# Patient Record
Sex: Male | Born: 1945 | Race: White | Hispanic: No | Marital: Married | Smoking: Former smoker
Health system: Southern US, Community
[De-identification: ages and names within clinical notes are randomized; demographics above are authoritative.]

## PROBLEM LIST (undated history)

## (undated) DIAGNOSIS — H532 Diplopia: Secondary | ICD-10-CM

## (undated) DIAGNOSIS — J189 Pneumonia, unspecified organism: Secondary | ICD-10-CM

## (undated) DIAGNOSIS — K219 Gastro-esophageal reflux disease without esophagitis: Secondary | ICD-10-CM

## (undated) DIAGNOSIS — E785 Hyperlipidemia, unspecified: Secondary | ICD-10-CM

## (undated) DIAGNOSIS — I10 Essential (primary) hypertension: Secondary | ICD-10-CM

## (undated) DIAGNOSIS — M1990 Unspecified osteoarthritis, unspecified site: Secondary | ICD-10-CM

## (undated) DIAGNOSIS — H269 Unspecified cataract: Secondary | ICD-10-CM

## (undated) DIAGNOSIS — H348312 Tributary (branch) retinal vein occlusion, right eye, stable: Secondary | ICD-10-CM

## (undated) DIAGNOSIS — G459 Transient cerebral ischemic attack, unspecified: Secondary | ICD-10-CM

## (undated) HISTORY — DX: Gastro-esophageal reflux disease without esophagitis: K21.9

## (undated) HISTORY — DX: Transient cerebral ischemic attack, unspecified: G45.9

## (undated) HISTORY — PX: SMALL INTESTINE SURGERY: SHX150

## (undated) HISTORY — PX: APPENDECTOMY: SHX54

## (undated) HISTORY — PX: CARDIAC CATHETERIZATION: SHX172

## (undated) HISTORY — DX: Unspecified cataract: H26.9

## (undated) HISTORY — PX: INGUINAL HERNIA REPAIR: SUR1180

## (undated) HISTORY — PX: CATARACT EXTRACTION: SUR2

## (undated) HISTORY — DX: Diplopia: H53.2

## (undated) HISTORY — PX: SPINE SURGERY: SHX786

## (undated) HISTORY — DX: Tributary (branch) retinal vein occlusion, right eye, stable: H34.8312

## (undated) HISTORY — PX: TONSILLECTOMY: SUR1361

## (undated) HISTORY — DX: Hyperlipidemia, unspecified: E78.5

## (undated) HISTORY — DX: Essential (primary) hypertension: I10

## (undated) HISTORY — PX: EYE SURGERY: SHX253

---
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
## 2008-01-29 DIAGNOSIS — Z87442 Personal history of urinary calculi: Secondary | ICD-10-CM | POA: Insufficient documentation

## 2008-01-29 HISTORY — DX: Personal history of urinary calculi: Z87.442

## 2013-12-07 HISTORY — PX: CARDIAC CATHETERIZATION: SHX172

## 2015-03-01 DIAGNOSIS — Z23 Encounter for immunization: Secondary | ICD-10-CM | POA: Diagnosis not present

## 2015-04-18 DIAGNOSIS — Z125 Encounter for screening for malignant neoplasm of prostate: Secondary | ICD-10-CM | POA: Diagnosis not present

## 2015-04-28 DIAGNOSIS — S20219A Contusion of unspecified front wall of thorax, initial encounter: Secondary | ICD-10-CM | POA: Diagnosis not present

## 2015-05-19 DIAGNOSIS — I10 Essential (primary) hypertension: Secondary | ICD-10-CM

## 2015-05-19 DIAGNOSIS — K219 Gastro-esophageal reflux disease without esophagitis: Secondary | ICD-10-CM

## 2015-05-19 DIAGNOSIS — Z566 Other physical and mental strain related to work: Secondary | ICD-10-CM | POA: Diagnosis not present

## 2015-05-19 DIAGNOSIS — M4806 Spinal stenosis, lumbar region: Secondary | ICD-10-CM | POA: Diagnosis not present

## 2015-05-19 HISTORY — DX: Essential (primary) hypertension: I10

## 2015-05-19 HISTORY — DX: Gastro-esophageal reflux disease without esophagitis: K21.9

## 2015-09-20 DIAGNOSIS — M19072 Primary osteoarthritis, left ankle and foot: Secondary | ICD-10-CM | POA: Diagnosis not present

## 2015-09-20 DIAGNOSIS — M7062 Trochanteric bursitis, left hip: Secondary | ICD-10-CM | POA: Diagnosis not present

## 2015-11-20 DIAGNOSIS — N200 Calculus of kidney: Secondary | ICD-10-CM | POA: Diagnosis not present

## 2015-11-20 DIAGNOSIS — Z23 Encounter for immunization: Secondary | ICD-10-CM | POA: Diagnosis not present

## 2015-11-20 DIAGNOSIS — Z733 Stress, not elsewhere classified: Secondary | ICD-10-CM | POA: Diagnosis not present

## 2015-11-20 DIAGNOSIS — K219 Gastro-esophageal reflux disease without esophagitis: Secondary | ICD-10-CM | POA: Diagnosis not present

## 2015-11-20 DIAGNOSIS — I10 Essential (primary) hypertension: Secondary | ICD-10-CM | POA: Diagnosis not present

## 2015-12-26 DIAGNOSIS — J209 Acute bronchitis, unspecified: Secondary | ICD-10-CM | POA: Diagnosis not present

## 2016-03-22 DIAGNOSIS — Z125 Encounter for screening for malignant neoplasm of prostate: Secondary | ICD-10-CM | POA: Diagnosis not present

## 2016-03-22 DIAGNOSIS — I10 Essential (primary) hypertension: Secondary | ICD-10-CM | POA: Diagnosis not present

## 2016-05-03 DIAGNOSIS — M25562 Pain in left knee: Secondary | ICD-10-CM | POA: Diagnosis not present

## 2016-05-06 DIAGNOSIS — M25562 Pain in left knee: Secondary | ICD-10-CM | POA: Diagnosis not present

## 2016-05-20 DIAGNOSIS — R310 Gross hematuria: Secondary | ICD-10-CM | POA: Diagnosis not present

## 2016-05-20 DIAGNOSIS — N3001 Acute cystitis with hematuria: Secondary | ICD-10-CM | POA: Diagnosis not present

## 2016-05-22 DIAGNOSIS — E278 Other specified disorders of adrenal gland: Secondary | ICD-10-CM | POA: Diagnosis not present

## 2016-05-22 DIAGNOSIS — I517 Cardiomegaly: Secondary | ICD-10-CM | POA: Diagnosis not present

## 2016-05-22 DIAGNOSIS — M5136 Other intervertebral disc degeneration, lumbar region: Secondary | ICD-10-CM | POA: Diagnosis not present

## 2016-05-22 DIAGNOSIS — I700 Atherosclerosis of aorta: Secondary | ICD-10-CM | POA: Diagnosis not present

## 2016-05-22 DIAGNOSIS — R310 Gross hematuria: Secondary | ICD-10-CM | POA: Diagnosis not present

## 2016-05-22 DIAGNOSIS — M47897 Other spondylosis, lumbosacral region: Secondary | ICD-10-CM | POA: Diagnosis not present

## 2016-05-22 DIAGNOSIS — M47896 Other spondylosis, lumbar region: Secondary | ICD-10-CM | POA: Diagnosis not present

## 2016-05-22 DIAGNOSIS — M5137 Other intervertebral disc degeneration, lumbosacral region: Secondary | ICD-10-CM | POA: Diagnosis not present

## 2016-05-27 DIAGNOSIS — N3021 Other chronic cystitis with hematuria: Secondary | ICD-10-CM | POA: Diagnosis not present

## 2016-05-27 DIAGNOSIS — R310 Gross hematuria: Secondary | ICD-10-CM | POA: Diagnosis not present

## 2016-06-18 DIAGNOSIS — N3001 Acute cystitis with hematuria: Secondary | ICD-10-CM | POA: Diagnosis not present

## 2016-06-18 DIAGNOSIS — R310 Gross hematuria: Secondary | ICD-10-CM | POA: Diagnosis not present

## 2016-06-18 DIAGNOSIS — N318 Other neuromuscular dysfunction of bladder: Secondary | ICD-10-CM | POA: Diagnosis not present

## 2016-06-18 DIAGNOSIS — N410 Acute prostatitis: Secondary | ICD-10-CM | POA: Diagnosis not present

## 2016-07-02 DIAGNOSIS — N3001 Acute cystitis with hematuria: Secondary | ICD-10-CM | POA: Diagnosis not present

## 2016-07-02 DIAGNOSIS — R310 Gross hematuria: Secondary | ICD-10-CM | POA: Diagnosis not present

## 2016-07-29 DIAGNOSIS — M79675 Pain in left toe(s): Secondary | ICD-10-CM | POA: Diagnosis not present

## 2016-09-26 DIAGNOSIS — I10 Essential (primary) hypertension: Secondary | ICD-10-CM | POA: Diagnosis not present

## 2016-09-26 DIAGNOSIS — K219 Gastro-esophageal reflux disease without esophagitis: Secondary | ICD-10-CM | POA: Diagnosis not present

## 2016-09-26 DIAGNOSIS — Z9181 History of falling: Secondary | ICD-10-CM | POA: Diagnosis not present

## 2016-09-26 DIAGNOSIS — Z1389 Encounter for screening for other disorder: Secondary | ICD-10-CM | POA: Diagnosis not present

## 2016-10-09 DIAGNOSIS — R2231 Localized swelling, mass and lump, right upper limb: Secondary | ICD-10-CM | POA: Diagnosis not present

## 2016-10-16 DIAGNOSIS — R2231 Localized swelling, mass and lump, right upper limb: Secondary | ICD-10-CM | POA: Diagnosis not present

## 2016-10-21 DIAGNOSIS — K219 Gastro-esophageal reflux disease without esophagitis: Secondary | ICD-10-CM | POA: Diagnosis not present

## 2016-11-04 DIAGNOSIS — H348312 Tributary (branch) retinal vein occlusion, right eye, stable: Secondary | ICD-10-CM | POA: Diagnosis not present

## 2016-11-04 DIAGNOSIS — H3589 Other specified retinal disorders: Secondary | ICD-10-CM | POA: Diagnosis not present

## 2016-11-04 DIAGNOSIS — H25813 Combined forms of age-related cataract, bilateral: Secondary | ICD-10-CM | POA: Diagnosis not present

## 2016-11-04 DIAGNOSIS — Z961 Presence of intraocular lens: Secondary | ICD-10-CM | POA: Diagnosis not present

## 2016-11-04 DIAGNOSIS — H25812 Combined forms of age-related cataract, left eye: Secondary | ICD-10-CM | POA: Diagnosis not present

## 2016-11-04 DIAGNOSIS — Z9841 Cataract extraction status, right eye: Secondary | ICD-10-CM | POA: Diagnosis not present

## 2016-11-15 DIAGNOSIS — Z6832 Body mass index (BMI) 32.0-32.9, adult: Secondary | ICD-10-CM | POA: Diagnosis not present

## 2016-11-15 DIAGNOSIS — Z125 Encounter for screening for malignant neoplasm of prostate: Secondary | ICD-10-CM | POA: Diagnosis not present

## 2016-11-15 DIAGNOSIS — I10 Essential (primary) hypertension: Secondary | ICD-10-CM | POA: Diagnosis not present

## 2016-11-15 DIAGNOSIS — Z1339 Encounter for screening examination for other mental health and behavioral disorders: Secondary | ICD-10-CM | POA: Diagnosis not present

## 2016-11-15 DIAGNOSIS — K219 Gastro-esophageal reflux disease without esophagitis: Secondary | ICD-10-CM | POA: Diagnosis not present

## 2016-11-15 DIAGNOSIS — Z79899 Other long term (current) drug therapy: Secondary | ICD-10-CM | POA: Diagnosis not present

## 2016-11-15 DIAGNOSIS — Z23 Encounter for immunization: Secondary | ICD-10-CM | POA: Diagnosis not present

## 2016-11-22 DIAGNOSIS — Z136 Encounter for screening for cardiovascular disorders: Secondary | ICD-10-CM | POA: Diagnosis not present

## 2016-11-22 DIAGNOSIS — Z8249 Family history of ischemic heart disease and other diseases of the circulatory system: Secondary | ICD-10-CM | POA: Diagnosis not present

## 2016-11-22 DIAGNOSIS — Z720 Tobacco use: Secondary | ICD-10-CM | POA: Diagnosis not present

## 2016-11-22 DIAGNOSIS — Z87891 Personal history of nicotine dependence: Secondary | ICD-10-CM | POA: Diagnosis not present

## 2016-11-26 DIAGNOSIS — Z881 Allergy status to other antibiotic agents status: Secondary | ICD-10-CM | POA: Diagnosis not present

## 2016-11-26 DIAGNOSIS — H02831 Dermatochalasis of right upper eyelid: Secondary | ICD-10-CM | POA: Diagnosis not present

## 2016-11-26 DIAGNOSIS — Z880 Allergy status to penicillin: Secondary | ICD-10-CM | POA: Diagnosis not present

## 2016-11-26 DIAGNOSIS — Z79899 Other long term (current) drug therapy: Secondary | ICD-10-CM | POA: Diagnosis not present

## 2016-11-26 DIAGNOSIS — I10 Essential (primary) hypertension: Secondary | ICD-10-CM | POA: Diagnosis not present

## 2016-11-26 DIAGNOSIS — Z9841 Cataract extraction status, right eye: Secondary | ICD-10-CM | POA: Diagnosis not present

## 2016-11-26 DIAGNOSIS — H43813 Vitreous degeneration, bilateral: Secondary | ICD-10-CM | POA: Diagnosis not present

## 2016-11-26 DIAGNOSIS — H348312 Tributary (branch) retinal vein occlusion, right eye, stable: Secondary | ICD-10-CM | POA: Diagnosis not present

## 2016-11-26 DIAGNOSIS — H02834 Dermatochalasis of left upper eyelid: Secondary | ICD-10-CM | POA: Diagnosis not present

## 2016-11-26 DIAGNOSIS — H25012 Cortical age-related cataract, left eye: Secondary | ICD-10-CM | POA: Diagnosis not present

## 2016-11-26 DIAGNOSIS — H25813 Combined forms of age-related cataract, bilateral: Secondary | ICD-10-CM | POA: Diagnosis not present

## 2016-11-26 DIAGNOSIS — Z7982 Long term (current) use of aspirin: Secondary | ICD-10-CM | POA: Diagnosis not present

## 2016-11-26 DIAGNOSIS — Z87891 Personal history of nicotine dependence: Secondary | ICD-10-CM | POA: Diagnosis not present

## 2016-11-26 DIAGNOSIS — Z961 Presence of intraocular lens: Secondary | ICD-10-CM | POA: Diagnosis not present

## 2016-12-03 DIAGNOSIS — Z9049 Acquired absence of other specified parts of digestive tract: Secondary | ICD-10-CM | POA: Diagnosis not present

## 2016-12-03 DIAGNOSIS — K635 Polyp of colon: Secondary | ICD-10-CM | POA: Diagnosis not present

## 2016-12-03 DIAGNOSIS — K5730 Diverticulosis of large intestine without perforation or abscess without bleeding: Secondary | ICD-10-CM | POA: Diagnosis not present

## 2016-12-03 DIAGNOSIS — Z86010 Personal history of colonic polyps: Secondary | ICD-10-CM | POA: Diagnosis not present

## 2016-12-03 DIAGNOSIS — K648 Other hemorrhoids: Secondary | ICD-10-CM | POA: Diagnosis not present

## 2016-12-03 DIAGNOSIS — D126 Benign neoplasm of colon, unspecified: Secondary | ICD-10-CM | POA: Diagnosis not present

## 2016-12-03 DIAGNOSIS — Z79899 Other long term (current) drug therapy: Secondary | ICD-10-CM | POA: Diagnosis not present

## 2016-12-03 DIAGNOSIS — Z1211 Encounter for screening for malignant neoplasm of colon: Secondary | ICD-10-CM | POA: Diagnosis not present

## 2016-12-03 DIAGNOSIS — I10 Essential (primary) hypertension: Secondary | ICD-10-CM | POA: Diagnosis not present

## 2016-12-03 DIAGNOSIS — K644 Residual hemorrhoidal skin tags: Secondary | ICD-10-CM | POA: Diagnosis not present

## 2017-01-07 DIAGNOSIS — J209 Acute bronchitis, unspecified: Secondary | ICD-10-CM | POA: Diagnosis not present

## 2017-01-07 DIAGNOSIS — J069 Acute upper respiratory infection, unspecified: Secondary | ICD-10-CM | POA: Diagnosis not present

## 2017-01-07 DIAGNOSIS — G4700 Insomnia, unspecified: Secondary | ICD-10-CM | POA: Diagnosis not present

## 2017-01-07 DIAGNOSIS — Z6832 Body mass index (BMI) 32.0-32.9, adult: Secondary | ICD-10-CM | POA: Diagnosis not present

## 2017-01-14 DIAGNOSIS — I10 Essential (primary) hypertension: Secondary | ICD-10-CM | POA: Diagnosis not present

## 2017-01-14 DIAGNOSIS — Z6831 Body mass index (BMI) 31.0-31.9, adult: Secondary | ICD-10-CM | POA: Diagnosis not present

## 2017-01-14 DIAGNOSIS — J209 Acute bronchitis, unspecified: Secondary | ICD-10-CM | POA: Diagnosis not present

## 2017-02-12 DIAGNOSIS — D179 Benign lipomatous neoplasm, unspecified: Secondary | ICD-10-CM | POA: Diagnosis not present

## 2017-02-12 DIAGNOSIS — R2231 Localized swelling, mass and lump, right upper limb: Secondary | ICD-10-CM | POA: Diagnosis not present

## 2017-03-10 DIAGNOSIS — K219 Gastro-esophageal reflux disease without esophagitis: Secondary | ICD-10-CM | POA: Diagnosis not present

## 2017-03-10 DIAGNOSIS — I10 Essential (primary) hypertension: Secondary | ICD-10-CM | POA: Diagnosis not present

## 2017-03-10 DIAGNOSIS — Z7982 Long term (current) use of aspirin: Secondary | ICD-10-CM | POA: Diagnosis not present

## 2017-03-10 DIAGNOSIS — D179 Benign lipomatous neoplasm, unspecified: Secondary | ICD-10-CM | POA: Diagnosis not present

## 2017-03-10 DIAGNOSIS — Z79899 Other long term (current) drug therapy: Secondary | ICD-10-CM | POA: Diagnosis not present

## 2017-03-10 DIAGNOSIS — Z6830 Body mass index (BMI) 30.0-30.9, adult: Secondary | ICD-10-CM | POA: Diagnosis not present

## 2017-03-10 DIAGNOSIS — D1721 Benign lipomatous neoplasm of skin and subcutaneous tissue of right arm: Secondary | ICD-10-CM | POA: Diagnosis not present

## 2017-03-10 DIAGNOSIS — Z87891 Personal history of nicotine dependence: Secondary | ICD-10-CM | POA: Diagnosis not present

## 2017-03-10 DIAGNOSIS — E669 Obesity, unspecified: Secondary | ICD-10-CM | POA: Diagnosis not present

## 2017-05-07 DIAGNOSIS — Z9841 Cataract extraction status, right eye: Secondary | ICD-10-CM | POA: Diagnosis not present

## 2017-05-07 DIAGNOSIS — H348312 Tributary (branch) retinal vein occlusion, right eye, stable: Secondary | ICD-10-CM | POA: Diagnosis not present

## 2017-05-07 DIAGNOSIS — H25812 Combined forms of age-related cataract, left eye: Secondary | ICD-10-CM | POA: Diagnosis not present

## 2017-05-07 DIAGNOSIS — Z961 Presence of intraocular lens: Secondary | ICD-10-CM | POA: Diagnosis not present

## 2017-05-07 DIAGNOSIS — Z9842 Cataract extraction status, left eye: Secondary | ICD-10-CM | POA: Diagnosis not present

## 2017-05-08 DIAGNOSIS — R002 Palpitations: Secondary | ICD-10-CM | POA: Diagnosis not present

## 2017-05-08 DIAGNOSIS — R9431 Abnormal electrocardiogram [ECG] [EKG]: Secondary | ICD-10-CM | POA: Diagnosis not present

## 2017-05-08 DIAGNOSIS — Z6831 Body mass index (BMI) 31.0-31.9, adult: Secondary | ICD-10-CM | POA: Diagnosis not present

## 2017-05-12 ENCOUNTER — Encounter: Payer: Self-pay | Admitting: Cardiology

## 2017-05-12 ENCOUNTER — Ambulatory Visit (INDEPENDENT_AMBULATORY_CARE_PROVIDER_SITE_OTHER): Payer: Medicare Other | Admitting: Cardiology

## 2017-05-12 VITALS — BP 134/90 | HR 74 | Ht 72.0 in | Wt 233.4 lb

## 2017-05-12 DIAGNOSIS — R0789 Other chest pain: Secondary | ICD-10-CM

## 2017-05-12 DIAGNOSIS — R9431 Abnormal electrocardiogram [ECG] [EKG]: Secondary | ICD-10-CM | POA: Diagnosis not present

## 2017-05-12 DIAGNOSIS — R002 Palpitations: Secondary | ICD-10-CM | POA: Diagnosis not present

## 2017-05-12 DIAGNOSIS — I10 Essential (primary) hypertension: Secondary | ICD-10-CM | POA: Diagnosis not present

## 2017-05-12 HISTORY — DX: Abnormal electrocardiogram (ECG) (EKG): R94.31

## 2017-05-12 HISTORY — DX: Other chest pain: R07.89

## 2017-05-12 HISTORY — DX: Palpitations: R00.2

## 2017-05-12 NOTE — Patient Instructions (Signed)
Medication Instructions:  Your physician recommends that you continue on your current medications as directed. Please refer to the Current Medication list given to you today.   Labwork: None  Testing/Procedures: Your physician has requested that you have an echocardiogram. Echocardiography is a painless test that uses sound waves to create images of your heart. It provides your doctor with information about the size and shape of your heart and how well your heart's chambers and valves are working. This procedure takes approximately one hour. There are no restrictions for this procedure.  Your physician has recommended that you wear a holter monitor. Holter monitors are medical devices that record the heart's electrical activity. Doctors most often use these monitors to diagnose arrhythmias. Arrhythmias are problems with the speed or rhythm of the heartbeat. The monitor is a small, portable device. You can wear one while you do your normal daily activities. This is usually used to diagnose what is causing palpitations/syncope (passing out). Wear for 24 hours.   Follow-Up: Your physician recommends that you schedule a follow-up appointment in: 1 month.   Any Other Special Instructions Will Be Listed Below (If Applicable).     If you need a refill on your cardiac medications before your next appointment, please call your pharmacy.

## 2017-05-12 NOTE — Progress Notes (Signed)
Cardiology Consultation:    Date:  05/12/2017   ID:  Clarence Hill, DOB [birth year], MRN [id number]  PCP:  Angelina Sheriff, MD  Cardiologist:  Jenne Campus, MD   Referring MD: No ref. provider found   Chief Complaint  Patient presents with  . Abnormal ECG  I have palpitations and abnormal EKG  History of Present Illness:    Clarence Hill is a 72 y.o. male who is being seen today for the evaluation of palpitations and abnormal EKG at the request of No ref. provider found.  For last few weeks he experienced palpitations what he means by that he can feel hesitation in his heart beating and then after that he had very strong, powerful beat.  That happens typically at evening time he is trying to relax he does not have any chest pain no tightness squeezing pressure burning chest but he feels somewhat uncomfortable in the chest.  He did have quite extensive cardiac workup done about 3 years ago that included cardiac catheterization he said it was okay he is not sure if his cardiac catheterization was normal or if he did have some luminal disease.  He does not exercise on the regular basis he is busy after he retired about a month ago.  Used to go to gym however lately does not have time to do it.  Does not snore at night.  Does have borderline hypertension.  His cholesterol he is telling me that his LDL is usually above 60.  He used to smoke but quit smoking many years ago when his wife was pregnant with the first child.  He does have family history of premature coronary disease no dizziness no syncope  Past Medical History:  Diagnosis Date  . GERD (gastroesophageal reflux disease)   . Hypertension       Current Medications: Current Meds  Medication Sig  . aspirin EC 81 MG tablet Take 1 tablet by mouth daily.  Marland Kitchen losartan-hydrochlorothiazide (HYZAAR) 100-25 MG tablet Take 1 tablet by mouth daily.  Marland Kitchen omeprazole (PRILOSEC) 40 MG capsule Take 1 capsule by mouth daily.  . vitamin  B-12 (CYANOCOBALAMIN) 1000 MCG tablet Take 1 tablet by mouth 2 (two) times daily.  Marland Kitchen zolpidem (AMBIEN) 10 MG tablet Take 1 tablet by mouth at bedtime as needed.     Allergies:   Levofloxacin and Penicillin g   Social History   Socioeconomic History  . Marital status: Unknown    Spouse name: Not on file  . Number of children: Not on file  . Years of education: Not on file  . Highest education level: Not on file  Occupational History  . Not on file  Social Needs  . Financial resource strain: Not on file  . Food insecurity:    Worry: Not on file    Inability: Not on file  . Transportation needs:    Medical: Not on file    Non-medical: Not on file  Tobacco Use  . Smoking status: Former Research scientist (life sciences)  . Smokeless tobacco: Never Used  Substance and Sexual Activity  . Alcohol use: Yes  . Drug use: Not on file  . Sexual activity: Not on file  Lifestyle  . Physical activity:    Days per week: Not on file    Minutes per session: Not on file  . Stress: Not on file  Relationships  . Social connections:    Talks on phone: Not on file    Gets together: Not on file  Attends religious service: Not on file    Active member of club or organization: Not on file    Attends meetings of clubs or organizations: Not on file    Relationship status: Not on file  Other Topics Concern  . Not on file  Social History Narrative  . Not on file     Family History: The patient's family history includes CAD in his father; Thyroid cancer in his mother. ROS:   Please see the history of present illness.    All 14 point review of systems negative except as described per history of present illness.  EKGs/Labs/Other Studies Reviewed:    The following studies were reviewed today: EKG done by primary care physicians on normal sinus rhythm, left axis deviation, nonspecific ST segment changes   Recent Labs: No results found for requested labs within last 8760 hours.  Recent Lipid Panel No results  found for: CHOL, TRIG, HDL, CHOLHDL, VLDL, LDLCALC, LDLDIRECT  Physical Exam:    VS:  BP 134/90   Pulse 74   Ht 6' (1.829 m)   Wt 233 lb 6.4 oz (105.9 kg)   SpO2 95%   BMI 31.65 kg/m     Wt Readings from Last 3 Encounters:  05/12/17 233 lb 6.4 oz (105.9 kg)     GEN:  Well nourished, well developed in no acute distress HEENT: Normal NECK: No JVD; No carotid bruits LYMPHATICS: No lymphadenopathy CARDIAC: RRR, no murmurs, no rubs, no gallops RESPIRATORY:  Clear to auscultation without rales, wheezing or rhonchi  ABDOMEN: Soft, non-tender, non-distended MUSCULOSKELETAL:  No edema; No deformity  SKIN: Warm and dry NEUROLOGIC:  Alert and oriented x 3 PSYCHIATRIC:  Normal affect   ASSESSMENT:    1. Benign essential hypertension   2. Palpitations   3. Atypical chest pain   4. Abnormal EKG    PLAN:    In order of problems listed above:  1. Benign essential hypertension.  This is something we will get a watch.  Today blood pressure is acceptable. 2. Palpitations: I suspect he got extrasystole.  I will schedule him to have Holter monitor for 24 hours to see how much and what kind of extrasystole he is experiencing.  As a part of evaluation I will ask him to have echocardiogram. 3. Atypical chest pain: We will try to get cardiac catheterization that was done 2-3 years ago.  Based on that as well as based on the results of his Holter and echocardiogram will make decision regarding CAD evaluation. 4. Abnormal EKG.  We will do echocardiogram.  I will not initiate any medications for now.  His medication appropriate for this clinical scenario.  We will see him back in about a month   Medication Adjustments/Labs and Tests Ordered: Current medicines are reviewed at length with the patient today.  Concerns regarding medicines are outlined above.  No orders of the defined types were placed in this encounter.  No orders of the defined types were placed in this  encounter.   Signed, Park Liter, MD, [facility]. 05/12/2017 5:00 PM    [place]

## 2017-06-04 ENCOUNTER — Ambulatory Visit: Payer: Medicare Other

## 2017-06-04 ENCOUNTER — Ambulatory Visit (HOSPITAL_BASED_OUTPATIENT_CLINIC_OR_DEPARTMENT_OTHER)
Admission: RE | Admit: 2017-06-04 | Discharge: 2017-06-04 | Disposition: A | Discharge status: Home / Self Care | Payer: Medicare Other | Source: Ambulatory Visit | Attending: Cardiology | Admitting: Cardiology

## 2017-06-04 DIAGNOSIS — R002 Palpitations: Secondary | ICD-10-CM | POA: Insufficient documentation

## 2017-06-04 DIAGNOSIS — I351 Nonrheumatic aortic (valve) insufficiency: Secondary | ICD-10-CM | POA: Diagnosis not present

## 2017-06-04 DIAGNOSIS — R0789 Other chest pain: Secondary | ICD-10-CM | POA: Insufficient documentation

## 2017-06-04 DIAGNOSIS — R9431 Abnormal electrocardiogram [ECG] [EKG]: Secondary | ICD-10-CM | POA: Insufficient documentation

## 2017-06-04 DIAGNOSIS — I10 Essential (primary) hypertension: Secondary | ICD-10-CM | POA: Insufficient documentation

## 2017-06-04 NOTE — Progress Notes (Signed)
  Echocardiogram 2D Echocardiogram has been performed.  Clarence Hill T Clarence Hill 06/04/2017, 12:18 PM

## 2017-06-06 ENCOUNTER — Other Ambulatory Visit (HOSPITAL_BASED_OUTPATIENT_CLINIC_OR_DEPARTMENT_OTHER): Payer: Medicare Other

## 2017-06-16 ENCOUNTER — Ambulatory Visit: Payer: Medicare Other | Admitting: Cardiology

## 2017-06-17 ENCOUNTER — Encounter: Payer: Self-pay | Admitting: Cardiology

## 2017-06-17 ENCOUNTER — Ambulatory Visit (INDEPENDENT_AMBULATORY_CARE_PROVIDER_SITE_OTHER): Payer: Medicare Other | Admitting: Cardiology

## 2017-06-17 VITALS — BP 126/74 | HR 92 | Ht 72.0 in | Wt 231.8 lb

## 2017-06-17 DIAGNOSIS — I10 Essential (primary) hypertension: Secondary | ICD-10-CM | POA: Diagnosis not present

## 2017-06-17 DIAGNOSIS — R0789 Other chest pain: Secondary | ICD-10-CM

## 2017-06-17 DIAGNOSIS — R002 Palpitations: Secondary | ICD-10-CM

## 2017-06-17 DIAGNOSIS — K219 Gastro-esophageal reflux disease without esophagitis: Secondary | ICD-10-CM

## 2017-06-17 NOTE — Patient Instructions (Signed)
Medication Instructions:  Your physician recommends that you continue on your current medications as directed. Please refer to the Current Medication list given to you today.   Labwork: None  Testing/Procedures: Your physician has requested that you have a stress echocardiogram. For further information please visit [email or web address]. Please follow instruction sheet as given.   Follow-Up: Your physician wants you to follow-up in: 3 months. You will receive a reminder letter in the mail two months in advance. If you don't receive a letter, please call our office to schedule the follow-up appointment.   If you need a refill on your cardiac medications before your next appointment, please call your pharmacy.   Thank you for choosing [facility]! Robyne Peers, RN [phone]

## 2017-06-17 NOTE — Progress Notes (Signed)
Cardiology Office Note:    Date:  06/17/2017   ID:  Clarence Hill, DOB [birth year], MRN [id number]  PCP:  Clarence Sheriff, MD  Cardiologist:  Clarence Campus, MD    Referring MD: Clarence Sheriff, MD   Chief Complaint  Patient presents with  . 1 month follow up  Doing well  History of Present Illness:    Clarence Hill is a 72 y.o. male with hypertension, palpitations comes for follow-up.  Did wear Holter monitor which shows 75 premature ventricular beats no sustained arrhythmia no couplets and triplets.  Still feels some palpitations but does not bother him too much.  Echocardiogram was done which showed preserved left ventricular ejection fraction.  Interestingly when he wear Holter monitor at the heart fastest heart rate And at 7:00 in the morning he did have some ST segment depressions.  There is no chest pain tightness squeezing pressure burning chest however changes on EKG identified on the Holter and worrisome.  I will schedule him to have stress echocardiogram to rule out significant ischemia.  He tells me again that he had cardiac catheterization done before which showed normal coronaries still I do not have report of it we will try to retrieve it.  Past Medical History:  Diagnosis Date  . GERD (gastroesophageal reflux disease)   . Hypertension       Current Medications: Current Meds  Medication Sig  . aspirin EC 81 MG tablet Take 1 tablet by mouth daily.  Marland Kitchen losartan-hydrochlorothiazide (HYZAAR) 100-25 MG tablet Take 1 tablet by mouth daily.  Marland Kitchen omeprazole (PRILOSEC) 40 MG capsule Take 1 capsule by mouth daily.  . vitamin B-12 (CYANOCOBALAMIN) 1000 MCG tablet Take 1 tablet by mouth 2 (two) times daily.  Marland Kitchen zolpidem (AMBIEN) 10 MG tablet Take 1 tablet by mouth at bedtime as needed.     Allergies:   Levofloxacin and Penicillin g   Social History   Socioeconomic History  . Marital status: Married    Spouse name: Not on file  . Number of children: Not on file    . Years of education: Not on file  . Highest education level: Not on file  Occupational History  . Not on file  Social Needs  . Financial resource strain: Not on file  . Food insecurity:    Worry: Not on file    Inability: Not on file  . Transportation needs:    Medical: Not on file    Non-medical: Not on file  Tobacco Use  . Smoking status: Former Research scientist (life sciences)  . Smokeless tobacco: Never Used  Substance and Sexual Activity  . Alcohol use: Yes  . Drug use: Not on file  . Sexual activity: Not on file  Lifestyle  . Physical activity:    Days per week: Not on file    Minutes per session: Not on file  . Stress: Not on file  Relationships  . Social connections:    Talks on phone: Not on file    Gets together: Not on file    Attends religious service: Not on file    Active member of club or organization: Not on file    Attends meetings of clubs or organizations: Not on file    Relationship status: Not on file  Other Topics Concern  . Not on file  Social History Narrative  . Not on file     Family History: The patient's family history includes CAD in his father; Thyroid cancer in his mother. ROS:  Please see the history of present illness.    All 14 point review of systems negative except as described per history of present illness  EKGs/Labs/Other Studies Reviewed:      Recent Labs: No results found for requested labs within last 8760 hours.  Recent Lipid Panel No results found for: CHOL, TRIG, HDL, CHOLHDL, VLDL, LDLCALC, LDLDIRECT  Physical Exam:    VS:  BP 126/74   Pulse 92   Ht 6' (1.829 m)   Wt 231 lb 12.8 oz (105.1 kg)   SpO2 97%   BMI 31.44 kg/m     Wt Readings from Last 3 Encounters:  06/17/17 231 lb 12.8 oz (105.1 kg)  05/12/17 233 lb 6.4 oz (105.9 kg)     GEN:  Well nourished, well developed in no acute distress HEENT: Normal NECK: No JVD; No carotid bruits LYMPHATICS: No lymphadenopathy CARDIAC: RRR, no murmurs, no rubs, no  gallops RESPIRATORY:  Clear to auscultation without rales, wheezing or rhonchi  ABDOMEN: Soft, non-tender, non-distended MUSCULOSKELETAL:  No edema; No deformity  SKIN: Warm and dry LOWER EXTREMITIES: no swelling NEUROLOGIC:  Alert and oriented x 3 PSYCHIATRIC:  Normal affect   ASSESSMENT:    1. Palpitations   2. Atypical chest pain   3. Benign essential hypertension   4. Gastroesophageal reflux disease without esophagitis    PLAN:    In order of problems listed above:  1. Palpitations the very few does not bother him at all only 74 premature ventricular beats noted within 24 hours Holter monitor.  We will simply continue monitoring.  As a part of risk stratification especially in view of the fact that he had some EKG changes on the Holter monitor we will schedule him to have stress echocardiogram. 2. Atypical chest pain: Denies having any lately.  Stress test will be done to clarify the situation 3. Essential hypertension: Blood pressure is normal continue present management 4. GERD: Denies having a problem from that point review now.  See him back in my office in about 3 months or sooner if he has a problem.  I reviewed cholesterol from his primary care physician which is excellent.   Medication Adjustments/Labs and Tests Ordered: Current medicines are reviewed at length with the patient today.  Concerns regarding medicines are outlined above.  No orders of the defined types were placed in this encounter.  Medication changes: No orders of the defined types were placed in this encounter.   Signed, Park Liter, MD, [facility] 06/17/2017 8:32 AM    [place]

## 2017-06-19 ENCOUNTER — Other Ambulatory Visit (HOSPITAL_BASED_OUTPATIENT_CLINIC_OR_DEPARTMENT_OTHER): Payer: Medicare Other

## 2017-06-26 ENCOUNTER — Ambulatory Visit: Payer: Medicare Other | Admitting: Cardiology

## 2017-07-02 ENCOUNTER — Telehealth: Payer: Self-pay | Admitting: *Deleted

## 2017-07-02 ENCOUNTER — Ambulatory Visit (HOSPITAL_BASED_OUTPATIENT_CLINIC_OR_DEPARTMENT_OTHER)
Admission: RE | Admit: 2017-07-02 | Discharge: 2017-07-02 | Disposition: A | Discharge status: Home / Self Care | Payer: Medicare Other | Source: Ambulatory Visit | Attending: Cardiology | Admitting: Cardiology

## 2017-07-02 DIAGNOSIS — R002 Palpitations: Secondary | ICD-10-CM | POA: Insufficient documentation

## 2017-07-02 DIAGNOSIS — R0789 Other chest pain: Secondary | ICD-10-CM

## 2017-07-02 DIAGNOSIS — I080 Rheumatic disorders of both mitral and aortic valves: Secondary | ICD-10-CM | POA: Insufficient documentation

## 2017-07-02 NOTE — Telephone Encounter (Signed)
Patient called stating his stress echo is today at 1:15 pm. He wanted to verify pre-testing instructions. Advised him that he could take his medications as he usually would and that he could not eat or drink four hours before testing. Patient verbalized understanding. No further questions.

## 2017-07-02 NOTE — Progress Notes (Signed)
Echocardiogram Echocardiogram Stress Test has been performed.  Joelene Millin 07/02/2017, 2:21 PM

## 2017-07-03 ENCOUNTER — Other Ambulatory Visit (HOSPITAL_BASED_OUTPATIENT_CLINIC_OR_DEPARTMENT_OTHER): Payer: Medicare Other

## 2017-08-19 ENCOUNTER — Encounter: Payer: Self-pay | Admitting: *Deleted

## 2017-08-20 DIAGNOSIS — Z0000 Encounter for general adult medical examination without abnormal findings: Secondary | ICD-10-CM | POA: Diagnosis not present

## 2017-08-20 DIAGNOSIS — W57XXXA Bitten or stung by nonvenomous insect and other nonvenomous arthropods, initial encounter: Secondary | ICD-10-CM | POA: Diagnosis not present

## 2017-08-20 DIAGNOSIS — Z6831 Body mass index (BMI) 31.0-31.9, adult: Secondary | ICD-10-CM | POA: Diagnosis not present

## 2017-08-20 DIAGNOSIS — I10 Essential (primary) hypertension: Secondary | ICD-10-CM | POA: Diagnosis not present

## 2017-09-11 DIAGNOSIS — N529 Male erectile dysfunction, unspecified: Secondary | ICD-10-CM | POA: Diagnosis not present

## 2017-09-11 DIAGNOSIS — I10 Essential (primary) hypertension: Secondary | ICD-10-CM | POA: Diagnosis not present

## 2017-09-11 DIAGNOSIS — Z6831 Body mass index (BMI) 31.0-31.9, adult: Secondary | ICD-10-CM | POA: Diagnosis not present

## 2017-09-11 DIAGNOSIS — M109 Gout, unspecified: Secondary | ICD-10-CM | POA: Diagnosis not present

## 2017-09-19 ENCOUNTER — Telehealth: Payer: Self-pay | Admitting: Cardiology

## 2017-09-19 NOTE — Telephone Encounter (Signed)
Patient had the Gout in his toe and Dr redding told him that hois BP meds could contribute to the Gout. U have scheduled patient an appt for 11/12/2017, can we please call and advise.

## 2017-09-22 NOTE — Telephone Encounter (Signed)
Left message to return call regarding medication changes Dr. Agustin Cree has recommended. Advised to stop losartan-hydrochlorothiazide 100-25 mg daily and start losartan 100 mg daily as hydrochlorothiazide can cause gout flare ups. Will inform patient when he returns call.

## 2017-09-22 NOTE — Telephone Encounter (Signed)
Left voicemail to call back to discuss

## 2017-09-23 DIAGNOSIS — M79671 Pain in right foot: Secondary | ICD-10-CM | POA: Diagnosis not present

## 2017-09-30 NOTE — Telephone Encounter (Signed)
Patient states that [name] has already made medication adjustments and he was just calling to let us know. [name] discontinued losartan-hydrochlorothiazide and started him on losartan only. Patient also reports that [name] added amlodipine to his medication regimen but could not recall the dosage. Patient states that he has not checked his blood pressure recently but last time he checked he was running around 155/88. Advised patient to continue to monitor his blood pressure daily and contact our office with further questions or concerns. Patient verbalized understanding. No further questions.

## 2017-09-30 NOTE — Telephone Encounter (Signed)
Left message to return call 

## 2017-10-16 DIAGNOSIS — I10 Essential (primary) hypertension: Secondary | ICD-10-CM | POA: Diagnosis not present

## 2017-10-16 DIAGNOSIS — Z6831 Body mass index (BMI) 31.0-31.9, adult: Secondary | ICD-10-CM | POA: Diagnosis not present

## 2017-10-16 DIAGNOSIS — Z23 Encounter for immunization: Secondary | ICD-10-CM | POA: Diagnosis not present

## 2017-10-16 DIAGNOSIS — Z9181 History of falling: Secondary | ICD-10-CM | POA: Diagnosis not present

## 2017-10-16 DIAGNOSIS — H5789 Other specified disorders of eye and adnexa: Secondary | ICD-10-CM | POA: Diagnosis not present

## 2017-10-27 ENCOUNTER — Other Ambulatory Visit: Payer: Self-pay

## 2017-10-27 ENCOUNTER — Ambulatory Visit (INDEPENDENT_AMBULATORY_CARE_PROVIDER_SITE_OTHER): Payer: Medicare Other

## 2017-10-27 ENCOUNTER — Ambulatory Visit (INDEPENDENT_AMBULATORY_CARE_PROVIDER_SITE_OTHER): Payer: Medicare Other | Admitting: Podiatry

## 2017-10-27 DIAGNOSIS — M79674 Pain in right toe(s): Secondary | ICD-10-CM | POA: Diagnosis not present

## 2017-10-27 DIAGNOSIS — M779 Enthesopathy, unspecified: Secondary | ICD-10-CM | POA: Diagnosis not present

## 2017-10-27 DIAGNOSIS — M109 Gout, unspecified: Secondary | ICD-10-CM

## 2017-10-27 DIAGNOSIS — M2011 Hallux valgus (acquired), right foot: Secondary | ICD-10-CM | POA: Diagnosis not present

## 2017-10-27 DIAGNOSIS — M21611 Bunion of right foot: Secondary | ICD-10-CM

## 2017-10-27 MED ORDER — COLCHICINE 0.6 MG PO TABS: 0.6000 mg | ORAL_TABLET | Freq: Every day | ORAL | 0 refills | Status: DC

## 2017-10-27 NOTE — Progress Notes (Signed)
Subjective:  Patient ID: Clarence Hill, male    DOB: Oct 26, 1945,  MRN: [id number]  No chief complaint on file.   72 y.o. male presents with right great toe pain. Reports swelling and pain to the right great toe.  Present since Friday.  Has had this issue before states that it started about a month ago but was getting worse as of Friday.  Denies injury.  Reports a 10 sharp pain.  States that the pain was so bad on Friday that he had pain from the sheet.  Was seen by his PCP for this issue was started on allopurinol and indomethacin.  Review of Systems: Negative except as noted in the HPI. Denies N/V/F/Ch.  Past Medical History:  Diagnosis Date  . GERD (gastroesophageal reflux disease)   . Hypertension     Current Outpatient Medications:  .  allopurinol (ZYLOPRIM) 100 MG tablet, Take 100 mg by mouth daily., Disp: , Rfl: 1 .  amLODipine (NORVASC) 5 MG tablet, Take 5 mg by mouth daily., Disp: , Rfl: 2 .  aspirin EC 81 MG tablet, Take 1 tablet by mouth daily., Disp: , Rfl:  .  colchicine 0.6 MG tablet, Take 1 tablet (0.6 mg total) by mouth daily. As needed during gout flare, Disp: 30 tablet, Rfl: 0 .  hydrocortisone (ANUSOL-HC) 2.5 % rectal cream, Proctosol HC 2.5 % topical cream perineal applicator, Disp: , Rfl:  .  indomethacin (INDOCIN) 50 MG capsule, TAKE ONE CAPSULE BY MOUTH 3 TIMES A DAY WITH MEALS AS NEEDED, Disp: , Rfl: 1 .  losartan (COZAAR) 100 MG tablet, TAKE 1 TABLET BY MOUTH DAILY IN THE MORNING, Disp: , Rfl: 2 .  losartan-hydrochlorothiazide (HYZAAR) 100-25 MG tablet, Take 1 tablet by mouth daily., Disp: , Rfl:  .  Multiple Vitamins-Minerals (MULTIVITAMIN) LIQD, Multi Vitamin  1 QD, Disp: , Rfl:  .  omeprazole (PRILOSEC) 40 MG capsule, Take 1 capsule by mouth daily., Disp: , Rfl:  .  traMADol (ULTRAM) 50 MG tablet, Take 50 mg by mouth 2 (two) times daily as needed. for pain, Disp: , Rfl: 0 .  vitamin B-12 (CYANOCOBALAMIN) 1000 MCG tablet, Take 1 tablet by mouth 2 (two) times  daily., Disp: , Rfl:  .  zolpidem (AMBIEN) 10 MG tablet, Take 1 tablet by mouth at bedtime as needed., Disp: , Rfl:   Social History   Tobacco Use  Smoking Status Former Smoker  Smokeless Tobacco Never Used    Allergies  Allergen Reactions  . Levofloxacin Other (See Comments)  . Penicillin G Other (See Comments) and Rash    Unknown Unknown  . Penicillin G Benzathine & Proc Rash   Objective:  There were no vitals filed for this visit. There is no height or weight on file to calculate BMI. Constitutional Well developed. Well nourished.  Vascular Dorsalis pedis pulses palpable bilaterally. Posterior tibial pulses palpable bilaterally. Capillary refill normal to all digits.  No cyanosis or clubbing noted. Pedal hair growth normal.  Neurologic Normal speech. Oriented to person, place, and time. Epicritic sensation to light touch grossly present bilaterally.  Dermatologic Nails well groomed and normal in appearance. No open wounds. No skin lesions.  Orthopedic: Normal joint ROM without pain or crepitus bilaterally. No visible deformities. Pedal patient with right first amputated.  Pain on range of motion first temperature.  Local warmth and erythema.   Radiographs: Taken and reviewed.  No underlying osseous erosions.  Small bony prominence medial first MPJ.  Hallux joint maintained. Assessment:   1. Acute  gout involving toe of right foot, unspecified cause   2. Pain in toe of right foot   3. Capsulitis   4. 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  Gout first MPJ right -Educated on etiology -Educated on proper diet restrictions -Injection delivered to the right first MPJ as below -Rx colchicine  Procedure: Joint Injection Location: Right 1st MPJ joint Skin Prep: Alcohol. Injectate: 0.5 cc 1% lidocaine plain, 0.5 cc dexamethasone phosphate. Disposition: Patient tolerated procedure well.  Injection site dressed with a band-aid.    Return in about 3 weeks (around 11/17/2017) for Gout f/u Right.

## 2017-10-27 NOTE — Patient Instructions (Signed)

## 2017-10-29 ENCOUNTER — Other Ambulatory Visit: Payer: Self-pay | Admitting: Podiatry

## 2017-10-29 DIAGNOSIS — M779 Enthesopathy, unspecified: Secondary | ICD-10-CM

## 2017-10-29 DIAGNOSIS — M109 Gout, unspecified: Secondary | ICD-10-CM

## 2017-10-29 DIAGNOSIS — M79674 Pain in right toe(s): Secondary | ICD-10-CM

## 2017-11-12 ENCOUNTER — Encounter: Payer: Self-pay | Admitting: Cardiology

## 2017-11-12 ENCOUNTER — Ambulatory Visit (INDEPENDENT_AMBULATORY_CARE_PROVIDER_SITE_OTHER): Payer: Medicare Other | Admitting: Cardiology

## 2017-11-12 VITALS — BP 148/84 | HR 65 | Ht 72.0 in | Wt 236.2 lb

## 2017-11-12 DIAGNOSIS — R0789 Other chest pain: Secondary | ICD-10-CM | POA: Diagnosis not present

## 2017-11-12 DIAGNOSIS — I10 Essential (primary) hypertension: Secondary | ICD-10-CM | POA: Diagnosis not present

## 2017-11-12 DIAGNOSIS — R002 Palpitations: Secondary | ICD-10-CM

## 2017-11-12 DIAGNOSIS — I719 Aortic aneurysm of unspecified site, without rupture: Secondary | ICD-10-CM

## 2017-11-12 DIAGNOSIS — R9431 Abnormal electrocardiogram [ECG] [EKG]: Secondary | ICD-10-CM | POA: Diagnosis not present

## 2017-11-12 LAB — BASIC METABOLIC PANEL
BUN/Creatinine Ratio: 15 (ref 10–24)
BUN: 18 mg/dL (ref 8–27)
CO2: 23 mmol/L (ref 20–29)
CREATININE: 1.24 mg/dL (ref 0.76–1.27)
Calcium: 9.4 mg/dL (ref 8.6–10.2)
Chloride: 102 mmol/L (ref 96–106)
GFR calc Af Amer: 68 mL/min/{1.73_m2} (ref >59)
GFR, EST NON AFRICAN AMERICAN: 59 mL/min/{1.73_m2} — AB (ref >59)
GLUCOSE: 93 mg/dL (ref 65–99)
POTASSIUM: 5.2 mmol/L (ref 3.5–5.2)
SODIUM: 143 mmol/L (ref 134–144)

## 2017-11-12 NOTE — Addendum Note (Signed)
Addended by: Ashok Norris on: 11/12/2017 10:16 AM   Modules accepted: Orders

## 2017-11-12 NOTE — Patient Instructions (Signed)
Medication Instructions:  Your physician recommends that you continue on your current medications as directed. Please refer to the Current Medication list given to you today.  If you need a refill on your cardiac medications before your next appointment, please call your pharmacy.   Lab work: Your physician recommends that you return for lab work: Bmp   If you have labs (blood work) drawn today and your tests are completely normal, you will receive your results only by: Marland Kitchen MyChart Message (if you have MyChart) OR . A paper copy in the mail If you have any lab test that is abnormal or we need to change your treatment, we will call you to review the results.  Testing/Procedures: Non-Cardiac CT scanning, (CAT scanning), is a noninvasive, special x-ray that produces cross-sectional images of the body using x-rays and a computer. CT scans help physicians diagnose and treat medical conditions. For some CT exams, a contrast material is used to enhance visibility in the area of the body being studied. CT scans provide greater clarity and reveal more details than regular x-ray exams.     Follow-Up: At [facility], you and your health needs are our priority.  As part of our continuing mission to provide you with exceptional heart care, we have created designated Provider Care Teams.  These Care Teams include your primary Cardiologist (physician) and Advanced Practice Providers (APPs -  Physician Assistants and Nurse Practitioners) who all work together to provide you with the care you need, when you need it. You will need a follow up appointment in 4 months.  Please call our office 2 months in advance to schedule this appointment.  You may see Jenne Campus, MD or another member of our [place] Provider Team in [place]: Shirlee More, MD . Jyl Heinz, MD  Any Other Special Instructions Will Be Listed Below (If Applicable).

## 2017-11-12 NOTE — Progress Notes (Signed)
Cardiology Office Note:    Date:  11/12/2017   ID:  Clarence Hill, DOB 1945/05/07, MRN [id number]  PCP:  Clarence Sheriff, MD  Cardiologist:  Clarence Campus, MD    Referring MD: Clarence Sheriff, MD   Chief Complaint  Patient presents with  . Follow-up  I had a gout  History of Present Illness:    Clarence Hill is a 72 y.o. male who was referred to me because of palpitations his monitor showed only some extrasystole but no sustained arrhythmias.  His echocardiogram showed normal left ventricular ejection fraction stress test was normal interestingly he was find to have enlargement of the ascending aorta measuring 4.2 cm on echocardiogram.  He does have some problem with high blood pressure apparently his hydrochlorothiazide has been discontinued because of gout and then since that time he had difficulty getting his blood pressure under control so he is on amlodipine 5 mg daily also recently diastolic has been initiated but still blood pressure appears to be elevated.  I advised him today to double the dose of amlodipine to 10 mg daily I warned him about potential side effect which include swelling of lower extremities.  He is willing to try.  I also told him he need to wait few days before amlodipine and will start working.  We also talk about avoiding salty food and exercises on the regular basis with some measures that can bring your blood pressure down.  Past Medical History:  Diagnosis Date  . GERD (gastroesophageal reflux disease)   . Hypertension       Current Medications: Current Meds  Medication Sig  . allopurinol (ZYLOPRIM) 100 MG tablet Take 100 mg by mouth daily.  Marland Kitchen amLODipine (NORVASC) 5 MG tablet Take 5 mg by mouth 2 (two) times daily.   . colchicine 0.6 MG tablet Take 1 tablet (0.6 mg total) by mouth daily. As needed during gout flare  . hydrocortisone (ANUSOL-HC) 2.5 % rectal cream Proctosol HC 2.5 % topical cream perineal applicator  . losartan (COZAAR)  100 MG tablet TAKE 1 TABLET BY MOUTH DAILY IN THE MORNING  . Multiple Vitamins-Minerals (MULTIVITAMIN) LIQD Multi Vitamin  1 QD  . nebivolol (BYSTOLIC) 5 MG tablet Take 5 mg by mouth daily.  Marland Kitchen omeprazole (PRILOSEC) 40 MG capsule Take 1 capsule by mouth daily.  . traMADol (ULTRAM) 50 MG tablet Take 50 mg by mouth 2 (two) times daily as needed. for pain  . vitamin B-12 (CYANOCOBALAMIN) 1000 MCG tablet Take 1 tablet by mouth 2 (two) times daily.  Marland Kitchen zolpidem (AMBIEN) 10 MG tablet Take 1 tablet by mouth at bedtime as needed.     Allergies:   Levofloxacin; Penicillin g; and Penicillin g benzathine & proc   Social History   Socioeconomic History  . Marital status: Married    Spouse name: Not on file  . Number of children: Not on file  . Years of education: Not on file  . Highest education level: Not on file  Occupational History  . Not on file  Social Needs  . Financial resource strain: Not on file  . Food insecurity:    Worry: Not on file    Inability: Not on file  . Transportation needs:    Medical: Not on file    Non-medical: Not on file  Tobacco Use  . Smoking status: Former Research scientist (life sciences)  . Smokeless tobacco: Never Used  Substance and Sexual Activity  . Alcohol use: Yes  . Drug use: Not  on file  . Sexual activity: Not on file  Lifestyle  . Physical activity:    Days per week: Not on file    Minutes per session: Not on file  . Stress: Not on file  Relationships  . Social connections:    Talks on phone: Not on file    Gets together: Not on file    Attends religious service: Not on file    Active member of club or organization: Not on file    Attends meetings of clubs or organizations: Not on file    Relationship status: Not on file  Other Topics Concern  . Not on file  Social History Narrative  . Not on file     Family History: The patient's family history includes CAD in his father; Thyroid cancer in his mother. ROS:   Please see the history of present illness.      All 14 point review of systems negative except as described per history of present illness  EKGs/Labs/Other Studies Reviewed:      Recent Labs: No results found for requested labs within last 8760 hours.  Recent Lipid Panel No results found for: CHOL, TRIG, HDL, CHOLHDL, VLDL, LDLCALC, LDLDIRECT  Physical Exam:    VS:  BP (!) 148/84   Pulse 65   Ht 6' (1.829 m)   Wt 236 lb 3.2 oz (107.1 kg)   SpO2 98%   BMI 32.03 kg/m     Wt Readings from Last 3 Encounters:  11/12/17 236 lb 3.2 oz (107.1 kg)  06/17/17 231 lb 12.8 oz (105.1 kg)  05/12/17 233 lb 6.4 oz (105.9 kg)     GEN:  Well nourished, well developed in no acute distress HEENT: Normal NECK: No JVD; No carotid bruits LYMPHATICS: No lymphadenopathy CARDIAC: RRR, no murmurs, no rubs, no gallops RESPIRATORY:  Clear to auscultation without rales, wheezing or rhonchi  ABDOMEN: Soft, non-tender, non-distended MUSCULOSKELETAL:  No edema; No deformity  SKIN: Warm and dry LOWER EXTREMITIES: no swelling NEUROLOGIC:  Alert and oriented x 3 PSYCHIATRIC:  Normal affect   ASSESSMENT:    1. Palpitations   2. Atypical chest pain   3. Abnormal EKG    PLAN:    In order of problems listed above:  1. Palpitations but denies having any trouble with it so far all work-up negative. 2. 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  Will check Chem-7 if Chem-7 is fine then we will do CT Angie of his chest to look at the aorta. 3. Atypical chest pain denies having any. 4. Essential hypertension difficult to control will increase dose of amlodipine.   Medication Adjustments/Labs and Tests Ordered: Current medicines are reviewed at length with the patient today.  Concerns regarding medicines are outlined above.  No orders of the defined types were placed in this encounter.  Medication changes: No orders of the defined types were placed in this encounter.   Signed, Park Liter, MD, [facility] 11/12/2017 9:45 AM    [place]

## 2017-11-17 ENCOUNTER — Ambulatory Visit (INDEPENDENT_AMBULATORY_CARE_PROVIDER_SITE_OTHER): Payer: Medicare Other | Admitting: Podiatry

## 2017-11-17 DIAGNOSIS — M109 Gout, unspecified: Secondary | ICD-10-CM | POA: Diagnosis not present

## 2017-11-17 DIAGNOSIS — M79674 Pain in right toe(s): Secondary | ICD-10-CM

## 2017-11-17 NOTE — Progress Notes (Signed)
Subjective:  Patient ID: Clarence Hill, male    DOB: 11/27/1945,  MRN: [id number]  Chief Complaint  Patient presents with  . Gout    F/U R  1st MPJ Pt. stated," it started feeling better so I stopped taking the Rx. It's 80% better." Tx: colchicine (pt only took 10 pills) and ROM    72 y.o. male presents with right great toe pain. States the area is 80% better but still with some pain.  Review of Systems: Negative except as noted in the HPI. Denies N/V/F/Ch.  Past Medical History:  Diagnosis Date  . GERD (gastroesophageal reflux disease)   . Hypertension     Current Outpatient Medications:  .  allopurinol (ZYLOPRIM) 100 MG tablet, Take 100 mg by mouth daily., Disp: , Rfl: 1 .  amLODipine (NORVASC) 5 MG tablet, Take 5 mg by mouth 2 (two) times daily. , Disp: , Rfl: 2 .  colchicine 0.6 MG tablet, Take 1 tablet (0.6 mg total) by mouth daily. As needed during gout flare, Disp: 30 tablet, Rfl: 0 .  hydrocortisone (ANUSOL-HC) 2.5 % rectal cream, Proctosol HC 2.5 % topical cream perineal applicator, Disp: , Rfl:  .  losartan (COZAAR) 100 MG tablet, TAKE 1 TABLET BY MOUTH DAILY IN THE MORNING, Disp: , Rfl: 2 .  Multiple Vitamins-Minerals (MULTIVITAMIN) LIQD, Multi Vitamin  1 QD, Disp: , Rfl:  .  nebivolol (BYSTOLIC) 5 MG tablet, Take 5 mg by mouth daily., Disp: , Rfl:  .  omeprazole (PRILOSEC) 40 MG capsule, Take 1 capsule by mouth daily., Disp: , Rfl:  .  traMADol (ULTRAM) 50 MG tablet, Take 50 mg by mouth 2 (two) times daily as needed. for pain, Disp: , Rfl: 0 .  vitamin B-12 (CYANOCOBALAMIN) 1000 MCG tablet, Take 1 tablet by mouth 2 (two) times daily., Disp: , Rfl:  .  zolpidem (AMBIEN) 10 MG tablet, Take 1 tablet by mouth at bedtime as needed., Disp: , Rfl:   Social History   Tobacco Use  Smoking Status Former Smoker  Smokeless Tobacco Never Used    Allergies  Allergen Reactions  . Levofloxacin Other (See Comments)  . Penicillin G Other (See Comments) and Rash     Unknown Unknown  . Penicillin G Benzathine & Proc Rash   Objective:  There were no vitals filed for this visit. There is no height or weight on file to calculate BMI. Constitutional Well developed. Well nourished.  Vascular Dorsalis pedis pulses palpable bilaterally. Posterior tibial pulses palpable bilaterally. Capillary refill normal to all digits.  No cyanosis or clubbing noted. Pedal hair growth normal.  Neurologic Normal speech. Oriented to person, place, and time. Epicritic sensation to light touch grossly present bilaterally.  Dermatologic Nails well groomed and normal in appearance. No open wounds. No skin lesions.  Orthopedic: Normal joint ROM without pain or crepitus bilaterally. No visible deformities. POP R 1st MPJ. No local warmth.   Radiographs:  9/30: No underlying osseous erosions.  Small bony prominence medial first MPJ.  Hallux joint maintained. Assessment:   1. Acute gout involving toe of right foot, unspecified cause   2. Pain in toe of right foot    Plan:  Patient was evaluated and treated and all questions answered.  Gout first MPJ right -Improving with some residual pain. -Repeat injection as below. -Discussed again diet restrictions.  Procedure: Joint Injection Location: Right 1st MPJ joint Skin Prep: Alcohol. Injectate: 0.5 cc 1% lidocaine plain, 0.5 cc dexamethasone phosphate. Disposition: Patient tolerated procedure well. Injection site  dressed with a band-aid.      Return if symptoms worsen or fail to improve.

## 2017-11-19 ENCOUNTER — Other Ambulatory Visit: Payer: Self-pay | Admitting: Podiatry

## 2017-11-21 ENCOUNTER — Telehealth: Payer: Self-pay | Admitting: Cardiology

## 2017-11-21 ENCOUNTER — Other Ambulatory Visit: Payer: Self-pay | Admitting: Emergency Medicine

## 2017-11-21 MED ORDER — NEBIVOLOL HCL 5 MG PO TABS: 5.0000 mg | ORAL_TABLET | Freq: Every day | ORAL | 1 refills | Status: DC

## 2017-11-21 NOTE — Telephone Encounter (Signed)
Nebivolol 5 mg daily refilled.

## 2017-11-21 NOTE — Telephone Encounter (Signed)
Patient called and wants to get a pres for generic Bystolic and please call to the [facility] [address].

## 2017-11-21 NOTE — Telephone Encounter (Signed)
Medication refilled

## 2017-11-24 ENCOUNTER — Other Ambulatory Visit: Payer: Self-pay | Admitting: Emergency Medicine

## 2017-11-24 MED ORDER — NEBIVOLOL HCL 5 MG PO TABS: 5.0000 mg | ORAL_TABLET | Freq: Every day | ORAL | 2 refills | Status: DC

## 2017-11-24 NOTE — Telephone Encounter (Signed)
Spoke with patient about this. Refilled the generic for Bystolic.

## 2017-11-24 NOTE — Telephone Encounter (Signed)
Pt stopped by with questions about this

## 2017-11-26 ENCOUNTER — Ambulatory Visit (INDEPENDENT_AMBULATORY_CARE_PROVIDER_SITE_OTHER)
Admission: RE | Admit: 2017-11-26 | Discharge: 2017-11-26 | Disposition: A | Discharge status: Home / Self Care | Payer: Medicare Other | Source: Ambulatory Visit | Attending: Cardiology | Admitting: Cardiology

## 2017-11-26 DIAGNOSIS — Z0389 Encounter for observation for other suspected diseases and conditions ruled out: Secondary | ICD-10-CM | POA: Diagnosis not present

## 2017-11-26 DIAGNOSIS — I719 Aortic aneurysm of unspecified site, without rupture: Secondary | ICD-10-CM | POA: Diagnosis not present

## 2017-11-26 MED ORDER — IOPAMIDOL (ISOVUE-370) INJECTION 76%
100.0000 mL | Freq: Once | INTRAVENOUS | Status: AC | PRN
  Administered 2017-11-26: 100 mL via INTRAVENOUS

## 2017-12-16 ENCOUNTER — Other Ambulatory Visit: Payer: Self-pay | Admitting: Podiatry

## 2017-12-16 DIAGNOSIS — K645 Perianal venous thrombosis: Secondary | ICD-10-CM | POA: Diagnosis not present

## 2017-12-16 DIAGNOSIS — Z6832 Body mass index (BMI) 32.0-32.9, adult: Secondary | ICD-10-CM | POA: Diagnosis not present

## 2017-12-18 DIAGNOSIS — K645 Perianal venous thrombosis: Secondary | ICD-10-CM | POA: Insufficient documentation

## 2017-12-18 HISTORY — DX: Perianal venous thrombosis: K64.5

## 2017-12-23 ENCOUNTER — Ambulatory Visit (INDEPENDENT_AMBULATORY_CARE_PROVIDER_SITE_OTHER): Payer: Medicare Other | Admitting: Podiatry

## 2017-12-23 DIAGNOSIS — M7751 Other enthesopathy of right foot: Secondary | ICD-10-CM

## 2017-12-23 NOTE — Progress Notes (Signed)
Subjective:  Patient ID: Clarence Hill, male    DOB: 1945/11/24,  MRN: [id number]  Chief Complaint  Patient presents with  . Gout    F/U R hallux gout Pt. states," the toe is kind of sore when I flex it or bend it, it's not as bas as it used to be but I'm concen since I'm going out of town." tx: colchicine    72 y.o. male presents with right great toe pain. History as above still having pain when he bends the toe up. Not as bad as previously however.  Review of Systems: Negative except as noted in the HPI. Denies N/V/F/Ch.  Past Medical History:  Diagnosis Date  . GERD (gastroesophageal reflux disease)   . Hypertension     Current Outpatient Medications:  .  allopurinol (ZYLOPRIM) 100 MG tablet, Take 100 mg by mouth daily., Disp: , Rfl: 1 .  amLODipine (NORVASC) 5 MG tablet, Take 5 mg by mouth 2 (two) times daily. , Disp: , Rfl: 2 .  colchicine 0.6 MG tablet, TAKE 1 TABLET (0.6 MG TOTAL) BY MOUTH DAILY. AS NEEDED DURING GOUT FLARE, Disp: 30 tablet, Rfl: 0 .  hydrocortisone (ANUSOL-HC) 2.5 % rectal cream, Proctosol HC 2.5 % topical cream perineal applicator, Disp: , Rfl:  .  losartan (COZAAR) 100 MG tablet, TAKE 1 TABLET BY MOUTH DAILY IN THE MORNING, Disp: , Rfl: 2 .  Multiple Vitamins-Minerals (MULTIVITAMIN) LIQD, Multi Vitamin  1 QD, Disp: , Rfl:  .  nebivolol (BYSTOLIC) 5 MG tablet, Take 1 tablet (5 mg total) by mouth daily., Disp: 30 tablet, Rfl: 2 .  omeprazole (PRILOSEC) 40 MG capsule, Take 1 capsule by mouth daily., Disp: , Rfl:  .  traMADol (ULTRAM) 50 MG tablet, Take 50 mg by mouth 2 (two) times daily as needed. for pain, Disp: , Rfl: 0 .  vitamin B-12 (CYANOCOBALAMIN) 1000 MCG tablet, Take 1 tablet by mouth 2 (two) times daily., Disp: , Rfl:  .  zolpidem (AMBIEN) 10 MG tablet, Take 1 tablet by mouth at bedtime as needed., Disp: , Rfl:   Social History   Tobacco Use  Smoking Status Former Smoker  Smokeless Tobacco Never Used    Allergies  Allergen Reactions  .  Levofloxacin Other (See Comments)  . Penicillin G Other (See Comments) and Rash    Unknown Unknown  . Penicillin G Benzathine & Proc Rash   Objective:  There were no vitals filed for this visit. There is no height or weight on file to calculate BMI. Constitutional Well developed. Well nourished.  Vascular Dorsalis pedis pulses palpable bilaterally. Posterior tibial pulses palpable bilaterally. Capillary refill normal to all digits.  No cyanosis or clubbing noted. Pedal hair growth normal.  Neurologic Normal speech. Oriented to person, place, and time. Epicritic sensation to light touch grossly present bilaterally.  Dermatologic Nails well groomed and normal in appearance. No open wounds. No skin lesions.  Orthopedic: Normal joint ROM without pain or crepitus bilaterally. No visible deformities. POP R 1st MPJ. No local warmth.   Radiographs:  9/30: No underlying osseous erosions.  Small bony prominence medial first MPJ.  Hallux joint maintained. Assessment:   No diagnosis found. Plan:  Patient was evaluated and treated and all questions answered.  Gout first MPJ right -Improving with some residual pain. -Final injection for now -Discussed should pain worsen possible steroid pack.  Procedure: Joint Injection Location: Left 1st MP joint Skin Prep: Alcohol. Injectate: 0.5 cc 1% lidocaine plain, 0.5 cc dexamethasone phosphate. Disposition: Patient  tolerated procedure well. Injection site dressed with a band-aid.    Return in about 3 weeks (around 01/13/2018) for gout f/u.

## 2018-01-14 DIAGNOSIS — R2232 Localized swelling, mass and lump, left upper limb: Secondary | ICD-10-CM

## 2018-01-14 HISTORY — DX: Localized swelling, mass and lump, left upper limb: R22.32

## 2018-03-12 DIAGNOSIS — H0102A Squamous blepharitis right eye, upper and lower eyelids: Secondary | ICD-10-CM | POA: Diagnosis not present

## 2018-03-12 DIAGNOSIS — Z961 Presence of intraocular lens: Secondary | ICD-10-CM | POA: Diagnosis not present

## 2018-03-12 DIAGNOSIS — H25812 Combined forms of age-related cataract, left eye: Secondary | ICD-10-CM | POA: Diagnosis not present

## 2018-03-12 DIAGNOSIS — Z9841 Cataract extraction status, right eye: Secondary | ICD-10-CM | POA: Diagnosis not present

## 2018-03-12 DIAGNOSIS — H348312 Tributary (branch) retinal vein occlusion, right eye, stable: Secondary | ICD-10-CM | POA: Diagnosis not present

## 2018-03-12 DIAGNOSIS — H0102B Squamous blepharitis left eye, upper and lower eyelids: Secondary | ICD-10-CM | POA: Diagnosis not present

## 2018-03-13 DIAGNOSIS — M79671 Pain in right foot: Secondary | ICD-10-CM | POA: Diagnosis not present

## 2018-03-13 DIAGNOSIS — M25571 Pain in right ankle and joints of right foot: Secondary | ICD-10-CM | POA: Diagnosis not present

## 2018-03-13 DIAGNOSIS — M79604 Pain in right leg: Secondary | ICD-10-CM | POA: Diagnosis not present

## 2018-03-13 DIAGNOSIS — Z6832 Body mass index (BMI) 32.0-32.9, adult: Secondary | ICD-10-CM | POA: Diagnosis not present

## 2018-03-16 ENCOUNTER — Ambulatory Visit: Payer: Medicare Other | Admitting: Podiatry

## 2018-03-16 DIAGNOSIS — M109 Gout, unspecified: Secondary | ICD-10-CM | POA: Diagnosis not present

## 2018-03-19 DIAGNOSIS — S93421A Sprain of deltoid ligament of right ankle, initial encounter: Secondary | ICD-10-CM | POA: Diagnosis not present

## 2018-03-31 DIAGNOSIS — R600 Localized edema: Secondary | ICD-10-CM | POA: Diagnosis not present

## 2018-03-31 DIAGNOSIS — I8310 Varicose veins of unspecified lower extremity with inflammation: Secondary | ICD-10-CM | POA: Diagnosis not present

## 2018-05-27 ENCOUNTER — Other Ambulatory Visit: Payer: Self-pay | Admitting: Podiatry

## 2018-06-02 NOTE — Telephone Encounter (Signed)
Message was left for patient that he needed to schedule an appointment last week 2nd denial sent to pharmacy

## 2018-06-02 NOTE — Telephone Encounter (Signed)
Pt is requesting a a refill of [facility]icine be [id number]. States he called last week and it was not sent to pharmacy.

## 2018-06-08 ENCOUNTER — Other Ambulatory Visit: Payer: Self-pay

## 2018-06-08 ENCOUNTER — Ambulatory Visit (INDEPENDENT_AMBULATORY_CARE_PROVIDER_SITE_OTHER): Payer: Medicare Other | Admitting: Podiatry

## 2018-06-08 ENCOUNTER — Encounter: Payer: Self-pay | Admitting: Podiatry

## 2018-06-08 VITALS — Temp 98.2°F | Resp 16

## 2018-06-08 DIAGNOSIS — M7751 Other enthesopathy of right foot: Secondary | ICD-10-CM

## 2018-06-08 DIAGNOSIS — M109 Gout, unspecified: Secondary | ICD-10-CM

## 2018-06-08 DIAGNOSIS — M79674 Pain in right toe(s): Secondary | ICD-10-CM

## 2018-06-08 MED ORDER — COLCHICINE 0.6 MG PO TABS: 0.6000 mg | ORAL_TABLET | Freq: Every day | ORAL | 0 refills | Status: DC

## 2018-06-08 NOTE — Progress Notes (Signed)
Subjective:  Patient ID: Clarence Hill, male    DOB: 1945/05/02,  MRN: [id number]  Chief Complaint  Patient presents with  . Gout    F/U Rt foot gout Pt. states," doing well. But every now and then I get a flare up but it's doing much better." -pt requesting colchicine RF    73 y.o. male presents with right great toe pain. States he has episodic flare ups, sometimes now on the left side. Requests refill of Colchicine. States the medicine helps during flare ups.  Review of Systems: Negative except as noted in the HPI. Denies N/V/F/Ch.  Past Medical History:  Diagnosis Date  . GERD (gastroesophageal reflux disease)   . Hypertension     Current Outpatient Medications:  .  allopurinol (ZYLOPRIM) 100 MG tablet, Take 100 mg by mouth daily., Disp: , Rfl: 1 .  amLODipine (NORVASC) 10 MG tablet, Take 10 mg by mouth daily., Disp: , Rfl:  .  amLODipine (NORVASC) 5 MG tablet, Take 5 mg by mouth 2 (two) times daily. , Disp: , Rfl: 2 .  colchicine 0.6 MG tablet, Take 1 tablet (0.6 mg total) by mouth daily. As needed during gout flare, Disp: 30 tablet, Rfl: 0 .  hydrocortisone (ANUSOL-HC) 2.5 % rectal cream, Proctosol HC 2.5 % topical cream perineal applicator, Disp: , Rfl:  .  losartan (COZAAR) 100 MG tablet, TAKE 1 TABLET BY MOUTH DAILY IN THE MORNING, Disp: , Rfl: 2 .  Multiple Vitamins-Minerals (MULTIVITAMIN) LIQD, Multi Vitamin  1 QD, Disp: , Rfl:  .  nebivolol (BYSTOLIC) 5 MG tablet, Take 1 tablet (5 mg total) by mouth daily., Disp: 30 tablet, Rfl: 2 .  omeprazole (PRILOSEC) 40 MG capsule, Take 1 capsule by mouth daily., Disp: , Rfl:  .  probenecid (BENEMID) 500 MG tablet, , Disp: , Rfl:  .  traMADol (ULTRAM) 50 MG tablet, Take 50 mg by mouth 2 (two) times daily as needed. for pain, Disp: , Rfl: 0 .  vitamin B-12 (CYANOCOBALAMIN) 1000 MCG tablet, Take 1 tablet by mouth 2 (two) times daily., Disp: , Rfl:  .  zolpidem (AMBIEN) 10 MG tablet, Take 1 tablet by mouth at bedtime as needed., Disp:  , Rfl:   Social History   Tobacco Use  Smoking Status Former Smoker  Smokeless Tobacco Never Used    Allergies  Allergen Reactions  . Levofloxacin Other (See Comments)  . Penicillin G Other (See Comments) and Rash    Unknown Unknown  . Penicillin G Benzathine & Proc Rash   Objective:   Vitals:   06/08/18 1518  Resp: 16  Temp: 98.2 F (36.8 C)   There is no height or weight on file to calculate BMI. Constitutional Well developed. Well nourished.  Vascular Dorsalis pedis pulses palpable bilaterally. Posterior tibial pulses palpable bilaterally. Capillary refill normal to all digits.  No cyanosis or clubbing noted. Pedal hair growth normal.  Neurologic Normal speech. Oriented to person, place, and time. Epicritic sensation to light touch grossly present bilaterally.  Dermatologic Nails well groomed and normal in appearance. No open wounds. No skin lesions.  Orthopedic: Normal joint ROM without pain or crepitus bilaterally. No visible deformities. Slight POP R 1st MPJ. No local warmth or erythema. No pain to palpation left 1st MPJ   Radiographs:  9/30: No underlying osseous erosions.  Small bony prominence medial first MPJ.  Hallux joint maintained. Assessment:   1. Capsulitis of metatarsophalangeal (MTP) joint of right foot   2. Acute gout involving toe of right  foot, unspecified cause   3. Pain in toe of right foot    Plan:  Patient was evaluated and treated and all questions answered.  Gout first MPJ right -Still with some pain -Refill Colchicine -Educated on use of colchicine and allopurinol -F/u as needed for flare for possible injection.  Return if symptoms worsen or fail to improve.

## 2018-06-15 DIAGNOSIS — H348312 Tributary (branch) retinal vein occlusion, right eye, stable: Secondary | ICD-10-CM | POA: Diagnosis not present

## 2018-06-15 DIAGNOSIS — H0102B Squamous blepharitis left eye, upper and lower eyelids: Secondary | ICD-10-CM | POA: Diagnosis not present

## 2018-06-15 DIAGNOSIS — H25812 Combined forms of age-related cataract, left eye: Secondary | ICD-10-CM | POA: Diagnosis not present

## 2018-06-15 DIAGNOSIS — Z961 Presence of intraocular lens: Secondary | ICD-10-CM | POA: Diagnosis not present

## 2018-06-15 DIAGNOSIS — H43813 Vitreous degeneration, bilateral: Secondary | ICD-10-CM | POA: Diagnosis not present

## 2018-06-15 DIAGNOSIS — H0102A Squamous blepharitis right eye, upper and lower eyelids: Secondary | ICD-10-CM | POA: Diagnosis not present

## 2018-07-01 ENCOUNTER — Other Ambulatory Visit: Payer: Self-pay | Admitting: Podiatry

## 2018-07-01 NOTE — Telephone Encounter (Signed)
Dr March Rummage please advise

## 2018-07-29 ENCOUNTER — Other Ambulatory Visit: Payer: Self-pay | Admitting: Podiatry

## 2018-07-29 NOTE — Telephone Encounter (Signed)
Dr. March Rummage please advice on refill. Thank you

## 2018-08-28 ENCOUNTER — Other Ambulatory Visit: Payer: Self-pay

## 2018-11-02 DIAGNOSIS — R899 Unspecified abnormal finding in specimens from other organs, systems and tissues: Secondary | ICD-10-CM | POA: Diagnosis not present

## 2018-11-02 DIAGNOSIS — Z79899 Other long term (current) drug therapy: Secondary | ICD-10-CM | POA: Diagnosis not present

## 2018-11-02 DIAGNOSIS — Z0000 Encounter for general adult medical examination without abnormal findings: Secondary | ICD-10-CM | POA: Diagnosis not present

## 2018-11-02 DIAGNOSIS — Z6832 Body mass index (BMI) 32.0-32.9, adult: Secondary | ICD-10-CM | POA: Diagnosis not present

## 2018-11-02 DIAGNOSIS — Z23 Encounter for immunization: Secondary | ICD-10-CM | POA: Diagnosis not present

## 2018-11-02 DIAGNOSIS — I10 Essential (primary) hypertension: Secondary | ICD-10-CM | POA: Diagnosis not present

## 2018-11-02 DIAGNOSIS — Z125 Encounter for screening for malignant neoplasm of prostate: Secondary | ICD-10-CM | POA: Diagnosis not present

## 2018-11-02 DIAGNOSIS — R350 Frequency of micturition: Secondary | ICD-10-CM | POA: Diagnosis not present

## 2018-11-02 DIAGNOSIS — M109 Gout, unspecified: Secondary | ICD-10-CM | POA: Diagnosis not present

## 2018-12-12 DIAGNOSIS — Z20828 Contact with and (suspected) exposure to other viral communicable diseases: Secondary | ICD-10-CM | POA: Diagnosis not present

## 2018-12-12 DIAGNOSIS — R509 Fever, unspecified: Secondary | ICD-10-CM | POA: Diagnosis not present

## 2018-12-12 DIAGNOSIS — J029 Acute pharyngitis, unspecified: Secondary | ICD-10-CM | POA: Diagnosis not present

## 2018-12-23 ENCOUNTER — Other Ambulatory Visit: Payer: Self-pay

## 2019-01-04 DIAGNOSIS — Z961 Presence of intraocular lens: Secondary | ICD-10-CM | POA: Diagnosis not present

## 2019-01-04 DIAGNOSIS — H439 Unspecified disorder of vitreous body: Secondary | ICD-10-CM | POA: Diagnosis not present

## 2019-01-04 DIAGNOSIS — Z9841 Cataract extraction status, right eye: Secondary | ICD-10-CM | POA: Diagnosis not present

## 2019-01-04 DIAGNOSIS — H35363 Drusen (degenerative) of macula, bilateral: Secondary | ICD-10-CM | POA: Diagnosis not present

## 2019-01-04 DIAGNOSIS — H25812 Combined forms of age-related cataract, left eye: Secondary | ICD-10-CM | POA: Diagnosis not present

## 2019-01-05 ENCOUNTER — Encounter: Payer: Self-pay | Admitting: Cardiology

## 2019-01-05 ENCOUNTER — Other Ambulatory Visit: Payer: Self-pay

## 2019-01-05 ENCOUNTER — Ambulatory Visit (INDEPENDENT_AMBULATORY_CARE_PROVIDER_SITE_OTHER): Payer: Medicare Other | Admitting: Cardiology

## 2019-01-05 VITALS — BP 130/80 | HR 64 | Resp 16 | Ht 72.0 in | Wt 241.0 lb

## 2019-01-05 DIAGNOSIS — R002 Palpitations: Secondary | ICD-10-CM

## 2019-01-05 DIAGNOSIS — I10 Essential (primary) hypertension: Secondary | ICD-10-CM

## 2019-01-05 DIAGNOSIS — R0789 Other chest pain: Secondary | ICD-10-CM

## 2019-01-05 NOTE — Addendum Note (Signed)
Addended by: Ashok Norris on: 01/05/2019 04:03 PM   Modules accepted: Orders

## 2019-01-05 NOTE — Patient Instructions (Signed)
Medication Instructions:  Your physician recommends that you continue on your current medications as directed. Please refer to the Current Medication list given to you today.  *If you need a refill on your cardiac medications before your next appointment, please call your pharmacy*  Lab Work: None.  If you have labs (blood work) drawn today and your tests are completely normal, you will receive your results only by: Marland Kitchen MyChart Message (if you have MyChart) OR . A paper copy in the mail If you have any lab test that is abnormal or we need to change your treatment, we will call you to review the results.  Testing/Procedures: Your physician has requested that you have an echocardiogram. Echocardiography is a painless test that uses sound waves to create images of your heart. It provides your doctor with information about the size and shape of your heart and how well your heart's chambers and valves are working. This procedure takes approximately one hour. There are no restrictions for this procedure.    Follow-Up: At [facility], you and your health needs are our priority.  As part of our continuing mission to provide you with exceptional heart care, we have created designated Provider Care Teams.  These Care Teams include your primary Cardiologist (physician) and Advanced Practice Providers (APPs -  Physician Assistants and Nurse Practitioners) who all work together to provide you with the care you need, when you need it.  Your next appointment:   1 year(s)  The format for your next appointment:   In Person  Provider:   Jenne Campus, MD  Other Instructions   Echocardiogram An echocardiogram is a procedure that uses painless sound waves (ultrasound) to produce an image of the heart. Images from an echocardiogram can provide important information about:  Signs of coronary artery disease (CAD).  Aneurysm detection. An aneurysm is a weak or damaged part of an artery wall that  bulges out from the normal force of blood pumping through the body.  Heart size and shape. Changes in the size or shape of the heart can be associated with certain conditions, including heart failure, aneurysm, and CAD.  Heart muscle function.  Heart valve function.  Signs of a past heart attack.  Fluid buildup around the heart.  Thickening of the heart muscle.  A tumor or infectious growth around the heart valves. Tell a health care provider about:  Any allergies you have.  All medicines you are taking, including vitamins, herbs, eye drops, creams, and over-the-counter medicines.  Any blood disorders you have.  Any surgeries you have had.  Any medical conditions you have.  Whether you are pregnant or may be pregnant. What are the risks? Generally, this is a safe procedure. However, problems may occur, including:  Allergic reaction to dye (contrast) that may be used during the procedure. What happens before the procedure? No specific preparation is needed. You may eat and drink normally. What happens during the procedure?   An IV tube may be inserted into one of your veins.  You may receive contrast through this tube. A contrast is an injection that improves the quality of the pictures from your heart.  A gel will be applied to your chest.  A wand-like tool (transducer) will be moved over your chest. The gel will help to transmit the sound waves from the transducer.  The sound waves will harmlessly bounce off of your heart to allow the heart images to be captured in real-time motion. The images will be recorded  on a computer. The procedure may vary among health care providers and hospitals. What happens after the procedure?  You may return to your normal, everyday life, including diet, activities, and medicines, unless your health care provider tells you not to do that. Summary  An echocardiogram is a procedure that uses painless sound waves (ultrasound) to produce  an image of the heart.  Images from an echocardiogram can provide important information about the size and shape of your heart, heart muscle function, heart valve function, and fluid buildup around your heart.  You do not need to do anything to prepare before this procedure. You may eat and drink normally.  After the echocardiogram is completed, you may return to your normal, everyday life, unless your health care provider tells you not to do that. This information is not intended to replace advice given to you by your health care provider. Make sure you discuss any questions you have with your health care provider. Document Released: 01/12/2000 Document Revised: 05/07/2018 Document Reviewed: 02/17/2016 Elsevier Patient Education  2020 [employer]   

## 2019-01-05 NOTE — Progress Notes (Signed)
Cardiology Office Note:    Date:  01/05/2019   ID:  Clarence Hill, DOB 01-17-1946, MRN [id number]  PCP:  Clarence Sheriff, MD  Cardiologist:  Clarence Campus, MD    Referring MD: Clarence Sheriff, MD   Chief Complaint  Patient presents with  . Follow-up  Doing well  History of Present Illness:    Clarence Hill is a 73 y.o. male with palpitations in form of PVCs, no dizziness no syncope, atypical chest pain that he denies having any, also was found to have by echocardiogram enlarged arctic root however CT angio after that done showed no significant enlargement of the aorta.  Overall doing well.  Denies have any chest pain tightness squeezing pressure burning chest  Past Medical History:  Diagnosis Date  . GERD (gastroesophageal reflux disease)   . Hypertension     Past Surgical History:  Procedure Laterality Date  . APPENDECTOMY    . CARDIAC CATHETERIZATION    . CATARACT EXTRACTION      Current Medications: Current Meds  Medication Sig  . allopurinol (ZYLOPRIM) 100 MG tablet Take 100 mg by mouth daily.  Marland Kitchen amLODipine (NORVASC) 10 MG tablet Take 10 mg by mouth daily.  Marland Kitchen aspirin EC 81 MG tablet Take 81 mg by mouth daily.  . colchicine 0.6 MG tablet TAKE 1 TABLET (0.6 MG TOTAL) BY MOUTH DAILY. AS NEEDED DURING GOUT FLARE  . hydrocortisone (ANUSOL-HC) 2.5 % rectal cream Proctosol HC 2.5 % topical cream perineal applicator  . irbesartan (AVAPRO) 300 MG tablet Take 150 mg by mouth daily.  . Multiple Vitamins-Minerals (MULTIVITAMIN) LIQD Multi Vitamin  1 QD  . nebivolol (BYSTOLIC) 5 MG tablet Take 1 tablet (5 mg total) by mouth daily.  Marland Kitchen omeprazole (PRILOSEC) 40 MG capsule Take 1 capsule by mouth daily.  . vitamin B-12 (CYANOCOBALAMIN) 1000 MCG tablet Take 2,500 mcg by mouth daily.  Marland Kitchen zolpidem (AMBIEN) 10 MG tablet Take 1 tablet by mouth at bedtime as needed.     Allergies:   Levofloxacin, Penicillin g, and Penicillin g benzathine & proc   Social History    Socioeconomic History  . Marital status: Married    Spouse name: Not on file  . Number of children: Not on file  . Years of education: Not on file  . Highest education level: Not on file  Occupational History  . Not on file  Social Needs  . Financial resource strain: Not on file  . Food insecurity    Worry: Not on file    Inability: Not on file  . Transportation needs    Medical: Not on file    Non-medical: Not on file  Tobacco Use  . Smoking status: Former Research scientist (life sciences)  . Smokeless tobacco: Never Used  Substance and Sexual Activity  . Alcohol use: Yes    Alcohol/week: 5.0 standard drinks    Types: 5 Glasses of wine per week  . Drug use: Never  . Sexual activity: Not on file  Lifestyle  . Physical activity    Days per week: Not on file    Minutes per session: Not on file  . Stress: Not on file  Relationships  . Social Herbalist on phone: Not on file    Gets together: Not on file    Attends religious service: Not on file    Active member of club or organization: Not on file    Attends meetings of clubs or organizations: Not on file  Relationship status: Not on file  Other Topics Concern  . Not on file  Social History Narrative  . Not on file     Family History: The patient's family history includes CAD in his father and paternal uncle; Thyroid cancer in his mother. ROS:   Please see the history of present illness.    All 14 point review of systems negative except as described per history of present illness  EKGs/Labs/Other Studies Reviewed:      Recent Labs: No results found for requested labs within last 8760 hours.  Recent Lipid Panel No results found for: CHOL, TRIG, HDL, CHOLHDL, VLDL, LDLCALC, LDLDIRECT  Physical Exam:    VS:  BP 130/80 (BP Location: Right Arm, Patient Position: Sitting)   Pulse 64   Resp 16   Ht 6' (1.829 m)   Wt 241 lb (109.3 kg)   SpO2 97%   BMI 32.69 kg/m     Wt Readings from Last 3 Encounters:  01/05/19 241 lb  (109.3 kg)  11/12/17 236 lb 3.2 oz (107.1 kg)  06/17/17 231 lb 12.8 oz (105.1 kg)     GEN:  Well nourished, well developed in no acute distress HEENT: Normal NECK: No JVD; No carotid bruits LYMPHATICS: No lymphadenopathy CARDIAC: RRR, no murmurs, no rubs, no gallops RESPIRATORY:  Clear to auscultation without rales, wheezing or rhonchi  ABDOMEN: Soft, non-tender, non-distended MUSCULOSKELETAL:  No edema; No deformity  SKIN: Warm and dry LOWER EXTREMITIES: no swelling NEUROLOGIC:  Alert and oriented x 3 PSYCHIATRIC:  Normal affect   ASSESSMENT:    1. Benign essential hypertension   2. Palpitations   3. Atypical chest pain    PLAN:    In order of problems listed above:  1. Benign essential hypertension blood pressure well controlled continue present management. 2. Palpitations very few and higher in between.  He is happy with the way he feels I will schedule him to have echocardiogram to assess left ventricle ejection fraction we will continue present medications. 3. Atypical chest pain denies having any. 4. Cholesterol status is very good.  His LDL 46 HDL 38 without medications   Medication Adjustments/Labs and Tests Ordered: Current medicines are reviewed at length with the patient today.  Concerns regarding medicines are outlined above.  No orders of the defined types were placed in this encounter.  Medication changes: No orders of the defined types were placed in this encounter.   Signed, Clarence Liter, MD, [facility] 01/05/2019 3:59 PM    [place]

## 2019-01-08 DIAGNOSIS — H25812 Combined forms of age-related cataract, left eye: Secondary | ICD-10-CM | POA: Diagnosis not present

## 2019-01-08 DIAGNOSIS — Z01818 Encounter for other preprocedural examination: Secondary | ICD-10-CM | POA: Diagnosis not present

## 2019-01-11 DIAGNOSIS — Z6832 Body mass index (BMI) 32.0-32.9, adult: Secondary | ICD-10-CM | POA: Diagnosis not present

## 2019-01-11 DIAGNOSIS — Z23 Encounter for immunization: Secondary | ICD-10-CM | POA: Diagnosis not present

## 2019-01-11 DIAGNOSIS — I10 Essential (primary) hypertension: Secondary | ICD-10-CM | POA: Diagnosis not present

## 2019-01-11 DIAGNOSIS — Z9181 History of falling: Secondary | ICD-10-CM | POA: Diagnosis not present

## 2019-01-11 DIAGNOSIS — Z1331 Encounter for screening for depression: Secondary | ICD-10-CM | POA: Diagnosis not present

## 2019-02-03 DIAGNOSIS — Z48810 Encounter for surgical aftercare following surgery on the sense organs: Secondary | ICD-10-CM | POA: Diagnosis not present

## 2019-02-03 DIAGNOSIS — Z961 Presence of intraocular lens: Secondary | ICD-10-CM | POA: Diagnosis not present

## 2019-02-03 DIAGNOSIS — H43811 Vitreous degeneration, right eye: Secondary | ICD-10-CM | POA: Diagnosis not present

## 2019-02-09 DIAGNOSIS — H209 Unspecified iridocyclitis: Secondary | ICD-10-CM | POA: Insufficient documentation

## 2019-02-09 DIAGNOSIS — H439 Unspecified disorder of vitreous body: Secondary | ICD-10-CM

## 2019-02-09 DIAGNOSIS — Z961 Presence of intraocular lens: Secondary | ICD-10-CM | POA: Insufficient documentation

## 2019-02-09 DIAGNOSIS — H3581 Retinal edema: Secondary | ICD-10-CM | POA: Diagnosis not present

## 2019-02-09 DIAGNOSIS — Z9842 Cataract extraction status, left eye: Secondary | ICD-10-CM

## 2019-02-09 DIAGNOSIS — H348312 Tributary (branch) retinal vein occlusion, right eye, stable: Secondary | ICD-10-CM | POA: Diagnosis not present

## 2019-02-09 HISTORY — DX: Presence of intraocular lens: Z96.1

## 2019-02-09 HISTORY — DX: Cataract extraction status, left eye: Z98.42

## 2019-02-09 HISTORY — DX: Unspecified iridocyclitis: H20.9

## 2019-02-09 HISTORY — DX: Retinal edema: H35.81

## 2019-02-09 HISTORY — DX: Unspecified disorder of vitreous body: H43.9

## 2019-02-17 DIAGNOSIS — H209 Unspecified iridocyclitis: Secondary | ICD-10-CM | POA: Diagnosis not present

## 2019-02-18 DIAGNOSIS — Z23 Encounter for immunization: Secondary | ICD-10-CM | POA: Diagnosis not present

## 2019-02-23 DIAGNOSIS — H439 Unspecified disorder of vitreous body: Secondary | ICD-10-CM | POA: Diagnosis not present

## 2019-02-23 DIAGNOSIS — H3581 Retinal edema: Secondary | ICD-10-CM | POA: Diagnosis not present

## 2019-02-23 DIAGNOSIS — Z961 Presence of intraocular lens: Secondary | ICD-10-CM | POA: Diagnosis not present

## 2019-02-23 DIAGNOSIS — Z9842 Cataract extraction status, left eye: Secondary | ICD-10-CM | POA: Diagnosis not present

## 2019-02-23 DIAGNOSIS — H348312 Tributary (branch) retinal vein occlusion, right eye, stable: Secondary | ICD-10-CM | POA: Diagnosis not present

## 2019-02-23 DIAGNOSIS — H209 Unspecified iridocyclitis: Secondary | ICD-10-CM | POA: Diagnosis not present

## 2019-02-26 DIAGNOSIS — S99911A Unspecified injury of right ankle, initial encounter: Secondary | ICD-10-CM | POA: Diagnosis not present

## 2019-02-26 DIAGNOSIS — S8261XA Displaced fracture of lateral malleolus of right fibula, initial encounter for closed fracture: Secondary | ICD-10-CM | POA: Diagnosis not present

## 2019-03-04 ENCOUNTER — Other Ambulatory Visit: Payer: Self-pay

## 2019-03-04 ENCOUNTER — Ambulatory Visit (INDEPENDENT_AMBULATORY_CARE_PROVIDER_SITE_OTHER): Payer: Medicare Other

## 2019-03-04 DIAGNOSIS — R002 Palpitations: Secondary | ICD-10-CM

## 2019-03-04 DIAGNOSIS — I10 Essential (primary) hypertension: Secondary | ICD-10-CM | POA: Diagnosis not present

## 2019-03-04 DIAGNOSIS — R0789 Other chest pain: Secondary | ICD-10-CM

## 2019-03-04 NOTE — Progress Notes (Signed)
Complete echocardiogram has been performed.  Jimmy Adileny Delon RDCS, RVT 

## 2019-03-08 DIAGNOSIS — S99911A Unspecified injury of right ankle, initial encounter: Secondary | ICD-10-CM | POA: Diagnosis not present

## 2019-03-08 DIAGNOSIS — S8261XA Displaced fracture of lateral malleolus of right fibula, initial encounter for closed fracture: Secondary | ICD-10-CM | POA: Diagnosis not present

## 2019-03-11 DIAGNOSIS — Z23 Encounter for immunization: Secondary | ICD-10-CM | POA: Diagnosis not present

## 2019-03-12 ENCOUNTER — Telehealth: Payer: Self-pay | Admitting: *Deleted

## 2019-03-12 MED ORDER — METOPROLOL SUCCINATE ER 50 MG PO TB24: 50.0000 mg | ORAL_TABLET | Freq: Every day | ORAL | 3 refills | Status: DC

## 2019-03-12 NOTE — Telephone Encounter (Signed)
See result note.  

## 2019-03-12 NOTE — Telephone Encounter (Signed)
-----   Message from Park Liter, MD sent at 03/10/2019  6:48 PM EST ----- Yes, we can switch bystolic to metoprolol succinate 50 mg po qd ----- Message ----- From: Jeanann Lewandowsky, RMA Sent: 03/10/2019  11:45 AM EST To: Park Liter, MD  Pt says he is on 3 heart meds and he is wondering if anything can be done different?  He states the Bystolic is real expensive.

## 2019-05-31 ENCOUNTER — Ambulatory Visit (INDEPENDENT_AMBULATORY_CARE_PROVIDER_SITE_OTHER): Payer: Medicare Other

## 2019-05-31 ENCOUNTER — Other Ambulatory Visit: Payer: Self-pay

## 2019-05-31 ENCOUNTER — Ambulatory Visit (INDEPENDENT_AMBULATORY_CARE_PROVIDER_SITE_OTHER): Payer: Medicare Other | Admitting: Podiatry

## 2019-05-31 ENCOUNTER — Other Ambulatory Visit: Payer: Self-pay | Admitting: Podiatry

## 2019-05-31 DIAGNOSIS — S90122A Contusion of left lesser toe(s) without damage to nail, initial encounter: Secondary | ICD-10-CM | POA: Diagnosis not present

## 2019-05-31 DIAGNOSIS — M79672 Pain in left foot: Secondary | ICD-10-CM

## 2019-06-16 DIAGNOSIS — N5201 Erectile dysfunction due to arterial insufficiency: Secondary | ICD-10-CM | POA: Diagnosis not present

## 2019-07-02 DIAGNOSIS — S5001XA Contusion of right elbow, initial encounter: Secondary | ICD-10-CM | POA: Diagnosis not present

## 2019-07-02 DIAGNOSIS — W010XXA Fall on same level from slipping, tripping and stumbling without subsequent striking against object, initial encounter: Secondary | ICD-10-CM | POA: Diagnosis not present

## 2019-07-02 DIAGNOSIS — S59901A Unspecified injury of right elbow, initial encounter: Secondary | ICD-10-CM | POA: Diagnosis not present

## 2019-07-02 DIAGNOSIS — S50311A Abrasion of right elbow, initial encounter: Secondary | ICD-10-CM | POA: Diagnosis not present

## 2019-07-05 DIAGNOSIS — Z6832 Body mass index (BMI) 32.0-32.9, adult: Secondary | ICD-10-CM | POA: Diagnosis not present

## 2019-07-05 DIAGNOSIS — M25521 Pain in right elbow: Secondary | ICD-10-CM | POA: Diagnosis not present

## 2019-07-05 DIAGNOSIS — S62101A Fracture of unspecified carpal bone, right wrist, initial encounter for closed fracture: Secondary | ICD-10-CM | POA: Diagnosis not present

## 2019-07-09 DIAGNOSIS — M25521 Pain in right elbow: Secondary | ICD-10-CM | POA: Diagnosis not present

## 2019-07-09 DIAGNOSIS — R0789 Other chest pain: Secondary | ICD-10-CM | POA: Diagnosis not present
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
## 2019-08-17 ENCOUNTER — Other Ambulatory Visit: Payer: Self-pay | Admitting: Podiatry

## 2019-08-17 DIAGNOSIS — S90122A Contusion of left lesser toe(s) without damage to nail, initial encounter: Secondary | ICD-10-CM

## 2019-08-19 DIAGNOSIS — K661 Hemoperitoneum: Secondary | ICD-10-CM | POA: Diagnosis not present

## 2019-08-19 DIAGNOSIS — S32302A Unspecified fracture of left ilium, initial encounter for closed fracture: Secondary | ICD-10-CM | POA: Diagnosis not present

## 2019-08-19 DIAGNOSIS — K5900 Constipation, unspecified: Secondary | ICD-10-CM | POA: Diagnosis not present

## 2019-08-19 DIAGNOSIS — G8911 Acute pain due to trauma: Secondary | ICD-10-CM | POA: Diagnosis not present

## 2019-08-19 DIAGNOSIS — S36899A Unspecified injury of other intra-abdominal organs, initial encounter: Secondary | ICD-10-CM | POA: Diagnosis not present

## 2019-08-19 DIAGNOSIS — T1490XA Injury, unspecified, initial encounter: Secondary | ICD-10-CM | POA: Diagnosis not present

## 2019-08-19 DIAGNOSIS — S22049A Unspecified fracture of fourth thoracic vertebra, initial encounter for closed fracture: Secondary | ICD-10-CM | POA: Diagnosis not present

## 2019-08-19 DIAGNOSIS — R269 Unspecified abnormalities of gait and mobility: Secondary | ICD-10-CM | POA: Diagnosis present

## 2019-08-19 DIAGNOSIS — Z7409 Other reduced mobility: Secondary | ICD-10-CM | POA: Diagnosis not present

## 2019-08-19 DIAGNOSIS — G4700 Insomnia, unspecified: Secondary | ICD-10-CM | POA: Diagnosis not present

## 2019-08-19 DIAGNOSIS — S022XXA Fracture of nasal bones, initial encounter for closed fracture: Secondary | ICD-10-CM | POA: Diagnosis not present

## 2019-08-19 DIAGNOSIS — Z041 Encounter for examination and observation following transport accident: Secondary | ICD-10-CM | POA: Diagnosis not present

## 2019-08-19 DIAGNOSIS — Z043 Encounter for examination and observation following other accident: Secondary | ICD-10-CM | POA: Diagnosis not present

## 2019-08-19 DIAGNOSIS — S36893A Laceration of other intra-abdominal organs, initial encounter: Secondary | ICD-10-CM | POA: Diagnosis not present

## 2019-08-19 DIAGNOSIS — R140 Abdominal distension (gaseous): Secondary | ICD-10-CM | POA: Diagnosis not present

## 2019-08-19 DIAGNOSIS — R918 Other nonspecific abnormal finding of lung field: Secondary | ICD-10-CM | POA: Diagnosis not present

## 2019-08-19 DIAGNOSIS — S32039A Unspecified fracture of third lumbar vertebra, initial encounter for closed fracture: Secondary | ICD-10-CM | POA: Diagnosis not present

## 2019-08-19 DIAGNOSIS — R600 Localized edema: Secondary | ICD-10-CM | POA: Diagnosis not present

## 2019-08-19 DIAGNOSIS — S2241XA Multiple fractures of ribs, right side, initial encounter for closed fracture: Secondary | ICD-10-CM | POA: Diagnosis not present

## 2019-08-19 DIAGNOSIS — S32019A Unspecified fracture of first lumbar vertebra, initial encounter for closed fracture: Secondary | ICD-10-CM | POA: Diagnosis present

## 2019-08-19 DIAGNOSIS — G8918 Other acute postprocedural pain: Secondary | ICD-10-CM | POA: Diagnosis not present

## 2019-08-19 DIAGNOSIS — N179 Acute kidney failure, unspecified: Secondary | ICD-10-CM | POA: Diagnosis not present

## 2019-08-19 DIAGNOSIS — J9811 Atelectasis: Secondary | ICD-10-CM | POA: Diagnosis not present

## 2019-08-19 DIAGNOSIS — R58 Hemorrhage, not elsewhere classified: Secondary | ICD-10-CM | POA: Diagnosis not present

## 2019-08-19 DIAGNOSIS — M7989 Other specified soft tissue disorders: Secondary | ICD-10-CM | POA: Diagnosis not present

## 2019-08-19 DIAGNOSIS — S2220XA Unspecified fracture of sternum, initial encounter for closed fracture: Secondary | ICD-10-CM | POA: Diagnosis not present

## 2019-08-19 DIAGNOSIS — Z7982 Long term (current) use of aspirin: Secondary | ICD-10-CM | POA: Diagnosis not present

## 2019-08-19 DIAGNOSIS — M25571 Pain in right ankle and joints of right foot: Secondary | ICD-10-CM | POA: Diagnosis not present

## 2019-08-19 DIAGNOSIS — Z79899 Other long term (current) drug therapy: Secondary | ICD-10-CM | POA: Diagnosis not present

## 2019-08-19 DIAGNOSIS — Z5331 Laparoscopic surgical procedure converted to open procedure: Secondary | ICD-10-CM | POA: Diagnosis not present

## 2019-08-19 DIAGNOSIS — K567 Ileus, unspecified: Secondary | ICD-10-CM | POA: Diagnosis not present

## 2019-08-19 DIAGNOSIS — I445 Left posterior fascicular block: Secondary | ICD-10-CM | POA: Diagnosis not present

## 2019-08-19 DIAGNOSIS — S32302B Unspecified fracture of left ilium, initial encounter for open fracture: Secondary | ICD-10-CM | POA: Diagnosis present

## 2019-08-19 DIAGNOSIS — I959 Hypotension, unspecified: Secondary | ICD-10-CM | POA: Diagnosis not present

## 2019-08-19 DIAGNOSIS — J984 Other disorders of lung: Secondary | ICD-10-CM | POA: Diagnosis not present

## 2019-08-19 DIAGNOSIS — S32309B Unspecified fracture of unspecified ilium, initial encounter for open fracture: Secondary | ICD-10-CM | POA: Diagnosis not present

## 2019-08-19 DIAGNOSIS — S32029A Unspecified fracture of second lumbar vertebra, initial encounter for closed fracture: Secondary | ICD-10-CM | POA: Diagnosis present

## 2019-08-19 DIAGNOSIS — S301XXA Contusion of abdominal wall, initial encounter: Secondary | ICD-10-CM | POA: Diagnosis present

## 2019-08-19 DIAGNOSIS — S32049A Unspecified fracture of fourth lumbar vertebra, initial encounter for closed fracture: Secondary | ICD-10-CM | POA: Diagnosis not present

## 2019-08-19 DIAGNOSIS — F430 Acute stress reaction: Secondary | ICD-10-CM | POA: Diagnosis not present

## 2019-08-19 DIAGNOSIS — K9189 Other postprocedural complications and disorders of digestive system: Secondary | ICD-10-CM | POA: Diagnosis present

## 2019-08-19 DIAGNOSIS — Z9189 Other specified personal risk factors, not elsewhere classified: Secondary | ICD-10-CM | POA: Diagnosis not present

## 2019-08-19 DIAGNOSIS — Z880 Allergy status to penicillin: Secondary | ICD-10-CM | POA: Diagnosis not present

## 2019-08-19 DIAGNOSIS — R9431 Abnormal electrocardiogram [ECG] [EKG]: Secondary | ICD-10-CM | POA: Diagnosis not present

## 2019-08-19 DIAGNOSIS — J90 Pleural effusion, not elsewhere classified: Secondary | ICD-10-CM | POA: Diagnosis not present

## 2019-08-19 DIAGNOSIS — R609 Edema, unspecified: Secondary | ICD-10-CM | POA: Diagnosis not present

## 2019-08-19 DIAGNOSIS — R069 Unspecified abnormalities of breathing: Secondary | ICD-10-CM | POA: Diagnosis not present

## 2019-08-19 DIAGNOSIS — S36438A Laceration of other part of small intestine, initial encounter: Secondary | ICD-10-CM | POA: Diagnosis not present

## 2019-08-19 DIAGNOSIS — Z9181 History of falling: Secondary | ICD-10-CM | POA: Diagnosis not present

## 2019-08-19 DIAGNOSIS — K922 Gastrointestinal hemorrhage, unspecified: Secondary | ICD-10-CM | POA: Diagnosis not present

## 2019-08-19 DIAGNOSIS — S2222XA Fracture of body of sternum, initial encounter for closed fracture: Secondary | ICD-10-CM | POA: Diagnosis present

## 2019-08-19 DIAGNOSIS — I10 Essential (primary) hypertension: Secondary | ICD-10-CM | POA: Diagnosis present

## 2019-08-19 DIAGNOSIS — I517 Cardiomegaly: Secondary | ICD-10-CM | POA: Diagnosis not present

## 2019-08-19 DIAGNOSIS — R079 Chest pain, unspecified: Secondary | ICD-10-CM | POA: Diagnosis not present

## 2019-08-19 DIAGNOSIS — D62 Acute posthemorrhagic anemia: Secondary | ICD-10-CM | POA: Diagnosis present

## 2019-08-19 HISTORY — PX: FRACTURE SURGERY: SHX138

## 2019-08-20 DIAGNOSIS — M7989 Other specified soft tissue disorders: Secondary | ICD-10-CM | POA: Diagnosis not present

## 2019-08-20 DIAGNOSIS — M25571 Pain in right ankle and joints of right foot: Secondary | ICD-10-CM | POA: Diagnosis not present

## 2019-08-20 DIAGNOSIS — S32302A Unspecified fracture of left ilium, initial encounter for closed fracture: Secondary | ICD-10-CM | POA: Diagnosis not present

## 2019-08-20 DIAGNOSIS — Z043 Encounter for examination and observation following other accident: Secondary | ICD-10-CM | POA: Diagnosis not present

## 2019-08-21 DIAGNOSIS — T1490XA Injury, unspecified, initial encounter: Secondary | ICD-10-CM | POA: Diagnosis not present

## 2019-08-22 DIAGNOSIS — R140 Abdominal distension (gaseous): Secondary | ICD-10-CM | POA: Diagnosis not present

## 2019-08-23 DIAGNOSIS — R600 Localized edema: Secondary | ICD-10-CM | POA: Diagnosis not present

## 2019-08-23 DIAGNOSIS — J90 Pleural effusion, not elsewhere classified: Secondary | ICD-10-CM | POA: Diagnosis not present

## 2019-08-24 DIAGNOSIS — R079 Chest pain, unspecified: Secondary | ICD-10-CM | POA: Diagnosis not present

## 2019-08-25 DIAGNOSIS — R918 Other nonspecific abnormal finding of lung field: Secondary | ICD-10-CM | POA: Diagnosis not present

## 2019-08-25 DIAGNOSIS — J9811 Atelectasis: Secondary | ICD-10-CM | POA: Diagnosis not present

## 2019-08-26 DIAGNOSIS — M109 Gout, unspecified: Secondary | ICD-10-CM | POA: Diagnosis not present

## 2019-08-26 DIAGNOSIS — G4709 Other insomnia: Secondary | ICD-10-CM | POA: Diagnosis present

## 2019-08-26 DIAGNOSIS — F329 Major depressive disorder, single episode, unspecified: Secondary | ICD-10-CM | POA: Diagnosis present

## 2019-08-26 DIAGNOSIS — G894 Chronic pain syndrome: Secondary | ICD-10-CM | POA: Diagnosis present

## 2019-08-26 DIAGNOSIS — K661 Hemoperitoneum: Secondary | ICD-10-CM | POA: Diagnosis not present

## 2019-08-26 DIAGNOSIS — S32302D Unspecified fracture of left ilium, subsequent encounter for fracture with routine healing: Secondary | ICD-10-CM | POA: Diagnosis not present

## 2019-08-26 DIAGNOSIS — S2241XD Multiple fractures of ribs, right side, subsequent encounter for fracture with routine healing: Secondary | ICD-10-CM | POA: Diagnosis not present

## 2019-08-26 DIAGNOSIS — S32009A Unspecified fracture of unspecified lumbar vertebra, initial encounter for closed fracture: Secondary | ICD-10-CM | POA: Diagnosis not present

## 2019-08-26 DIAGNOSIS — S32309B Unspecified fracture of unspecified ilium, initial encounter for open fracture: Secondary | ICD-10-CM | POA: Diagnosis not present

## 2019-08-26 DIAGNOSIS — M792 Neuralgia and neuritis, unspecified: Secondary | ICD-10-CM | POA: Diagnosis present

## 2019-08-26 DIAGNOSIS — Z9181 History of falling: Secondary | ICD-10-CM | POA: Diagnosis not present

## 2019-08-26 DIAGNOSIS — K219 Gastro-esophageal reflux disease without esophagitis: Secondary | ICD-10-CM | POA: Diagnosis present

## 2019-08-26 DIAGNOSIS — F430 Acute stress reaction: Secondary | ICD-10-CM | POA: Diagnosis not present

## 2019-08-26 DIAGNOSIS — G4733 Obstructive sleep apnea (adult) (pediatric): Secondary | ICD-10-CM | POA: Diagnosis present

## 2019-08-26 DIAGNOSIS — R143 Flatulence: Secondary | ICD-10-CM | POA: Diagnosis not present

## 2019-08-26 DIAGNOSIS — Z0389 Encounter for observation for other suspected diseases and conditions ruled out: Secondary | ICD-10-CM | POA: Diagnosis not present

## 2019-08-26 DIAGNOSIS — G4700 Insomnia, unspecified: Secondary | ICD-10-CM | POA: Diagnosis not present

## 2019-08-26 DIAGNOSIS — S36408D Unspecified injury of other part of small intestine, subsequent encounter: Secondary | ICD-10-CM | POA: Diagnosis not present

## 2019-08-26 DIAGNOSIS — R195 Other fecal abnormalities: Secondary | ICD-10-CM | POA: Diagnosis not present

## 2019-08-26 DIAGNOSIS — S32029D Unspecified fracture of second lumbar vertebra, subsequent encounter for fracture with routine healing: Secondary | ICD-10-CM | POA: Diagnosis not present

## 2019-08-26 DIAGNOSIS — I10 Essential (primary) hypertension: Secondary | ICD-10-CM | POA: Diagnosis not present

## 2019-08-26 DIAGNOSIS — S2239XA Fracture of one rib, unspecified side, initial encounter for closed fracture: Secondary | ICD-10-CM | POA: Diagnosis not present

## 2019-08-26 DIAGNOSIS — R0602 Shortness of breath: Secondary | ICD-10-CM | POA: Diagnosis not present

## 2019-08-26 DIAGNOSIS — S32039D Unspecified fracture of third lumbar vertebra, subsequent encounter for fracture with routine healing: Secondary | ICD-10-CM | POA: Diagnosis not present

## 2019-08-26 DIAGNOSIS — Z6841 Body Mass Index (BMI) 40.0 and over, adult: Secondary | ICD-10-CM | POA: Diagnosis not present

## 2019-08-26 DIAGNOSIS — T07XXXA Unspecified multiple injuries, initial encounter: Secondary | ICD-10-CM | POA: Diagnosis not present

## 2019-08-26 DIAGNOSIS — S32049D Unspecified fracture of fourth lumbar vertebra, subsequent encounter for fracture with routine healing: Secondary | ICD-10-CM | POA: Diagnosis not present

## 2019-08-26 DIAGNOSIS — Z9189 Other specified personal risk factors, not elsewhere classified: Secondary | ICD-10-CM | POA: Diagnosis not present

## 2019-08-26 DIAGNOSIS — S32019D Unspecified fracture of first lumbar vertebra, subsequent encounter for fracture with routine healing: Secondary | ICD-10-CM | POA: Diagnosis not present

## 2019-08-26 DIAGNOSIS — M958 Other specified acquired deformities of musculoskeletal system: Secondary | ICD-10-CM | POA: Diagnosis present

## 2019-08-26 DIAGNOSIS — F5089 Other specified eating disorder: Secondary | ICD-10-CM | POA: Diagnosis present

## 2019-08-26 DIAGNOSIS — F419 Anxiety disorder, unspecified: Secondary | ICD-10-CM | POA: Diagnosis present

## 2019-08-26 DIAGNOSIS — S31614D Laceration without foreign body of abdominal wall, left lower quadrant with penetration into peritoneal cavity, subsequent encounter: Secondary | ICD-10-CM | POA: Diagnosis not present

## 2019-08-26 DIAGNOSIS — G8911 Acute pain due to trauma: Secondary | ICD-10-CM | POA: Diagnosis not present

## 2019-08-26 DIAGNOSIS — M791 Myalgia, unspecified site: Secondary | ICD-10-CM | POA: Diagnosis not present

## 2019-08-26 DIAGNOSIS — Z9049 Acquired absence of other specified parts of digestive tract: Secondary | ICD-10-CM | POA: Diagnosis not present

## 2019-08-26 DIAGNOSIS — Z7409 Other reduced mobility: Secondary | ICD-10-CM | POA: Diagnosis not present

## 2019-08-26 DIAGNOSIS — Z48815 Encounter for surgical aftercare following surgery on the digestive system: Secondary | ICD-10-CM | POA: Diagnosis not present

## 2019-08-26 DIAGNOSIS — Z79899 Other long term (current) drug therapy: Secondary | ICD-10-CM | POA: Diagnosis not present

## 2019-08-26 DIAGNOSIS — S2220XD Unspecified fracture of sternum, subsequent encounter for fracture with routine healing: Secondary | ICD-10-CM | POA: Diagnosis not present

## 2019-08-26 DIAGNOSIS — M1A9XX0 Chronic gout, unspecified, without tophus (tophi): Secondary | ICD-10-CM | POA: Diagnosis present

## 2019-08-26 DIAGNOSIS — S022XXD Fracture of nasal bones, subsequent encounter for fracture with routine healing: Secondary | ICD-10-CM | POA: Diagnosis not present

## 2019-08-27 DIAGNOSIS — R0602 Shortness of breath: Secondary | ICD-10-CM | POA: Diagnosis not present

## 2019-09-03 DIAGNOSIS — R195 Other fecal abnormalities: Secondary | ICD-10-CM | POA: Diagnosis not present

## 2019-09-03 DIAGNOSIS — R143 Flatulence: Secondary | ICD-10-CM | POA: Diagnosis not present

## 2019-09-08 DIAGNOSIS — S32038A Other fracture of third lumbar vertebra, initial encounter for closed fracture: Secondary | ICD-10-CM | POA: Diagnosis not present

## 2019-09-08 DIAGNOSIS — Z9049 Acquired absence of other specified parts of digestive tract: Secondary | ICD-10-CM | POA: Diagnosis not present

## 2019-09-08 DIAGNOSIS — S2222XA Fracture of body of sternum, initial encounter for closed fracture: Secondary | ICD-10-CM | POA: Diagnosis not present

## 2019-09-08 DIAGNOSIS — S32028A Other fracture of second lumbar vertebra, initial encounter for closed fracture: Secondary | ICD-10-CM | POA: Diagnosis not present

## 2019-09-08 DIAGNOSIS — S32018A Other fracture of first lumbar vertebra, initial encounter for closed fracture: Secondary | ICD-10-CM | POA: Diagnosis not present

## 2019-09-08 DIAGNOSIS — K661 Hemoperitoneum: Secondary | ICD-10-CM | POA: Diagnosis not present

## 2019-09-08 DIAGNOSIS — S32301B Unspecified fracture of right ilium, initial encounter for open fracture: Secondary | ICD-10-CM | POA: Diagnosis not present

## 2019-09-08 DIAGNOSIS — E669 Obesity, unspecified: Secondary | ICD-10-CM | POA: Diagnosis not present

## 2019-09-08 DIAGNOSIS — S022XXA Fracture of nasal bones, initial encounter for closed fracture: Secondary | ICD-10-CM | POA: Diagnosis not present

## 2019-09-08 DIAGNOSIS — M109 Gout, unspecified: Secondary | ICD-10-CM | POA: Diagnosis not present

## 2019-09-08 DIAGNOSIS — Z48815 Encounter for surgical aftercare following surgery on the digestive system: Secondary | ICD-10-CM | POA: Diagnosis not present

## 2019-09-08 DIAGNOSIS — S2241XA Multiple fractures of ribs, right side, initial encounter for closed fracture: Secondary | ICD-10-CM | POA: Diagnosis not present

## 2019-09-08 DIAGNOSIS — S32048A Other fracture of fourth lumbar vertebra, initial encounter for closed fracture: Secondary | ICD-10-CM | POA: Diagnosis not present

## 2019-09-08 DIAGNOSIS — I10 Essential (primary) hypertension: Secondary | ICD-10-CM | POA: Diagnosis not present

## 2019-09-09 ENCOUNTER — Other Ambulatory Visit: Payer: Self-pay

## 2019-09-09 ENCOUNTER — Telehealth: Payer: Self-pay | Admitting: Cardiology

## 2019-09-09 ENCOUNTER — Ambulatory Visit (INDEPENDENT_AMBULATORY_CARE_PROVIDER_SITE_OTHER): Payer: Medicare Other | Admitting: Cardiology

## 2019-09-09 VITALS — BP 106/70 | HR 72 | Ht 73.0 in | Wt 239.6 lb

## 2019-09-09 DIAGNOSIS — Z48815 Encounter for surgical aftercare following surgery on the digestive system: Secondary | ICD-10-CM | POA: Diagnosis not present

## 2019-09-09 DIAGNOSIS — I10 Essential (primary) hypertension: Secondary | ICD-10-CM

## 2019-09-09 DIAGNOSIS — R0789 Other chest pain: Secondary | ICD-10-CM | POA: Diagnosis not present

## 2019-09-09 DIAGNOSIS — I712 Thoracic aortic aneurysm, without rupture: Secondary | ICD-10-CM

## 2019-09-09 DIAGNOSIS — K219 Gastro-esophageal reflux disease without esophagitis: Secondary | ICD-10-CM | POA: Insufficient documentation

## 2019-09-09 DIAGNOSIS — I7121 Aneurysm of the ascending aorta, without rupture: Secondary | ICD-10-CM | POA: Insufficient documentation

## 2019-09-09 DIAGNOSIS — Z9049 Acquired absence of other specified parts of digestive tract: Secondary | ICD-10-CM | POA: Diagnosis not present

## 2019-09-09 DIAGNOSIS — S32038A Other fracture of third lumbar vertebra, initial encounter for closed fracture: Secondary | ICD-10-CM | POA: Diagnosis not present

## 2019-09-09 DIAGNOSIS — S32048A Other fracture of fourth lumbar vertebra, initial encounter for closed fracture: Secondary | ICD-10-CM | POA: Diagnosis not present

## 2019-09-09 DIAGNOSIS — R002 Palpitations: Secondary | ICD-10-CM

## 2019-09-09 DIAGNOSIS — S32028A Other fracture of second lumbar vertebra, initial encounter for closed fracture: Secondary | ICD-10-CM | POA: Diagnosis not present

## 2019-09-09 DIAGNOSIS — S32018A Other fracture of first lumbar vertebra, initial encounter for closed fracture: Secondary | ICD-10-CM | POA: Diagnosis not present

## 2019-09-09 HISTORY — DX: Aneurysm of the ascending aorta, without rupture: I71.21

## 2019-09-09 HISTORY — DX: Thoracic aortic aneurysm, without rupture: I71.2

## 2019-09-09 NOTE — Patient Instructions (Addendum)
Medication Instructions:  Your physician recommends that you continue on your current medications as directed. Please refer to the Current Medication list given to you today.  *If you need a refill on your cardiac medications before your next appointment, please call your pharmacy*   Lab Work: None If you have labs (blood work) drawn today and your tests are completely normal, you will receive your results only by: MyChart Message (if you have MyChart) OR A paper copy in the mail If you have any lab test that is abnormal or we need to change your treatment, we will call you to review the results.   Testing/Procedures: Your physician has requested that you have an echocardiogram. Echocardiography is a painless test that uses sound waves to create images of your heart. It provides your doctor with information about the size and shape of your heart and how well your heart's chambers and valves are working. This procedure takes approximately one hour. There are no restrictions for this procedure.    Follow-Up: At [facility], you and your health needs are our priority.  As part of our continuing mission to provide you with exceptional heart care, we have created designated Provider Care Teams.  These Care Teams include your primary Cardiologist (physician) and Advanced Practice Providers (APPs -  Physician Assistants and Nurse Practitioners) who all work together to provide you with the care you need, when you need it.  We recommend signing up for the patient portal called "MyChart".  Sign up information is provided on this After Visit Summary.  MyChart is used to connect with patients for Virtual Visits (Telemedicine).  Patients are able to view lab/test results, encounter notes, upcoming appointments, etc.  Non-urgent messages can be sent to your provider as well.   To learn more about what you can do with MyChart, go to [email or web address].    Your next appointment:   2  month(s)  The format for your next appointment:   In Person  Provider:   Robert Krasowski, MD   Other Instructions   

## 2019-09-09 NOTE — Telephone Encounter (Signed)
New Message  Same day appt scheduled with Dr. Agustin Cree today at 09/09/19 at 2:20 pm.
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
## 2019-09-10 DIAGNOSIS — S32048A Other fracture of fourth lumbar vertebra, initial encounter for closed fracture: Secondary | ICD-10-CM | POA: Diagnosis not present

## 2019-09-10 DIAGNOSIS — Z48815 Encounter for surgical aftercare following surgery on the digestive system: Secondary | ICD-10-CM | POA: Diagnosis not present

## 2019-09-10 DIAGNOSIS — S32038A Other fracture of third lumbar vertebra, initial encounter for closed fracture: Secondary | ICD-10-CM | POA: Diagnosis not present

## 2019-09-10 DIAGNOSIS — S32028A Other fracture of second lumbar vertebra, initial encounter for closed fracture: Secondary | ICD-10-CM | POA: Diagnosis not present

## 2019-09-10 DIAGNOSIS — Z9049 Acquired absence of other specified parts of digestive tract: Secondary | ICD-10-CM | POA: Diagnosis not present

## 2019-09-10 DIAGNOSIS — S32018A Other fracture of first lumbar vertebra, initial encounter for closed fracture: Secondary | ICD-10-CM | POA: Diagnosis not present

## 2019-09-13 ENCOUNTER — Ambulatory Visit: Payer: Medicare Other | Admitting: Cardiology

## 2019-09-13 DIAGNOSIS — Z9049 Acquired absence of other specified parts of digestive tract: Secondary | ICD-10-CM | POA: Diagnosis not present

## 2019-09-13 DIAGNOSIS — Z48815 Encounter for surgical aftercare following surgery on the digestive system: Secondary | ICD-10-CM | POA: Diagnosis not present

## 2019-09-13 DIAGNOSIS — Z6832 Body mass index (BMI) 32.0-32.9, adult: Secondary | ICD-10-CM | POA: Diagnosis not present

## 2019-09-13 DIAGNOSIS — S32048A Other fracture of fourth lumbar vertebra, initial encounter for closed fracture: Secondary | ICD-10-CM | POA: Diagnosis not present

## 2019-09-13 DIAGNOSIS — S32038A Other fracture of third lumbar vertebra, initial encounter for closed fracture: Secondary | ICD-10-CM | POA: Diagnosis not present

## 2019-09-13 DIAGNOSIS — S32018A Other fracture of first lumbar vertebra, initial encounter for closed fracture: Secondary | ICD-10-CM | POA: Diagnosis not present

## 2019-09-13 DIAGNOSIS — S2239XA Fracture of one rib, unspecified side, initial encounter for closed fracture: Secondary | ICD-10-CM | POA: Diagnosis not present

## 2019-09-13 DIAGNOSIS — S32028A Other fracture of second lumbar vertebra, initial encounter for closed fracture: Secondary | ICD-10-CM | POA: Diagnosis not present

## 2019-09-13 DIAGNOSIS — S32009A Unspecified fracture of unspecified lumbar vertebra, initial encounter for closed fracture: Secondary | ICD-10-CM | POA: Diagnosis not present

## 2019-09-13 DIAGNOSIS — I10 Essential (primary) hypertension: Secondary | ICD-10-CM | POA: Diagnosis not present

## 2019-09-14 DIAGNOSIS — S32028A Other fracture of second lumbar vertebra, initial encounter for closed fracture: Secondary | ICD-10-CM | POA: Diagnosis not present

## 2019-09-14 DIAGNOSIS — Z9049 Acquired absence of other specified parts of digestive tract: Secondary | ICD-10-CM | POA: Diagnosis not present

## 2019-09-14 DIAGNOSIS — S32038A Other fracture of third lumbar vertebra, initial encounter for closed fracture: Secondary | ICD-10-CM | POA: Diagnosis not present

## 2019-09-14 DIAGNOSIS — Z48815 Encounter for surgical aftercare following surgery on the digestive system: Secondary | ICD-10-CM | POA: Diagnosis not present

## 2019-09-14 DIAGNOSIS — S32018A Other fracture of first lumbar vertebra, initial encounter for closed fracture: Secondary | ICD-10-CM | POA: Diagnosis not present

## 2019-09-14 DIAGNOSIS — S32048A Other fracture of fourth lumbar vertebra, initial encounter for closed fracture: Secondary | ICD-10-CM | POA: Diagnosis not present

## 2019-09-15 DIAGNOSIS — Z48815 Encounter for surgical aftercare following surgery on the digestive system: Secondary | ICD-10-CM | POA: Diagnosis not present

## 2019-09-15 DIAGNOSIS — S32048A Other fracture of fourth lumbar vertebra, initial encounter for closed fracture: Secondary | ICD-10-CM | POA: Diagnosis not present

## 2019-09-15 DIAGNOSIS — L929 Granulomatous disorder of the skin and subcutaneous tissue, unspecified: Secondary | ICD-10-CM

## 2019-09-15 DIAGNOSIS — S32028A Other fracture of second lumbar vertebra, initial encounter for closed fracture: Secondary | ICD-10-CM | POA: Diagnosis not present

## 2019-09-15 DIAGNOSIS — S32018A Other fracture of first lumbar vertebra, initial encounter for closed fracture: Secondary | ICD-10-CM | POA: Diagnosis not present

## 2019-09-15 DIAGNOSIS — Z9049 Acquired absence of other specified parts of digestive tract: Secondary | ICD-10-CM | POA: Diagnosis not present

## 2019-09-15 DIAGNOSIS — S32038A Other fracture of third lumbar vertebra, initial encounter for closed fracture: Secondary | ICD-10-CM | POA: Diagnosis not present

## 2019-09-15 HISTORY — DX: Granulomatous disorder of the skin and subcutaneous tissue, unspecified: L92.9

## 2019-09-17 DIAGNOSIS — S32048A Other fracture of fourth lumbar vertebra, initial encounter for closed fracture: Secondary | ICD-10-CM | POA: Diagnosis not present

## 2019-09-17 DIAGNOSIS — Z48815 Encounter for surgical aftercare following surgery on the digestive system: Secondary | ICD-10-CM | POA: Diagnosis not present

## 2019-09-17 DIAGNOSIS — S32038A Other fracture of third lumbar vertebra, initial encounter for closed fracture: Secondary | ICD-10-CM | POA: Diagnosis not present

## 2019-09-17 DIAGNOSIS — Z9049 Acquired absence of other specified parts of digestive tract: Secondary | ICD-10-CM | POA: Diagnosis not present

## 2019-09-17 DIAGNOSIS — S32028A Other fracture of second lumbar vertebra, initial encounter for closed fracture: Secondary | ICD-10-CM | POA: Diagnosis not present

## 2019-09-17 DIAGNOSIS — S32018A Other fracture of first lumbar vertebra, initial encounter for closed fracture: Secondary | ICD-10-CM | POA: Diagnosis not present

## 2019-09-20 DIAGNOSIS — S022XXA Fracture of nasal bones, initial encounter for closed fracture: Secondary | ICD-10-CM | POA: Diagnosis not present

## 2019-09-20 DIAGNOSIS — S2241XA Multiple fractures of ribs, right side, initial encounter for closed fracture: Secondary | ICD-10-CM | POA: Diagnosis not present

## 2019-09-23 DIAGNOSIS — Z9049 Acquired absence of other specified parts of digestive tract: Secondary | ICD-10-CM | POA: Diagnosis not present

## 2019-09-23 DIAGNOSIS — Z48815 Encounter for surgical aftercare following surgery on the digestive system: Secondary | ICD-10-CM | POA: Diagnosis not present

## 2019-09-23 DIAGNOSIS — S32028A Other fracture of second lumbar vertebra, initial encounter for closed fracture: Secondary | ICD-10-CM | POA: Diagnosis not present

## 2019-09-23 DIAGNOSIS — S32018A Other fracture of first lumbar vertebra, initial encounter for closed fracture: Secondary | ICD-10-CM | POA: Diagnosis not present

## 2019-09-23 DIAGNOSIS — S32048A Other fracture of fourth lumbar vertebra, initial encounter for closed fracture: Secondary | ICD-10-CM | POA: Diagnosis not present

## 2019-09-23 DIAGNOSIS — S32038A Other fracture of third lumbar vertebra, initial encounter for closed fracture: Secondary | ICD-10-CM | POA: Diagnosis not present

## 2019-09-29 ENCOUNTER — Ambulatory Visit (INDEPENDENT_AMBULATORY_CARE_PROVIDER_SITE_OTHER): Payer: Medicare Other

## 2019-09-29 ENCOUNTER — Other Ambulatory Visit: Payer: Self-pay

## 2019-09-29 DIAGNOSIS — R0789 Other chest pain: Secondary | ICD-10-CM | POA: Diagnosis not present

## 2019-09-29 DIAGNOSIS — L98492 Non-pressure chronic ulcer of skin of other sites with fat layer exposed: Secondary | ICD-10-CM

## 2019-09-29 HISTORY — DX: Non-pressure chronic ulcer of skin of other sites with fat layer exposed: L98.492

## 2019-09-29 LAB — ECHOCARDIOGRAM COMPLETE
P 1/2 time: 536 msec
S' Lateral: 3.6 cm

## 2019-09-29 NOTE — Progress Notes (Signed)
Complete echocardiogram has been performed.  Jimmy Lurline Caver RDCS, RVT 

## 2019-09-30 DIAGNOSIS — L98492 Non-pressure chronic ulcer of skin of other sites with fat layer exposed: Secondary | ICD-10-CM | POA: Diagnosis not present

## 2019-09-30 DIAGNOSIS — Z48815 Encounter for surgical aftercare following surgery on the digestive system: Secondary | ICD-10-CM | POA: Diagnosis not present

## 2019-10-06 DIAGNOSIS — T148XXA Other injury of unspecified body region, initial encounter: Secondary | ICD-10-CM | POA: Diagnosis not present

## 2019-10-06 DIAGNOSIS — S71019A Laceration without foreign body, unspecified hip, initial encounter: Secondary | ICD-10-CM | POA: Diagnosis not present

## 2019-11-01 ENCOUNTER — Other Ambulatory Visit: Payer: Self-pay | Admitting: Cardiology

## 2019-11-01 DIAGNOSIS — I10 Essential (primary) hypertension: Secondary | ICD-10-CM

## 2019-11-01 NOTE — Telephone Encounter (Signed)
*  STAT* If patient is at the pharmacy, call can be transferred to refill team.   1. Which medications need to be refilled? (please list name of each medication and dose if known) losartan 50mg   2. Which pharmacy/location (including street and city if local pharmacy) is medication to be sent to? [id number] - [place], [place] [address]  3. Do they need a 30 day or 90 day supply? 90 day Patient states he was on this medication in the past.  He was recently in the hospital in [place] they prescribed him this as well.

## 2019-11-04 MED ORDER — LOSARTAN POTASSIUM 50 MG PO TABS: 50.0000 mg | ORAL_TABLET | Freq: Every day | ORAL | 3 refills | Status: DC

## 2019-11-04 NOTE — Telephone Encounter (Signed)
Spoke to the patient just now and got his refill sent in for him. I also let him know that Dr. Raliegh Ip would like for him to have blood work done at his earliest convenience. Patient verbalizes understanding and thanks me for the call back.

## 2019-11-04 NOTE — Telephone Encounter (Signed)
It should be fine to give refill for that.  But please bring patient to have Chem-7 done

## 2019-11-17 ENCOUNTER — Other Ambulatory Visit: Payer: Self-pay

## 2019-11-18 ENCOUNTER — Other Ambulatory Visit: Payer: Self-pay

## 2019-11-18 ENCOUNTER — Ambulatory Visit (INDEPENDENT_AMBULATORY_CARE_PROVIDER_SITE_OTHER): Payer: Medicare Other | Admitting: Cardiology

## 2019-11-18 ENCOUNTER — Encounter: Payer: Self-pay | Admitting: Cardiology

## 2019-11-18 VITALS — BP 110/74 | HR 64 | Ht 73.0 in | Wt 228.0 lb

## 2019-11-18 DIAGNOSIS — I712 Thoracic aortic aneurysm, without rupture: Secondary | ICD-10-CM

## 2019-11-18 DIAGNOSIS — R0789 Other chest pain: Secondary | ICD-10-CM | POA: Diagnosis not present

## 2019-11-18 DIAGNOSIS — I7121 Aneurysm of the ascending aorta, without rupture: Secondary | ICD-10-CM

## 2019-11-18 DIAGNOSIS — I10 Essential (primary) hypertension: Secondary | ICD-10-CM | POA: Diagnosis not present
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
## 2019-11-18 NOTE — Patient Instructions (Signed)

## 2019-11-21 IMAGING — CT CT ANGIO CHEST
2 of 7 series · 18 of 46 positions shown · IV contrast (iopamidol)
Comparison: Prior CT scan of the abdomen and pelvis 05/22/2016

CLINICAL DATA: 71-year-old male with a history of thoracic aortic
aneurysm

EXAM:
CT ANGIOGRAPHY CHEST WITH CONTRAST
TECHNIQUE: Multidetector CT imaging of the chest was performed using the
standard protocol during bolus administration of intravenous
contrast. Multiplanar CT image reconstructions and MIPs were
obtained to evaluate the vascular anatomy.
CONTRAST:  100mL EH4TC0-9VF IOPAMIDOL (EH4TC0-9VF) INJECTION 76%

[Series 4: aorta 3.0 i31f 2 · axial · 0.78mm/px · z∈[-391,-58]mm · 15 of 121 slices shown]
[im 5/121  lung]
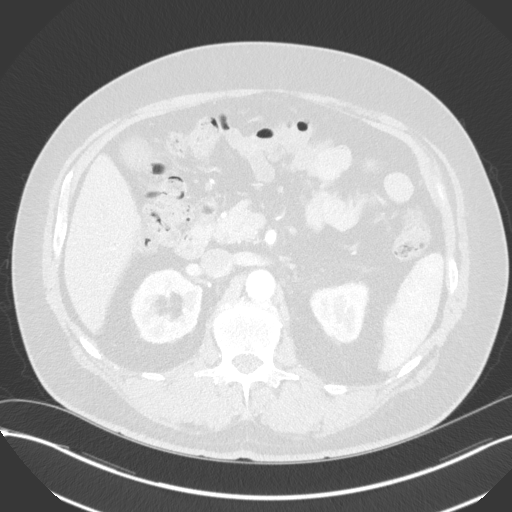
[im 14/121  soft-tissue]
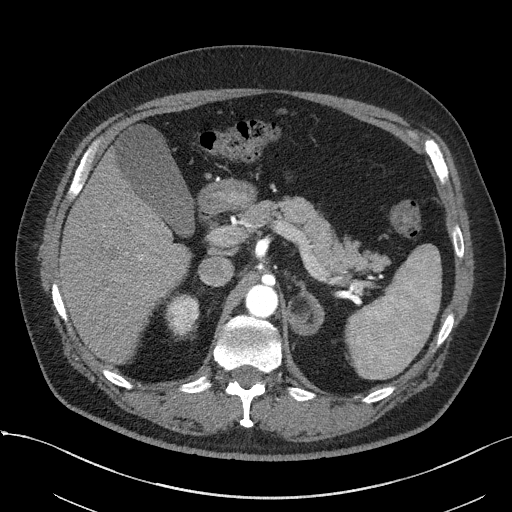
[im 23/121  lung]
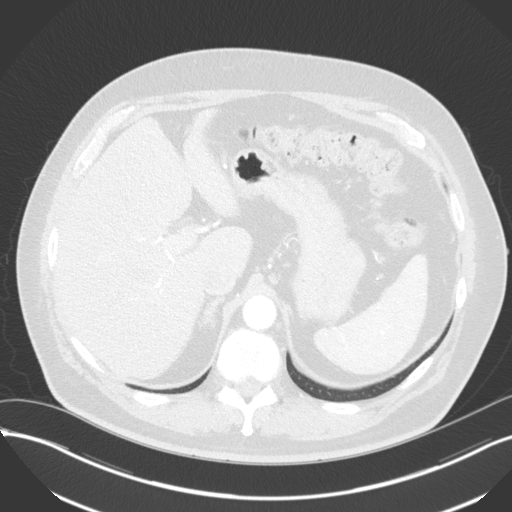
[im 32/121  soft-tissue]
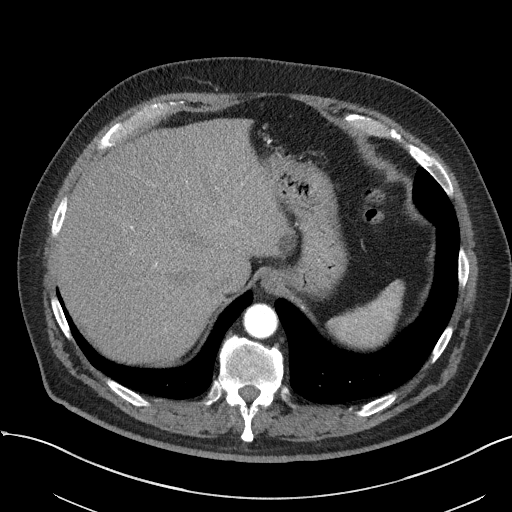
[im 36/121  lung]
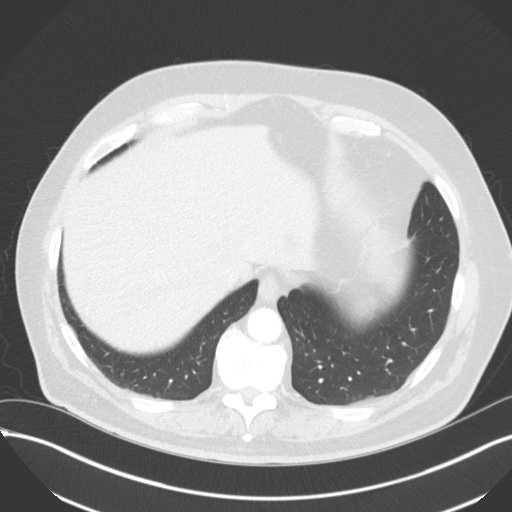
[im 45/121  soft-tissue]
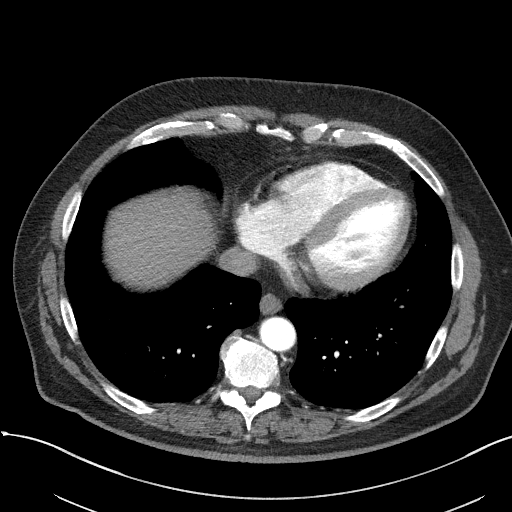
[im 54/121  lung]
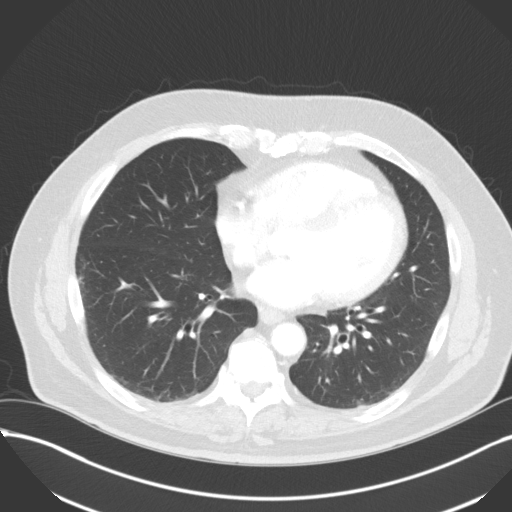
[im 63/121  soft-tissue]
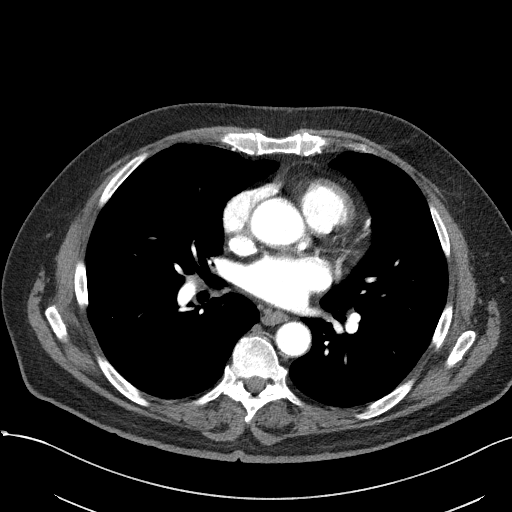
[im 67/121  lung]
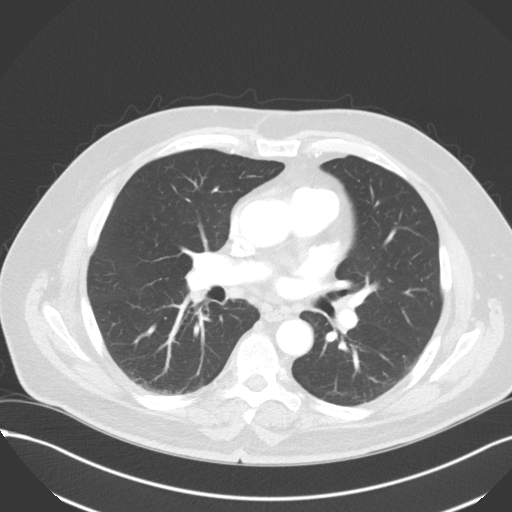
[im 76/121  soft-tissue]
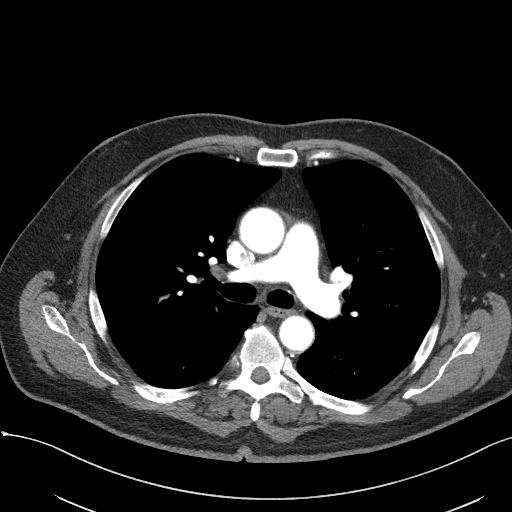
[im 85/121  lung]
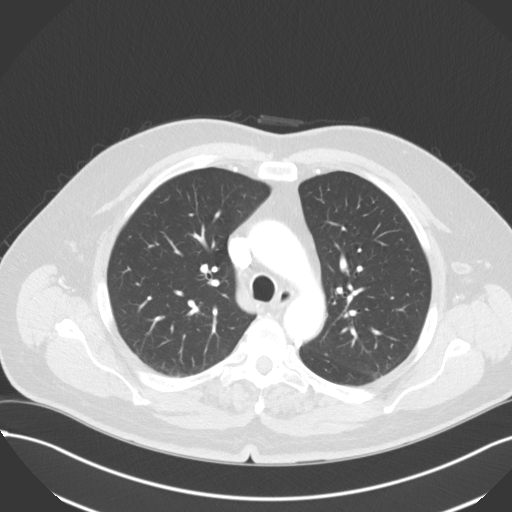
[im 89/121  soft-tissue]
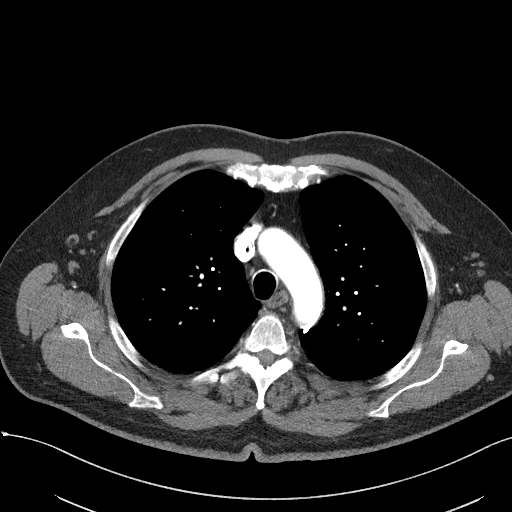
[im 98/121  lung]
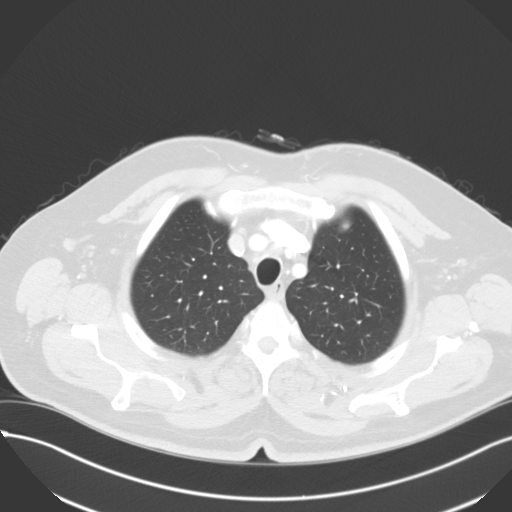
[im 107/121  soft-tissue]
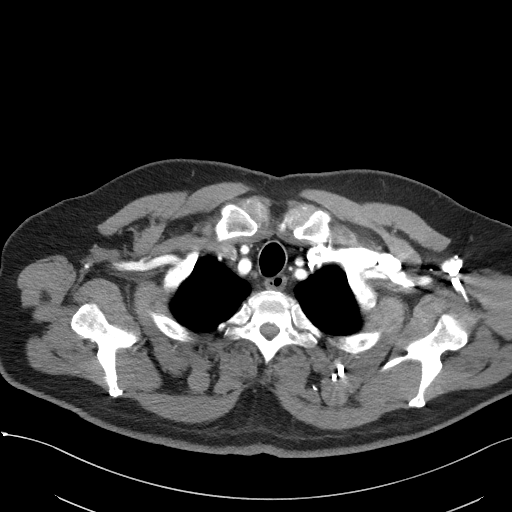
[im 116/121  lung]
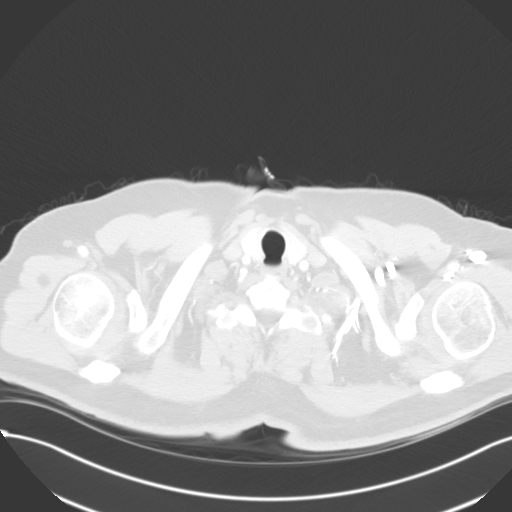

[Series 7: coronals · coronal · 0.71mm/px · 3 of 156 slices shown]
[im 39/156  soft-tissue]
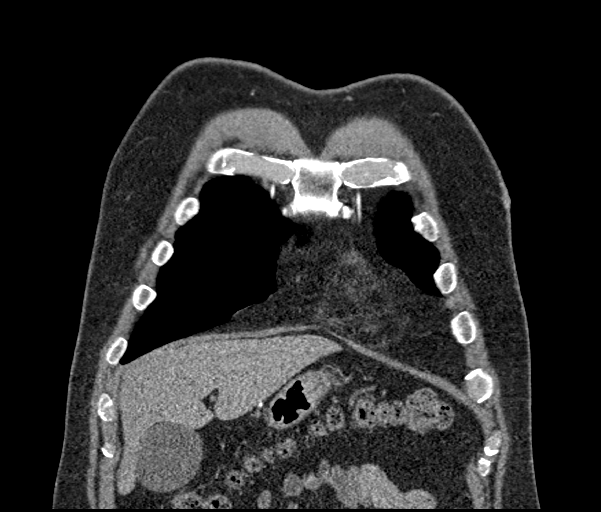
[im 78/156  soft-tissue]
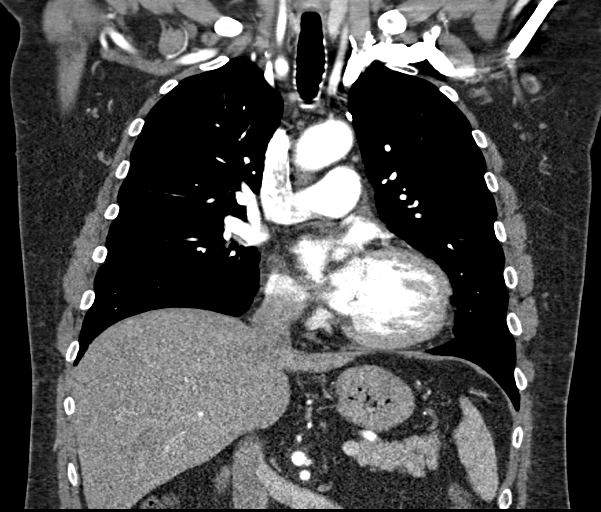
[im 117/156  soft-tissue]
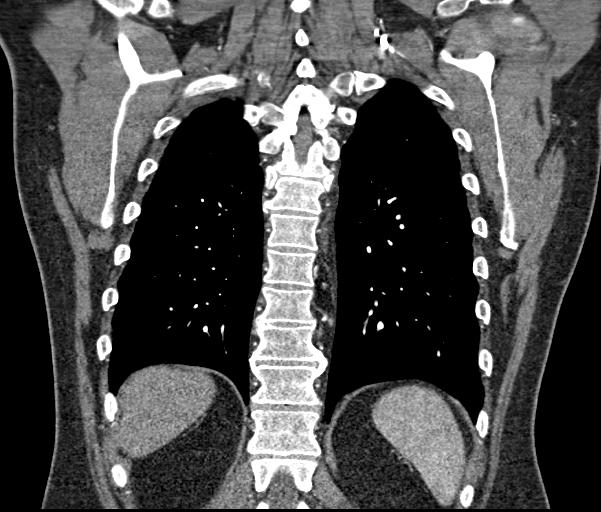

[18 of 46 positions shown; findings below may reference images not displayed]

FINDINGS: Cardiovascular: 4 vessel aortic arch. The left vertebral artery
arises directly from the aorta. The aorta is normal in caliber. No
dilation of the aortic root or Hoppe junction. The tubular
portion of the ascending thoracic aorta measures 3.7 cm. The aortic
arch and descending thoracic aorta are normal in caliber. The main
pulmonary artery is normal in caliber. No evidence of central
pulmonary embolus. Faint atherosclerotic vascular calcifications are
visualized along the left anterior descending coronary artery. The
heart is normal in size. Normal pulmonary venous drainage pattern.
No pericardial effusion.

Mediastinum/Nodes: No acute fracture or aggressive appearing lytic
or blastic osseous lesion.

Lungs/Pleura: The lungs are clear. Tiny 4 mm subpleural pulmonary
nodule/lymph node noted incidentally within the right major fissure.
This finding is almost certainly of no clinical significance and
warrants no follow-up. Similarly, a small 5 mm subpleural lymph node
is present within the left major fissure. Again, no follow-up
required.

Upper Abdomen: Incidentally noted simple cyst in hepatic segment 2.
Macroscopic fat containing 3.3 by 3.0 cm left adrenal mass most
consistent with a benign myelolipoma. No acute abnormality is
visualized within the upper abdomen.

Musculoskeletal: No acute fracture or aggressive appearing lytic or
blastic osseous lesion.

Review of the MIP images confirms the above findings.
IMPRESSION: 1. No evidence of thoracic aortic aneurysm. The thoracic aorta is
normal in caliber.
2. Coronary artery calcifications.
3. Four vessel aortic arch anatomy. The left vertebral artery arises
directly from the aorta.
4. Stable to minimally enlarged benign left adrenal myelolipoma.

## 2019-11-26 DIAGNOSIS — Z23 Encounter for immunization: Secondary | ICD-10-CM | POA: Diagnosis not present

## 2019-12-13 ENCOUNTER — Other Ambulatory Visit: Payer: Self-pay

## 2019-12-20 ENCOUNTER — Telehealth: Payer: Self-pay | Admitting: Cardiology

## 2019-12-20 NOTE — Telephone Encounter (Signed)
Patient is returning call.  °
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
## 2019-12-20 NOTE — Telephone Encounter (Signed)
LMTCB

## 2019-12-20 NOTE — Telephone Encounter (Signed)
Pt called to report that he recently noticed that for years he was on Losartan/ HCTZ but due to back order several months ago he was changed to Losartan only.   He is asking if this is what he should be taking or should he consider going back on the combo med if available. I asked him to check his BP over the next several days and to call us with the readings and we can then ave Dr. Agustin Cree review and determine if he needs the combo med.   Pt agreed and will call back next week with his BP readings.   Pt reports that he has been feeling well.

## 2020-01-12 DIAGNOSIS — J22 Unspecified acute lower respiratory infection: Secondary | ICD-10-CM | POA: Diagnosis not present

## 2020-01-26 DIAGNOSIS — R1032 Left lower quadrant pain: Secondary | ICD-10-CM | POA: Insufficient documentation

## 2020-01-26 HISTORY — DX: Left lower quadrant pain: R10.32

## 2020-01-27 DIAGNOSIS — R109 Unspecified abdominal pain: Secondary | ICD-10-CM | POA: Diagnosis not present

## 2020-01-27 DIAGNOSIS — R1032 Left lower quadrant pain: Secondary | ICD-10-CM | POA: Diagnosis not present

## 2020-01-29 HISTORY — PX: COLONOSCOPY: SHX174

## 2020-02-18 ENCOUNTER — Other Ambulatory Visit: Payer: Self-pay

## 2020-02-18 MED ORDER — NEBIVOLOL HCL 5 MG PO TABS
5.0000 mg | ORAL_TABLET | Freq: Every day | ORAL | 3 refills | Status: DC
Start: 2020-02-18 — End: 2020-10-03

## 2020-03-06 DIAGNOSIS — I10 Essential (primary) hypertension: Secondary | ICD-10-CM | POA: Diagnosis not present

## 2020-03-06 DIAGNOSIS — Z6831 Body mass index (BMI) 31.0-31.9, adult: Secondary | ICD-10-CM | POA: Diagnosis not present

## 2020-03-06 DIAGNOSIS — L739 Follicular disorder, unspecified: Secondary | ICD-10-CM | POA: Diagnosis not present

## 2020-03-09 DIAGNOSIS — L723 Sebaceous cyst: Secondary | ICD-10-CM | POA: Diagnosis not present

## 2020-03-09 DIAGNOSIS — L089 Local infection of the skin and subcutaneous tissue, unspecified: Secondary | ICD-10-CM | POA: Insufficient documentation

## 2020-03-21 DIAGNOSIS — M25552 Pain in left hip: Secondary | ICD-10-CM | POA: Diagnosis not present

## 2020-03-22 DIAGNOSIS — L723 Sebaceous cyst: Secondary | ICD-10-CM | POA: Diagnosis not present

## 2020-03-22 DIAGNOSIS — L720 Epidermal cyst: Secondary | ICD-10-CM | POA: Diagnosis not present

## 2020-05-15 DIAGNOSIS — Z0000 Encounter for general adult medical examination without abnormal findings: Secondary | ICD-10-CM | POA: Diagnosis not present

## 2020-05-15 DIAGNOSIS — R197 Diarrhea, unspecified: Secondary | ICD-10-CM | POA: Diagnosis not present

## 2020-05-15 DIAGNOSIS — Z6831 Body mass index (BMI) 31.0-31.9, adult: Secondary | ICD-10-CM | POA: Diagnosis not present

## 2020-05-15 DIAGNOSIS — Z1331 Encounter for screening for depression: Secondary | ICD-10-CM | POA: Diagnosis not present

## 2020-05-15 DIAGNOSIS — K219 Gastro-esophageal reflux disease without esophagitis: Secondary | ICD-10-CM | POA: Diagnosis not present

## 2020-06-02 DIAGNOSIS — M25552 Pain in left hip: Secondary | ICD-10-CM | POA: Diagnosis not present

## 2020-06-02 DIAGNOSIS — G5712 Meralgia paresthetica, left lower limb: Secondary | ICD-10-CM | POA: Diagnosis not present

## 2020-06-06 DIAGNOSIS — E7800 Pure hypercholesterolemia, unspecified: Secondary | ICD-10-CM | POA: Diagnosis not present

## 2020-06-06 DIAGNOSIS — I10 Essential (primary) hypertension: Secondary | ICD-10-CM | POA: Diagnosis not present

## 2020-06-06 DIAGNOSIS — G4700 Insomnia, unspecified: Secondary | ICD-10-CM | POA: Diagnosis not present

## 2020-06-06 DIAGNOSIS — Z79899 Other long term (current) drug therapy: Secondary | ICD-10-CM | POA: Diagnosis not present

## 2020-06-06 DIAGNOSIS — M109 Gout, unspecified: Secondary | ICD-10-CM | POA: Diagnosis not present

## 2020-06-06 DIAGNOSIS — Z6832 Body mass index (BMI) 32.0-32.9, adult: Secondary | ICD-10-CM | POA: Diagnosis not present

## 2020-06-07 DIAGNOSIS — K5900 Constipation, unspecified: Secondary | ICD-10-CM | POA: Diagnosis not present

## 2020-06-07 DIAGNOSIS — D126 Benign neoplasm of colon, unspecified: Secondary | ICD-10-CM | POA: Diagnosis not present

## 2020-06-12 DIAGNOSIS — R197 Diarrhea, unspecified: Secondary | ICD-10-CM | POA: Diagnosis not present

## 2020-06-13 DIAGNOSIS — R197 Diarrhea, unspecified: Secondary | ICD-10-CM | POA: Diagnosis not present

## 2020-06-13 DIAGNOSIS — K529 Noninfective gastroenteritis and colitis, unspecified: Secondary | ICD-10-CM | POA: Diagnosis not present

## 2020-06-14 DIAGNOSIS — F411 Generalized anxiety disorder: Secondary | ICD-10-CM | POA: Diagnosis not present

## 2020-06-14 DIAGNOSIS — K529 Noninfective gastroenteritis and colitis, unspecified: Secondary | ICD-10-CM | POA: Diagnosis not present

## 2020-06-14 DIAGNOSIS — F438 Other reactions to severe stress: Secondary | ICD-10-CM | POA: Diagnosis not present

## 2020-06-14 DIAGNOSIS — R109 Unspecified abdominal pain: Secondary | ICD-10-CM | POA: Diagnosis not present

## 2020-06-28 DIAGNOSIS — F411 Generalized anxiety disorder: Secondary | ICD-10-CM | POA: Diagnosis not present

## 2020-06-28 DIAGNOSIS — F438 Other reactions to severe stress: Secondary | ICD-10-CM | POA: Diagnosis not present

## 2020-07-10 DIAGNOSIS — Z6831 Body mass index (BMI) 31.0-31.9, adult: Secondary | ICD-10-CM | POA: Diagnosis not present

## 2020-07-10 DIAGNOSIS — R059 Cough, unspecified: Secondary | ICD-10-CM | POA: Diagnosis not present

## 2020-07-10 DIAGNOSIS — J209 Acute bronchitis, unspecified: Secondary | ICD-10-CM | POA: Diagnosis not present

## 2020-07-17 DIAGNOSIS — F411 Generalized anxiety disorder: Secondary | ICD-10-CM | POA: Diagnosis not present

## 2020-07-17 DIAGNOSIS — F438 Other reactions to severe stress: Secondary | ICD-10-CM | POA: Diagnosis not present

## 2020-07-20 DIAGNOSIS — K5900 Constipation, unspecified: Secondary | ICD-10-CM | POA: Diagnosis not present

## 2020-07-20 DIAGNOSIS — K219 Gastro-esophageal reflux disease without esophagitis: Secondary | ICD-10-CM | POA: Diagnosis not present

## 2020-07-28 DIAGNOSIS — K219 Gastro-esophageal reflux disease without esophagitis: Secondary | ICD-10-CM | POA: Diagnosis not present

## 2020-07-28 DIAGNOSIS — K591 Functional diarrhea: Secondary | ICD-10-CM | POA: Diagnosis not present

## 2020-07-28 DIAGNOSIS — K317 Polyp of stomach and duodenum: Secondary | ICD-10-CM | POA: Diagnosis not present

## 2020-07-28 DIAGNOSIS — I110 Hypertensive heart disease with heart failure: Secondary | ICD-10-CM | POA: Diagnosis not present

## 2020-07-28 DIAGNOSIS — Z86010 Personal history of colonic polyps: Secondary | ICD-10-CM | POA: Diagnosis not present

## 2020-07-28 DIAGNOSIS — K449 Diaphragmatic hernia without obstruction or gangrene: Secondary | ICD-10-CM | POA: Diagnosis not present

## 2020-07-28 DIAGNOSIS — K2970 Gastritis, unspecified, without bleeding: Secondary | ICD-10-CM | POA: Diagnosis not present

## 2020-07-28 DIAGNOSIS — K5730 Diverticulosis of large intestine without perforation or abscess without bleeding: Secondary | ICD-10-CM | POA: Diagnosis not present

## 2020-07-28 DIAGNOSIS — D131 Benign neoplasm of stomach: Secondary | ICD-10-CM | POA: Diagnosis not present

## 2020-07-28 LAB — HM COLONOSCOPY

## 2020-08-03 DIAGNOSIS — J209 Acute bronchitis, unspecified: Secondary | ICD-10-CM | POA: Diagnosis not present

## 2020-08-03 DIAGNOSIS — K219 Gastro-esophageal reflux disease without esophagitis: Secondary | ICD-10-CM | POA: Diagnosis not present

## 2020-08-21 DIAGNOSIS — Z6831 Body mass index (BMI) 31.0-31.9, adult: Secondary | ICD-10-CM | POA: Diagnosis not present

## 2020-08-21 DIAGNOSIS — R053 Chronic cough: Secondary | ICD-10-CM | POA: Diagnosis not present

## 2020-08-21 DIAGNOSIS — Z20828 Contact with and (suspected) exposure to other viral communicable diseases: Secondary | ICD-10-CM | POA: Diagnosis not present

## 2020-08-21 DIAGNOSIS — I10 Essential (primary) hypertension: Secondary | ICD-10-CM | POA: Diagnosis not present

## 2020-08-22 DIAGNOSIS — R059 Cough, unspecified: Secondary | ICD-10-CM | POA: Diagnosis not present

## 2020-08-25 DIAGNOSIS — R0602 Shortness of breath: Secondary | ICD-10-CM | POA: Diagnosis not present

## 2020-08-25 DIAGNOSIS — R053 Chronic cough: Secondary | ICD-10-CM | POA: Diagnosis not present

## 2020-08-25 DIAGNOSIS — K219 Gastro-esophageal reflux disease without esophagitis: Secondary | ICD-10-CM | POA: Diagnosis not present

## 2020-09-04 DIAGNOSIS — H348312 Tributary (branch) retinal vein occlusion, right eye, stable: Secondary | ICD-10-CM | POA: Diagnosis not present

## 2020-09-04 DIAGNOSIS — H3581 Retinal edema: Secondary | ICD-10-CM | POA: Diagnosis not present

## 2020-09-04 DIAGNOSIS — Z961 Presence of intraocular lens: Secondary | ICD-10-CM | POA: Diagnosis not present

## 2020-09-04 DIAGNOSIS — H439 Unspecified disorder of vitreous body: Secondary | ICD-10-CM | POA: Diagnosis not present

## 2020-09-04 DIAGNOSIS — Z9842 Cataract extraction status, left eye: Secondary | ICD-10-CM | POA: Diagnosis not present

## 2020-09-04 DIAGNOSIS — H209 Unspecified iridocyclitis: Secondary | ICD-10-CM | POA: Diagnosis not present

## 2020-09-06 DIAGNOSIS — R198 Other specified symptoms and signs involving the digestive system and abdomen: Secondary | ICD-10-CM | POA: Diagnosis not present

## 2020-09-06 DIAGNOSIS — K317 Polyp of stomach and duodenum: Secondary | ICD-10-CM | POA: Diagnosis not present

## 2020-09-06 DIAGNOSIS — R059 Cough, unspecified: Secondary | ICD-10-CM | POA: Diagnosis not present

## 2020-09-06 DIAGNOSIS — K219 Gastro-esophageal reflux disease without esophagitis: Secondary | ICD-10-CM | POA: Diagnosis not present

## 2020-09-08 DIAGNOSIS — K219 Gastro-esophageal reflux disease without esophagitis: Secondary | ICD-10-CM | POA: Diagnosis not present

## 2020-09-08 DIAGNOSIS — R053 Chronic cough: Secondary | ICD-10-CM | POA: Diagnosis not present

## 2020-09-14 ENCOUNTER — Encounter: Payer: Self-pay | Admitting: Family Medicine

## 2020-09-14 ENCOUNTER — Ambulatory Visit (INDEPENDENT_AMBULATORY_CARE_PROVIDER_SITE_OTHER): Payer: Medicare Other | Admitting: Family Medicine

## 2020-09-14 ENCOUNTER — Other Ambulatory Visit: Payer: Self-pay

## 2020-09-14 VITALS — BP 118/78 | HR 69 | Temp 95.8°F | Ht 71.0 in | Wt 225.0 lb

## 2020-09-14 DIAGNOSIS — M1A9XX0 Chronic gout, unspecified, without tophus (tophi): Secondary | ICD-10-CM | POA: Diagnosis not present

## 2020-09-14 DIAGNOSIS — I700 Atherosclerosis of aorta: Secondary | ICD-10-CM | POA: Diagnosis not present

## 2020-09-14 DIAGNOSIS — F5101 Primary insomnia: Secondary | ICD-10-CM | POA: Diagnosis not present

## 2020-09-14 DIAGNOSIS — I10 Essential (primary) hypertension: Secondary | ICD-10-CM

## 2020-09-14 DIAGNOSIS — K219 Gastro-esophageal reflux disease without esophagitis: Secondary | ICD-10-CM

## 2020-09-14 MED ORDER — ZOLPIDEM TARTRATE 10 MG PO TABS: 10.0000 mg | ORAL_TABLET | Freq: Every evening | ORAL | 2 refills | Status: DC | PRN
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
## 2020-09-14 NOTE — Patient Instructions (Signed)
AORTIC ATHEROSCLEROSIS: Start on pravastatin 10 mg once daily.   Hypertension: Finish bystolic and then stop it.  Check bp at home daily once you stop it. Call if it increases above 140/90.   GERD induced cough Continue omeprazole  Insomnia: ambien daily as needed.

## 2020-10-01 ENCOUNTER — Encounter: Payer: Self-pay | Admitting: Family Medicine

## 2020-10-03 ENCOUNTER — Telehealth: Payer: Self-pay | Admitting: Cardiology

## 2020-10-03 NOTE — Telephone Encounter (Signed)
New message:      Patient says he would like for Dr Agustin Cree or his nurse to call him please. He says he wants to discuss his medicine with one of them.

## 2020-10-03 NOTE — Telephone Encounter (Signed)
Spoke to the patient just now and he was wondering if he should be taking metoprolol, or losartan. I let him know that these medications were not on his medication list at this time. He tells me that he thought they were discontinued and I told him that this was correct. We went over his medication list together and everything should be correct at this time.

## 2020-10-04 DIAGNOSIS — N5201 Erectile dysfunction due to arterial insufficiency: Secondary | ICD-10-CM | POA: Diagnosis not present

## 2020-10-18 DIAGNOSIS — M5136 Other intervertebral disc degeneration, lumbar region: Secondary | ICD-10-CM | POA: Diagnosis not present

## 2020-10-18 DIAGNOSIS — H348312 Tributary (branch) retinal vein occlusion, right eye, stable: Secondary | ICD-10-CM | POA: Insufficient documentation

## 2020-10-18 DIAGNOSIS — M5416 Radiculopathy, lumbar region: Secondary | ICD-10-CM | POA: Diagnosis not present

## 2020-10-20 ENCOUNTER — Encounter: Payer: Self-pay | Admitting: Cardiology

## 2020-10-20 ENCOUNTER — Other Ambulatory Visit: Payer: Self-pay | Admitting: Cardiology

## 2020-10-20 ENCOUNTER — Other Ambulatory Visit: Payer: Self-pay

## 2020-10-20 ENCOUNTER — Ambulatory Visit (INDEPENDENT_AMBULATORY_CARE_PROVIDER_SITE_OTHER): Payer: Medicare Other | Admitting: Cardiology

## 2020-10-20 VITALS — BP 110/76 | HR 56 | Ht 72.0 in | Wt 228.4 lb

## 2020-10-20 DIAGNOSIS — I7121 Aneurysm of the ascending aorta, without rupture: Secondary | ICD-10-CM

## 2020-10-20 DIAGNOSIS — R002 Palpitations: Secondary | ICD-10-CM | POA: Diagnosis not present

## 2020-10-20 DIAGNOSIS — R0789 Other chest pain: Secondary | ICD-10-CM | POA: Diagnosis not present

## 2020-10-20 DIAGNOSIS — I712 Thoracic aortic aneurysm, without rupture: Secondary | ICD-10-CM

## 2020-10-20 DIAGNOSIS — I10 Essential (primary) hypertension: Secondary | ICD-10-CM

## 2020-10-20 NOTE — Patient Instructions (Signed)
Medication Instructions:  No medication changes. *If you need a refill on your cardiac medications before your next appointment, please call your pharmacy*   Lab Work: None ordered If you have labs (blood work) drawn today and your tests are completely normal, you will receive your results only by: MyChart Message (if you have MyChart) OR A paper copy in the mail If you have any lab test that is abnormal or we need to change your treatment, we will call you to review the results.   Testing/Procedures: Your physician has requested that you have an echocardiogram. Echocardiography is a painless test that uses sound waves to create images of your heart. It provides your doctor with information about the size and shape of your heart and how well your heart's chambers and valves are working. This procedure takes approximately one hour. There are no restrictions for this procedure.    Follow-Up: At [facility], you and your health needs are our priority.  As part of our continuing mission to provide you with exceptional heart care, we have created designated Provider Care Teams.  These Care Teams include your primary Cardiologist (physician) and Advanced Practice Providers (APPs -  Physician Assistants and Nurse Practitioners) who all work together to provide you with the care you need, when you need it.  We recommend signing up for the patient portal called "MyChart".  Sign up information is provided on this After Visit Summary.  MyChart is used to connect with patients for Virtual Visits (Telemedicine).  Patients are able to view lab/test results, encounter notes, upcoming appointments, etc.  Non-urgent messages can be sent to your provider as well.   To learn more about what you can do with MyChart, go to [email or web address].    Your next appointment:   6 month(s)  The format for your next appointment:   In Person  Provider:   Rajan Revankar, MD   Other  Instructions Echocardiogram An echocardiogram is a test that uses sound waves (ultrasound) to produce images of the heart. Images from an echocardiogram can provide important information about: Heart size and shape. The size and thickness and movement of your heart's walls. Heart muscle function and strength. Heart valve function or if you have stenosis. Stenosis is when the heart valves are too narrow. If blood is flowing backward through the heart valves (regurgitation). A tumor or infectious growth around the heart valves. Areas of heart muscle that are not working well because of poor blood flow or injury from a heart attack. Aneurysm detection. An aneurysm is a weak or damaged part of an artery wall. The wall bulges out from the normal force of blood pumping through the body. Tell a health care provider about: Any allergies you have. All medicines you are taking, including vitamins, herbs, eye drops, creams, and over-the-counter medicines. Any blood disorders you have. Any surgeries you have had. Any medical conditions you have. Whether you are pregnant or may be pregnant. What are the risks? Generally, this is a safe test. However, problems may occur, including an allergic reaction to dye (contrast) that may be used during the test. What happens before the test? No specific preparation is needed. You may eat and drink normally. What happens during the test? You will take off your clothes from the waist up and put on a hospital gown. Electrodes or electrocardiogram (ECG)patches may be placed on your chest. The electrodes or patches are then connected to a device that monitors your heart rate and rhythm. You will   lie down on a table for an ultrasound exam. A gel will be applied to your chest to help sound waves pass through your skin. A handheld device, called a transducer, will be pressed against your chest and moved over your heart. The transducer produces sound waves that travel to  your heart and bounce back (or "echo" back) to the transducer. These sound waves will be captured in real-time and changed into images of your heart that can be viewed on a video monitor. The images will be recorded on a computer and reviewed by your health care provider. You may be asked to change positions or hold your breath for a short time. This makes it easier to get different views or better views of your heart. In some cases, you may receive contrast through an IV in one of your veins. This can improve the quality of the pictures from your heart. The procedure may vary among health care providers and hospitals.   What can I expect after the test? You may return to your normal, everyday life, including diet, activities, and medicines, unless your health care provider tells you not to do that. Follow these instructions at home: It is up to you to get the results of your test. Ask your health care provider, or the department that is doing the test, when your results will be ready. Keep all follow-up visits. This is important. Summary An echocardiogram is a test that uses sound waves (ultrasound) to produce images of the heart. Images from an echocardiogram can provide important information about the size and shape of your heart, heart muscle function, heart valve function, and other possible heart problems. You do not need to do anything to prepare before this test. You may eat and drink normally. After the echocardiogram is completed, you may return to your normal, everyday life, unless your health care provider tells you not to do that. This information is not intended to replace advice given to you by your health care provider. Make sure you discuss any questions you have with your health care provider. Document Revised: 09/07/2019 Document Reviewed: 09/07/2019 Elsevier Patient Education  2021 Elsevier Inc.   

## 2020-10-20 NOTE — Progress Notes (Signed)
Cardiology Office Note:    Date:  10/20/2020   ID:  Clarence Hill, DOB 11-19-1945, MRN [id number]  PCP:  [name], MD  Cardiologist:  Jenne Campus, MD    Referring MD: [name], MD   Chief Complaint  Patient presents with   Medication Management    History of Present Illness:    Clarence Hill is a 75 y.o. male with past medical history significant for palpitations, PVCs, no dizziness no syncope, atypical chest pain, enlargement of the ascending aorta that being called as aneurysm but actually more appropriately should be described as mild dilatation of the ascending aorta.  He comes today to my office for follow-up overall he is doing well.  Denies have any chest pain tightness squeezing pressure mid chest.  He does have some questions about blood pressure and blood pressure medications.  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   amLODipine (NORVASC) 10 MG tablet Take 10 mg by mouth  daily.   aspirin EC 81 MG tablet Take 81 mg by mouth daily.   colchicine 0.6 MG tablet TAKE 1 TABLET (0.6 MG TOTAL) BY MOUTH DAILY. AS NEEDED DURING GOUT FLARE   Multiple Vitamins-Minerals (MULTIVITAMIN) LIQD Take 1 tablet by mouth daily. Unknown strength   omeprazole (PRILOSEC) 20 MG capsule Take 20 mg by mouth daily. Take 2 capsule daily   pravastatin (PRAVACHOL) 10 MG tablet Take 10 mg by mouth daily.   vitamin B-12 (CYANOCOBALAMIN) 1000 MCG tablet Take 2,500 mcg by mouth daily.   zolpidem (AMBIEN) 10 MG tablet Take 1 tablet (10 mg total) by mouth at bedtime as needed. (Patient taking differently: Take 10 mg by mouth at bedtime as needed for sleep.)     Allergies:   Levofloxacin, Pepcid [famotidine], Penicillin g, and Penicillin g benzathine & proc   Social History   Socioeconomic History   Marital status: Married    Spouse name: Not on file   Number of children: 2   Years of education: Not on file   Highest education level: Not on file  Occupational History   Not on file  Tobacco Use   Smoking status: Former   Smokeless tobacco: Never  Vaping Use   Vaping Use: Never used  Substance and Sexual Activity   Alcohol use: Yes    Alcohol/week: 5.0 standard drinks    Types: 5 Glasses of wine per week   Drug use: Never   Sexual activity: Not on  file  Other Topics Concern   Not on file  Social History Narrative   Not on file   Social Determinants of Health   Financial Resource Strain: Not on file  Food Insecurity: Not on file  Transportation Needs: Not on file  Physical Activity: Not on file  Stress: Not on file  Social Connections: Not on file     Family History: The patient's family history includes Breast cancer in his sister; CAD in his father and paternal uncle; Renal cancer in his sister; Thyroid cancer in his mother. ROS:   Please see the history of present illness.    All 14 point review of systems negative except as described per history of present  illness  EKGs/Labs/Other Studies Reviewed:      Recent Labs: No results found for requested labs within last 8760 hours.  Recent Lipid Panel No results found for: CHOL, TRIG, HDL, CHOLHDL, VLDL, LDLCALC, LDLDIRECT  Physical Exam:    VS:  BP 110/76 (BP Location: Left Arm, Patient Position: Sitting)   Pulse (!) 56   Ht 6' (1.829 m)   Wt 228 lb 6.4 oz (103.6 kg)   SpO2 96%   BMI 30.98 kg/m     Wt Readings from Last 3 Encounters:  10/20/20 228 lb 6.4 oz (103.6 kg)  09/14/20 225 lb (102.1 kg)  11/18/19 228 lb (103.4 kg)     GEN:  Well nourished, well developed in no acute distress HEENT: Normal NECK: No JVD; No carotid bruits LYMPHATICS: No lymphadenopathy CARDIAC: RRR, no murmurs, no rubs, no gallops RESPIRATORY:  Clear to auscultation without rales, wheezing or rhonchi  ABDOMEN: Soft, non-tender, non-distended MUSCULOSKELETAL:  No edema; No deformity  SKIN: Warm and dry LOWER EXTREMITIES: no swelling NEUROLOGIC:  Alert and oriented x 3 PSYCHIATRIC:  Normal affect   ASSESSMENT:    1. Ascending aortic aneurysm ([place])   2. Benign essential hypertension   3. Atypical chest pain   4. Palpitations    PLAN:    In order of problems listed above:  Palpitations: Under control continue present management. Mild dilatation of the ascending aorta within largest diameter of 40 mmHg.  I will repeat his echocardiogram to recheck the size of the aorta and also do this because his EKG showed 4 hours progression anterior precordium raising suspicion for anterior septal wall MI however he does not have any symptomatology that was fit to do his EKG. Atypical chest pain denies having any Essential hypertension controlled Dyslipidemia his LDL is 55 he is on very small dose of statin which is only 10 mg of pravastatin which I will continue.  This is for overall risk factors modifications.   Medication Adjustments/Labs and Tests Ordered: Current medicines are reviewed at length with the  patient today.  Concerns regarding medicines are outlined above.  Orders Placed This Encounter  Procedures   EKG 12-Lead   ECHOCARDIOGRAM COMPLETE   Medication changes: No orders of the defined types were placed in this encounter.   Signed, Park Liter, MD, [facility] 10/20/2020 [place]

## 2020-10-27 DIAGNOSIS — M5127 Other intervertebral disc displacement, lumbosacral region: Secondary | ICD-10-CM | POA: Diagnosis not present

## 2020-10-27 DIAGNOSIS — M5416 Radiculopathy, lumbar region: Secondary | ICD-10-CM | POA: Diagnosis not present

## 2020-10-27 DIAGNOSIS — M48061 Spinal stenosis, lumbar region without neurogenic claudication: Secondary | ICD-10-CM | POA: Diagnosis not present

## 2020-11-02 DIAGNOSIS — M5416 Radiculopathy, lumbar region: Secondary | ICD-10-CM | POA: Diagnosis not present

## 2020-11-13 ENCOUNTER — Ambulatory Visit (INDEPENDENT_AMBULATORY_CARE_PROVIDER_SITE_OTHER): Payer: Medicare Other

## 2020-11-13 ENCOUNTER — Other Ambulatory Visit: Payer: Self-pay

## 2020-11-13 DIAGNOSIS — I7121 Aneurysm of the ascending aorta, without rupture: Secondary | ICD-10-CM

## 2020-11-13 LAB — ECHOCARDIOGRAM COMPLETE
Area-P 1/2: 3.24 cm2
P 1/2 time: 832 msec
S' Lateral: 3.2 cm

## 2020-11-16 ENCOUNTER — Other Ambulatory Visit: Payer: Self-pay

## 2020-11-16 DIAGNOSIS — M1A9XX0 Chronic gout, unspecified, without tophus (tophi): Secondary | ICD-10-CM

## 2020-11-16 MED ORDER — COLCHICINE 0.6 MG PO TABS: 0.6000 mg | ORAL_TABLET | Freq: Every day | ORAL | 0 refills | Status: DC

## 2020-11-16 NOTE — Progress Notes (Signed)
Patient is going out of town and requested refill incase he needs it while he is gone.  #[phone] sent to [facility] per patient request.

## 2020-11-17 ENCOUNTER — Telehealth: Payer: Self-pay | Admitting: Cardiology

## 2020-11-17 ENCOUNTER — Ambulatory Visit (INDEPENDENT_AMBULATORY_CARE_PROVIDER_SITE_OTHER): Payer: Medicare Other

## 2020-11-17 DIAGNOSIS — Z23 Encounter for immunization: Secondary | ICD-10-CM | POA: Diagnosis not present

## 2020-11-17 NOTE — Telephone Encounter (Signed)
Patient returned call for test results.  °

## 2020-11-17 NOTE — Telephone Encounter (Signed)
Patient informed of results.  

## 2020-11-27 DIAGNOSIS — Z6837 Body mass index (BMI) 37.0-37.9, adult: Secondary | ICD-10-CM | POA: Insufficient documentation

## 2020-11-27 DIAGNOSIS — M5416 Radiculopathy, lumbar region: Secondary | ICD-10-CM | POA: Diagnosis not present

## 2020-11-27 DIAGNOSIS — Z6831 Body mass index (BMI) 31.0-31.9, adult: Secondary | ICD-10-CM | POA: Insufficient documentation

## 2020-11-27 DIAGNOSIS — I10 Essential (primary) hypertension: Secondary | ICD-10-CM | POA: Diagnosis not present

## 2020-11-27 HISTORY — DX: Body mass index (BMI) 31.0-31.9, adult: Z68.31

## 2020-11-28 ENCOUNTER — Other Ambulatory Visit: Payer: Self-pay | Admitting: Neurosurgery

## 2020-11-28 DIAGNOSIS — M5416 Radiculopathy, lumbar region: Secondary | ICD-10-CM

## 2020-12-06 ENCOUNTER — Ambulatory Visit
Admission: RE | Admit: 2020-12-06 | Discharge: 2020-12-06 | Disposition: A | Discharge status: Home / Self Care | Payer: Medicare Other | Source: Ambulatory Visit | Attending: Neurosurgery | Admitting: Neurosurgery

## 2020-12-06 DIAGNOSIS — M5126 Other intervertebral disc displacement, lumbar region: Secondary | ICD-10-CM | POA: Diagnosis not present

## 2020-12-06 DIAGNOSIS — M47817 Spondylosis without myelopathy or radiculopathy, lumbosacral region: Secondary | ICD-10-CM | POA: Diagnosis not present

## 2020-12-06 DIAGNOSIS — M5416 Radiculopathy, lumbar region: Secondary | ICD-10-CM

## 2020-12-06 MED ORDER — IOPAMIDOL (ISOVUE-M 200) INJECTION 41%
1.0000 mL | Freq: Once | INTRAMUSCULAR | Status: AC
  Administered 2020-12-06: 1 mL via EPIDURAL

## 2020-12-06 MED ORDER — METHYLPREDNISOLONE ACETATE 40 MG/ML INJ SUSP (RADIOLOG
80.0000 mg | Freq: Once | INTRAMUSCULAR | Status: AC
  Administered 2020-12-06: 80 mg via EPIDURAL

## 2020-12-06 NOTE — Discharge Instructions (Addendum)
Post Procedure Spinal Discharge Instruction Sheet  You may resume a regular diet and any medications that you routinely take (including pain medications) unless otherwise noted by MD.  No driving day of procedure.  Light activity throughout the rest of the day.  Do not do any strenuous work, exercise, bending or lifting.  The day following the procedure, you can resume normal physical activity but you should refrain from exercising or physical therapy for at least three days thereafter.  You may apply ice to the injection site, 20 minutes on, 20 minutes off, as needed. Do not apply ice directly to skin.    Common Side Effects:  Headaches- take your usual medications as directed by your physician.  Increase your fluid intake.  Caffeinated beverages may be helpful.  Lie flat in bed until your headache resolves.  Restlessness or inability to sleep- you may have trouble sleeping for the next few days.  Ask your referring physician if you need any medication for sleep.  Facial flushing or redness- should subside within a few days.  Increased pain- a temporary increase in pain a day or two following your procedure is not unusual.  Take your pain medication as prescribed by your referring physician.  Leg cramps  Please contact our office at [phone] for the following symptoms: Fever greater than 100 degrees. Headaches unresolved with medication after 2-3 days. Increased swelling, pain, or redness at injection site.   Thank you for visiting [facility] today.    YOU MAY RESUME YOUR ASPIRIN TODAY AFTER PROCEDURE.

## 2020-12-18 ENCOUNTER — Ambulatory Visit: Payer: Medicare Other | Admitting: Family Medicine

## 2020-12-26 ENCOUNTER — Telehealth: Payer: Self-pay | Admitting: Cardiology

## 2020-12-26 NOTE — Telephone Encounter (Signed)
   Pt said, he's skipping heart beat is getting worst, he said it skips every 10 heart beat now, he wanted to know if he needs to see Dr. Raliegh Ip or tests done

## 2020-12-26 NOTE — Telephone Encounter (Signed)
Spoke to the patient just now and he let me know that he feels like his heart is skipping. He tells me that he feels like it is every 10th beat and it is getting worse. In the past this would happen 3-4 times a day but now it is all of the time. He is wondering what Dr. Wendy Poet recommendations are at this time. No other symptoms at this time.

## 2020-12-27 NOTE — Telephone Encounter (Signed)
Called patient and went over monitor and getting it sent to him. He reports since he didn't get a answer yesterday he contacted his old cardiologist. He has a appointment with them now and would like to hear their opinion as well. If they advise a monitor also he will get [name] to do it he says. Advised [name] to let him know if he needs anything further.

## 2020-12-28 DIAGNOSIS — S99922A Unspecified injury of left foot, initial encounter: Secondary | ICD-10-CM | POA: Diagnosis not present
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
## 2021-01-01 ENCOUNTER — Encounter: Payer: Self-pay | Admitting: Family Medicine

## 2021-01-01 ENCOUNTER — Other Ambulatory Visit: Payer: Self-pay | Admitting: Family Medicine

## 2021-01-01 ENCOUNTER — Ambulatory Visit (INDEPENDENT_AMBULATORY_CARE_PROVIDER_SITE_OTHER): Payer: Medicare Other | Admitting: Family Medicine

## 2021-01-01 ENCOUNTER — Telehealth: Payer: Self-pay

## 2021-01-01 ENCOUNTER — Other Ambulatory Visit: Payer: Self-pay

## 2021-01-01 VITALS — BP 132/72 | HR 76 | Temp 97.3°F | Resp 16 | Ht 72.0 in | Wt 226.0 lb

## 2021-01-01 DIAGNOSIS — I498 Other specified cardiac arrhythmias: Secondary | ICD-10-CM | POA: Diagnosis not present

## 2021-01-01 DIAGNOSIS — I499 Cardiac arrhythmia, unspecified: Secondary | ICD-10-CM

## 2021-01-01 DIAGNOSIS — I10 Essential (primary) hypertension: Secondary | ICD-10-CM | POA: Diagnosis not present

## 2021-01-01 DIAGNOSIS — R052 Subacute cough: Secondary | ICD-10-CM | POA: Insufficient documentation

## 2021-01-01 DIAGNOSIS — K219 Gastro-esophageal reflux disease without esophagitis: Secondary | ICD-10-CM | POA: Diagnosis not present

## 2021-01-01 DIAGNOSIS — S9032XA Contusion of left foot, initial encounter: Secondary | ICD-10-CM | POA: Diagnosis not present

## 2021-01-01 DIAGNOSIS — M1A9XX0 Chronic gout, unspecified, without tophus (tophi): Secondary | ICD-10-CM

## 2021-01-01 DIAGNOSIS — I700 Atherosclerosis of aorta: Secondary | ICD-10-CM | POA: Diagnosis not present

## 2021-01-01 DIAGNOSIS — F5101 Primary insomnia: Secondary | ICD-10-CM

## 2021-01-01 HISTORY — DX: Atherosclerosis of aorta: I70.0

## 2021-01-01 HISTORY — DX: Cardiac arrhythmia, unspecified: I49.9

## 2021-01-01 HISTORY — DX: Chronic gout, unspecified, without tophus (tophi): M1A.9XX0

## 2021-01-01 HISTORY — DX: Primary insomnia: F51.01

## 2021-01-01 MED ORDER — GUAIFENESIN-CODEINE 100-10 MG/5ML PO SOLN: 5.0000 mL | Freq: Three times a day (TID) | ORAL | 0 refills | Status: DC | PRN

## 2021-01-01 NOTE — Assessment & Plan Note (Signed)
Continue pravastatin and aspirin.

## 2021-01-01 NOTE — Assessment & Plan Note (Signed)
Improving.  rx Guaifenesin-codeine syrup.

## 2021-01-01 NOTE — Assessment & Plan Note (Signed)
The current medical regimen is effective;  continue present plan and medications.  

## 2021-01-01 NOTE — Telephone Encounter (Signed)
Patient calling requesting guaifenesin-codeine 100-10 5 mL.   Clarence Hill 01/01/21 2:36 PM

## 2021-01-01 NOTE — Assessment & Plan Note (Signed)
Well controlled.  ?No changes to medicines.  ?Continue to work on eating a healthy diet and exercise.  ?Labs drawn today.  ?

## 2021-01-01 NOTE — Assessment & Plan Note (Signed)
Monitor. Continue ice and elevation

## 2021-01-01 NOTE — Assessment & Plan Note (Signed)
Continue ambien

## 2021-01-02 LAB — COMPREHENSIVE METABOLIC PANEL
ALT: 23 IU/L (ref 0–44)
AST: 27 IU/L (ref 0–40)
Albumin/Globulin Ratio: 1.7 (ref 1.2–2.2)
Albumin: 4.5 g/dL (ref 3.7–4.7)
Alkaline Phosphatase: 76 IU/L (ref 44–121)
BUN/Creatinine Ratio: 12 (ref 10–24)
BUN: 15 mg/dL (ref 8–27)
Bilirubin Total: 0.7 mg/dL (ref 0.0–1.2)
CO2: 24 mmol/L (ref 20–29)
Calcium: 9.4 mg/dL (ref 8.6–10.2)
Chloride: 104 mmol/L (ref 96–106)
Creatinine, Ser: 1.24 mg/dL (ref 0.76–1.27)
Globulin, Total: 2.6 g/dL (ref 1.5–4.5)
Glucose: 95 mg/dL (ref 70–99)
Potassium: 4.8 mmol/L (ref 3.5–5.2)
Sodium: 140 mmol/L (ref 134–144)
Total Protein: 7.1 g/dL (ref 6.0–8.5)
eGFR: 61 mL/min/{1.73_m2} (ref >59)

## 2021-01-02 LAB — CBC WITH DIFFERENTIAL/PLATELET
Basophils Absolute: 0 10*3/uL (ref 0.0–0.2)
Basos: 1 %
EOS (ABSOLUTE): 0.1 10*3/uL (ref 0.0–0.4)
Eos: 3 %
Hematocrit: 43.5 % (ref 37.5–51.0)
Hemoglobin: 14.9 g/dL (ref 13.0–17.7)
Immature Grans (Abs): 0 10*3/uL (ref 0.0–0.1)
Immature Granulocytes: 0 %
Lymphocytes Absolute: 1.3 10*3/uL (ref 0.7–3.1)
Lymphs: 31 %
MCH: 30.7 pg (ref 26.6–33.0)
MCHC: 34.3 g/dL (ref 31.5–35.7)
MCV: 90 fL (ref 79–97)
Monocytes Absolute: 0.4 10*3/uL (ref 0.1–0.9)
Monocytes: 10 %
Neutrophils Absolute: 2.3 10*3/uL (ref 1.4–7.0)
Neutrophils: 55 %
Platelets: 303 10*3/uL (ref 150–450)
RBC: 4.85 x10E6/uL (ref 4.14–5.80)
RDW: 11.9 % (ref 11.6–15.4)
WBC: 4.2 10*3/uL (ref 3.4–10.8)

## 2021-01-02 LAB — LIPID PANEL
Chol/HDL Ratio: 2.9 ratio (ref 0.0–5.0)
Cholesterol, Total: 106 mg/dL (ref 100–199)
HDL: 36 mg/dL — ABNORMAL LOW (ref >39)
LDL Chol Calc (NIH): 49 mg/dL (ref 0–99)
Triglycerides: 115 mg/dL (ref 0–149)
VLDL Cholesterol Cal: 21 mg/dL (ref 5–40)

## 2021-01-02 LAB — TSH: TSH: 1.38 u[IU]/mL (ref 0.450–4.500)

## 2021-01-02 LAB — CARDIOVASCULAR RISK ASSESSMENT

## 2021-01-07 ENCOUNTER — Other Ambulatory Visit: Payer: Self-pay | Admitting: Family Medicine

## 2021-01-09 ENCOUNTER — Ambulatory Visit (INDEPENDENT_AMBULATORY_CARE_PROVIDER_SITE_OTHER): Payer: Medicare Other | Admitting: Nurse Practitioner

## 2021-01-09 ENCOUNTER — Encounter: Payer: Self-pay | Admitting: Nurse Practitioner

## 2021-01-09 VITALS — BP 118/80 | HR 75 | Temp 97.1°F | Ht 72.0 in | Wt 228.0 lb

## 2021-01-09 DIAGNOSIS — R051 Acute cough: Secondary | ICD-10-CM | POA: Diagnosis not present

## 2021-01-09 DIAGNOSIS — J069 Acute upper respiratory infection, unspecified: Secondary | ICD-10-CM | POA: Diagnosis not present

## 2021-01-09 LAB — POC COVID19 BINAXNOW: SARS Coronavirus 2 Ag: NEGATIVE

## 2021-01-09 LAB — POCT INFLUENZA A/B
Influenza A, POC: NEGATIVE
Influenza B, POC: NEGATIVE

## 2021-01-09 MED ORDER — FLUTICASONE PROPIONATE 50 MCG/ACT NA SUSP: 2.0000 | Freq: Every day | NASAL | 6 refills | Status: DC

## 2021-01-09 NOTE — Progress Notes (Signed)
Acute Office Visit  Subjective:    Patient ID: Clarence Hill, male    DOB: 1945-04-07, 75 y.o.   MRN: [id number]  Chief Complaint  Patient presents with   URI    HPI: Patient is in today for Upper respiratory symptoms He complains of congestion and nasal congestion.with no fever, chills, night sweats or weight loss. Onset of symptoms was 7 days ago and staying constant.He is drinking plenty of fluids.  Past history is significant for no history of pneumonia or bronchitis. 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   Left lower quadrant pain 01/26/2020   Nodule of finger of left hand 01/14/2018   Palpitations 05/12/2017   Proud flesh 09/15/2019   Retinal edema 02/09/2019   S/P cataract extraction and insertion of intraocular lens, left 02/09/2019   Thrombosed external hemorrhoid 12/18/2017   Vitreous debris 02/09/2019    Past Surgical History:  Procedure Laterality Date   APPENDECTOMY     CARDIAC CATHETERIZATION     CATARACT EXTRACTION     EYE SURGERY Right    Laser    Family History  Problem Relation Age of Onset   Thyroid cancer Mother    CAD Father    Breast cancer Sister    Renal cancer Sister    CAD Paternal Uncle     Social History   Socioeconomic History   Marital status: Married    Spouse name: Not on file   Number of children: 2   Years of education: Not on file   Highest education level: Not on file  Occupational History   Not on file  Tobacco Use   Smoking status: Former   Smokeless tobacco: Never  Vaping  Use   Vaping Use: Never used  Substance and Sexual Activity   Alcohol use: Yes    Alcohol/week: 5.0 standard drinks    Types: 5 Glasses of wine per week   Drug use: Never   Sexual activity: Not on file  Other Topics Concern   Not on file  Social History Narrative   Not on file   Social Determinants of Health   Financial Resource Strain: Not on file  Food Insecurity: Not on file  Transportation Needs: Not on file  Physical Activity: Not on file  Stress: Not on file  Social Connections: Not on file  Intimate Partner Violence: Not on file    Outpatient Medications Prior to Visit  Medication Sig Dispense Refill   allopurinol (ZYLOPRIM) 100 MG tablet Take 100 mg by mouth daily.  1   amLODipine (NORVASC) 10 MG tablet Take 10 mg by mouth daily.     aspirin EC 81 MG tablet Take 81 mg by mouth daily.     colchicine 0.6 MG tablet Take 1 tablet (0.6 mg total) by mouth daily. As needed during gout flare 30 tablet 0   guaiFENesin-codeine 100-10 MG/5ML syrup Take 5 mLs by mouth 3 (three) times daily as needed for cough. 120 mL 0   Multiple Vitamins-Minerals (MULTIVITAMIN) LIQD Take 1 tablet by mouth daily. Unknown  strength     omeprazole (PRILOSEC) 20 MG capsule Take 20 mg by mouth daily. Take 2 capsule daily     pravastatin (PRAVACHOL) 10 MG tablet TAKE 1 TABLET BY MOUTH AT BEDTIME 90 tablet 1   vitamin B-12 (CYANOCOBALAMIN) 1000 MCG tablet Take 2,500 mcg by mouth daily.     zolpidem (AMBIEN) 10 MG tablet TAKE 1 TABLET BY MOUTH AT BEDTIME AS NEEDED. 30 tablet 5   No facility-administered medications prior to visit.    Allergies  Allergen Reactions   Levofloxacin Other (See Comments)   Penicillins    Pepcid [Famotidine]     Cough   Penicillin G Other (See Comments) and Rash    Unknown Unknown   Penicillin G Benzathine & Proc Rash    Review of Systems  Constitutional:  Positive for fatigue and fever. Negative for chills and diaphoresis.  HENT:  Positive for congestion,  rhinorrhea, sinus pressure and sinus pain. Negative for ear pain and sore throat.   Eyes: Negative.   Respiratory:  Positive for cough. Negative for shortness of breath.   Cardiovascular:  Negative for chest pain.  Gastrointestinal:  Positive for diarrhea. Negative for nausea and vomiting.  Endocrine: Negative.   Genitourinary: Negative.   Musculoskeletal: Negative.   Skin: Negative.   Allergic/Immunologic: Negative.   Neurological:  Positive for headaches. Negative for dizziness.  Hematological: Negative.   Psychiatric/Behavioral: Negative.        Objective:    Physical Exam Vitals reviewed.  Constitutional:      Appearance: Normal appearance.  HENT:     Head: Normocephalic.     Right Ear: Tympanic membrane normal.     Left Ear: Tympanic membrane normal.     Nose: Congestion and rhinorrhea present.     Mouth/Throat:     Mouth: Mucous membranes are moist.     Pharynx: Posterior oropharyngeal erythema present.  Cardiovascular:     Rate and Rhythm: Normal rate and regular rhythm.     Pulses: Normal pulses.     Heart sounds: Normal heart sounds.  Pulmonary:     Effort: Pulmonary effort is normal.     Breath sounds: Normal breath sounds.  Abdominal:     General: Bowel sounds are normal.     Palpations: Abdomen is soft.  Skin:    General: Skin is warm and dry.     Capillary Refill: Capillary refill takes less than 2 seconds.  Neurological:     General: No focal deficit present.     Mental Status: He is alert and oriented to person, place, and time.  Psychiatric:        Mood and Affect: Mood normal.        Behavior: Behavior normal.    Pulse 75    Temp (!) 97.1 F (36.2 C)    Ht 6' (1.829 m)    Wt 228 lb (103.4 kg)    SpO2 99%    BMI 30.92 kg/m  BP 118/80    Pulse 75    Temp (!) 97.1 F (36.2 C)    Ht 6' (1.829 m)    Wt 228 lb (103.4 kg)    SpO2 99%    BMI 30.92 kg/m   Wt Readings from Last 3 Encounters:  01/09/21 228 lb (103.4 kg)  01/01/21 226 lb (102.5 kg)   10/20/20 228 lb 6.4 oz (103.6 kg)    Health Maintenance Due  Topic Date Due   TETANUS/TDAP  Never done   Zoster Vaccines- Shingrix (  1 of 2) Never done       Lab Results  Component Value Date   TSH 1.380 01/01/2021   Lab Results  Component Value Date   WBC 4.2 01/01/2021   HGB 14.9 01/01/2021   HCT 43.5 01/01/2021   MCV 90 01/01/2021   PLT 303 01/01/2021   Lab Results  Component Value Date   NA 140 01/01/2021   K 4.8 01/01/2021   CO2 24 01/01/2021   GLUCOSE 95 01/01/2021   BUN 15 01/01/2021   CREATININE 1.24 01/01/2021   BILITOT 0.7 01/01/2021   ALKPHOS 76 01/01/2021   AST 27 01/01/2021   ALT 23 01/01/2021   PROT 7.1 01/01/2021   ALBUMIN 4.5 01/01/2021   CALCIUM 9.4 01/01/2021   EGFR 61 01/01/2021   Lab Results  Component Value Date   CHOL 106 01/01/2021   Lab Results  Component Value Date   HDL 36 (L) 01/01/2021   Lab Results  Component Value Date   LDLCALC 49 01/01/2021   Lab Results  Component Value Date   TRIG 115 01/01/2021   Lab Results  Component Value Date   CHOLHDL 2.9 01/01/2021        Assessment & Plan:   1. Viral upper respiratory tract infection - fluticasone (FLONASE) 50 MCG/ACT nasal spray; Place 2 sprays into both nostrils daily.  Dispense: 16 g; Refill: 6  2. Acute cough - POCT Influenza A/B-negative - POC COVID-19 BinaxNow-negative      Orders Placed This Encounter  Procedures   POCT Influenza A/B   POC COVID-19 BinaxNow     Follow-up: PRN  An After Visit Summary was printed and given to the patient.  I, Rip Harbour, NP, have reviewed all documentation for this visit. The documentation on 01/09/21 for the exam, diagnosis, procedures, and orders are all accurate and complete.   Signed, Rip Harbour, NP [place] [phone]

## 2021-01-10 DIAGNOSIS — I499 Cardiac arrhythmia, unspecified: Secondary | ICD-10-CM | POA: Diagnosis not present

## 2021-01-30 ENCOUNTER — Encounter: Payer: Self-pay | Admitting: Legal Medicine

## 2021-01-30 ENCOUNTER — Other Ambulatory Visit: Payer: Self-pay

## 2021-01-30 ENCOUNTER — Ambulatory Visit (INDEPENDENT_AMBULATORY_CARE_PROVIDER_SITE_OTHER): Payer: Medicare Other | Admitting: Legal Medicine

## 2021-01-30 VITALS — BP 140/90 | HR 86 | Temp 98.7°F | Resp 14 | Wt 228.0 lb

## 2021-01-30 DIAGNOSIS — M1A9XX0 Chronic gout, unspecified, without tophus (tophi): Secondary | ICD-10-CM | POA: Diagnosis not present

## 2021-01-30 DIAGNOSIS — M79675 Pain in left toe(s): Secondary | ICD-10-CM | POA: Diagnosis not present

## 2021-01-30 NOTE — Progress Notes (Signed)
Acute Office Visit  Subjective:    Patient ID: Clarence Hill, male    DOB: 09-Apr-1945, 76 y.o.   MRN: [id number]  Chief Complaint  Patient presents with   Toe Pain    Left toe pain    HPI: Patient is in today for left first metatarsal pain since last night. He was working in the garage with his tennis shoes. He thought that he maybe has a splinter or piece of metal. He checked his big toe and he did not find anything.  He mentioned that it is tender to the touch however it did not bothering him if anything touch it. He noticed redness and swelling. He takes allopurinol 158m daily.  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   Left lower quadrant pain 01/26/2020   Nodule of finger of left hand 01/14/2018   Palpitations 05/12/2017   Proud flesh 09/15/2019   Retinal edema 02/09/2019   S/P cataract extraction and insertion of intraocular lens, left 02/09/2019   Thrombosed external hemorrhoid 12/18/2017   Vitreous debris 02/09/2019    Past Surgical History:  Procedure Laterality Date   APPENDECTOMY     CARDIAC CATHETERIZATION     CATARACT EXTRACTION     EYE SURGERY Right    Laser    Family History  Problem Relation Age of Onset   Thyroid cancer Mother    CAD Father    Breast cancer Sister    Renal cancer Sister    CAD Paternal Uncle     Social History   Socioeconomic History   Marital status: Married    Spouse name: Not on file   Number of children: 2   Years of education: Not on file   Highest education level: Not on file  Occupational History   Not on file  Tobacco  Use   Smoking status: Former   Smokeless tobacco: Never  Vaping Use   Vaping Use: Never used  Substance and Sexual Activity   Alcohol use: Yes    Alcohol/week: 5.0 standard drinks    Types: 5 Glasses of wine per week   Drug use: Never   Sexual activity: Not on file  Other Topics Concern   Not on file  Social History Narrative   Not on file   Social Determinants of Health   Financial Resource Strain: Not on file  Food Insecurity: Not on file  Transportation Needs: Not on file  Physical Activity: Not on file  Stress: Not on file  Social Connections: Not on file  Intimate Partner Violence: Not on file    Outpatient Medications Prior to Visit  Medication Sig Dispense Refill   allopurinol (ZYLOPRIM) 100 MG tablet Take 100 mg by mouth daily.  1   amLODipine (NORVASC) 10 MG tablet Take 10 mg by mouth daily.     aspirin EC 81 MG tablet Take 81 mg by mouth daily.     colchicine 0.6 MG tablet Take 1 tablet (0.6 mg total) by mouth daily. As needed during gout flare 30 tablet 0   fluticasone (FLONASE) 50 MCG/ACT nasal spray Place 2 sprays into  both nostrils daily. 16 g 6   Multiple Vitamins-Minerals (MULTIVITAMIN) LIQD Take 1 tablet by mouth daily. Unknown strength     omeprazole (PRILOSEC) 20 MG capsule Take 20 mg by mouth daily. Take 2 capsule daily     pravastatin (PRAVACHOL) 10 MG tablet TAKE 1 TABLET BY MOUTH AT BEDTIME 90 tablet 1   vitamin B-12 (CYANOCOBALAMIN) 1000 MCG tablet Take 2,500 mcg by mouth daily.     zolpidem (AMBIEN) 10 MG tablet TAKE 1 TABLET BY MOUTH AT BEDTIME AS NEEDED. 30 tablet 5   guaiFENesin-codeine 100-10 MG/5ML syrup Take 5 mLs by mouth 3 (three) times daily as needed for cough. 120 mL 0   No facility-administered medications prior to visit.    Allergies  Allergen Reactions   Levofloxacin Other (See Comments)   Penicillins    Pepcid [Famotidine]     Cough   Penicillin G Other (See Comments) and Rash    Unknown Unknown   Penicillin G Benzathine &  Proc Rash    Review of Systems  Constitutional:  Negative for chills, fatigue, fever and unexpected weight change.  HENT:  Negative for congestion, ear pain, sinus pain and sore throat.   Respiratory:  Negative for cough and shortness of breath.   Cardiovascular:  Negative for chest pain and palpitations.  Gastrointestinal:  Negative for abdominal pain, blood in stool, constipation, diarrhea, nausea and vomiting.  Endocrine: Negative for polydipsia.  Genitourinary:  Negative for dysuria.  Musculoskeletal:  Positive for arthralgias (left 1st toe pain) and back pain.  Skin:  Negative for rash.  Neurological:  Negative for headaches.      Objective:    Physical Exam Constitutional:      Appearance: Normal appearance.  Cardiovascular:     Rate and Rhythm: Normal rate and regular rhythm.     Pulses: Normal pulses.     Heart sounds: Normal heart sounds.  Pulmonary:     Effort: Pulmonary effort is normal.     Breath sounds: Normal breath sounds.  Abdominal:     General: Bowel sounds are normal.     Palpations: Abdomen is soft. There is no mass.     Tenderness: There is no abdominal tenderness.  Skin:    Findings: Erythema present.     Comments: Tenderness on left 1st metatarsal to the touch.  Neurological:     Mental Status: He is alert.  Psychiatric:        Mood and Affect: Mood normal.        Behavior: Behavior normal.        Thought Content: Thought content normal.    BP 140/90    Pulse 86    Temp 98.7 F (37.1 C)    Resp 14    Wt 228 lb (103.4 kg)    SpO2 97%    BMI 30.92 kg/m  Wt Readings from Last 3 Encounters:  01/30/21 228 lb (103.4 kg)  01/09/21 228 lb (103.4 kg)  01/01/21 226 lb (102.5 kg)    Health Maintenance Due  Topic Date Due   TETANUS/TDAP  Never done   Zoster Vaccines- Shingrix (1 of 2) Never done    There are no preventive care reminders to display for this patient.   Lab Results  Component Value Date   TSH 1.380 01/01/2021   Lab Results   Component Value Date   WBC 4.2 01/01/2021   HGB 14.9 01/01/2021   HCT 43.5 01/01/2021   MCV 90 01/01/2021   PLT 303 01/01/2021  Lab Results  Component Value Date   NA 140 01/01/2021   K 4.8 01/01/2021   CO2 24 01/01/2021   GLUCOSE 95 01/01/2021   BUN 15 01/01/2021   CREATININE 1.24 01/01/2021   BILITOT 0.7 01/01/2021   ALKPHOS 76 01/01/2021   AST 27 01/01/2021   ALT 23 01/01/2021   PROT 7.1 01/01/2021   ALBUMIN 4.5 01/01/2021   CALCIUM 9.4 01/01/2021   EGFR 61 01/01/2021   Lab Results  Component Value Date   CHOL 106 01/01/2021   Lab Results  Component Value Date   HDL 36 (L) 01/01/2021   Lab Results  Component Value Date   LDLCALC 49 01/01/2021   Lab Results  Component Value Date   TRIG 115 01/01/2021   Lab Results  Component Value Date   CHOLHDL 2.9 01/01/2021   No results found for: HGBA1C     Assessment & Plan:   Problem List Items Addressed This Visit       Other   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  An After Visit Summary was printed and given to the patient.  Reinaldo Meeker, MD [facility] [phone]

## 2021-01-31 DIAGNOSIS — N5201 Erectile dysfunction due to arterial insufficiency: Secondary | ICD-10-CM | POA: Diagnosis not present

## 2021-01-31 LAB — URIC ACID: Uric Acid: 5.2 mg/dL (ref 3.8–8.4)

## 2021-01-31 NOTE — Progress Notes (Signed)
Uric acid 5.2 good range lp

## 2021-02-01 DIAGNOSIS — S99922A Unspecified injury of left foot, initial encounter: Secondary | ICD-10-CM | POA: Diagnosis not present

## 2021-02-05 ENCOUNTER — Other Ambulatory Visit: Payer: Self-pay | Admitting: Family Medicine

## 2021-03-01 ENCOUNTER — Other Ambulatory Visit: Payer: Self-pay

## 2021-03-01 DIAGNOSIS — M1A9XX0 Chronic gout, unspecified, without tophus (tophi): Secondary | ICD-10-CM

## 2021-03-01 MED ORDER — COLCHICINE 0.6 MG PO TABS: 0.6000 mg | ORAL_TABLET | Freq: Every day | ORAL | 0 refills | Status: DC

## 2021-03-09 ENCOUNTER — Other Ambulatory Visit: Payer: Self-pay

## 2021-03-09 ENCOUNTER — Encounter: Payer: Self-pay | Admitting: Legal Medicine

## 2021-03-09 ENCOUNTER — Ambulatory Visit (INDEPENDENT_AMBULATORY_CARE_PROVIDER_SITE_OTHER): Payer: Medicare Other | Admitting: Legal Medicine

## 2021-03-09 VITALS — BP 100/70 | HR 87 | Temp 98.5°F | Resp 15 | Ht 72.0 in | Wt 225.0 lb

## 2021-03-09 DIAGNOSIS — L509 Urticaria, unspecified: Secondary | ICD-10-CM | POA: Diagnosis not present

## 2021-03-09 MED ORDER — TRIAMCINOLONE ACETONIDE 40 MG/ML IJ SUSP
80.0000 mg | Freq: Once | INTRAMUSCULAR | Status: AC
  Administered 2021-03-09: 80 mg via INTRAMUSCULAR

## 2021-03-09 NOTE — Progress Notes (Signed)
Acute Office Visit  Subjective:    Patient ID: Clarence Hill, male    DOB: November 27, 1945, 76 y.o.   MRN: [id number]  Chief Complaint  Patient presents with   Rash    HPI: Patient is in today for Rash like hives, itching, redness since one week ago in the groin area. He thought Ambien caused it, so the rash go away. He noticed today in the same area, and he has not taking ambien any more. He took antihistamines and it helps a little bit. He denied any new detergent.  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   Left lower quadrant pain 01/26/2020   Nodule of finger of left hand 01/14/2018   Palpitations 05/12/2017   Proud flesh 09/15/2019   Retinal edema 02/09/2019   S/P cataract extraction and insertion of intraocular lens, left 02/09/2019   Thrombosed external hemorrhoid 12/18/2017   Vitreous debris 02/09/2019    Past Surgical History:  Procedure Laterality Date   APPENDECTOMY     CARDIAC CATHETERIZATION     CATARACT EXTRACTION     EYE SURGERY Right    Laser    Family History  Problem Relation Age of Onset   Thyroid cancer Mother    CAD Father    Breast cancer Sister    Renal cancer Sister    CAD Paternal Uncle     Social History   Socioeconomic History   Marital status: Married    Spouse name: Not on file   Number of children: 2   Years of education: Not on file   Highest education level: Not on file  Occupational History   Not on file  Tobacco Use   Smoking status: Former   Smokeless tobacco: Never  Vaping Use   Vaping Use: Never used  Substance and Sexual  Activity   Alcohol use: Yes    Alcohol/week: 5.0 standard drinks    Types: 5 Glasses of wine per week   Drug use: Never   Sexual activity: Not on file  Other Topics Concern   Not on file  Social History Narrative   Not on file   Social Determinants of Health   Financial Resource Strain: Not on file  Food Insecurity: Not on file  Transportation Needs: Not on file  Physical Activity: Not on file  Stress: Not on file  Social Connections: Not on file  Intimate Partner Violence: Not on file    Outpatient Medications Prior to Visit  Medication Sig Dispense Refill   allopurinol (ZYLOPRIM) 100 MG tablet Take 100 mg by mouth daily.  1   amLODipine (NORVASC) 10 MG tablet Take 10 mg by mouth daily.     aspirin EC 81 MG tablet Take 81 mg by mouth daily.     colchicine 0.6 MG tablet Take 1 tablet (0.6 mg total) by mouth daily. As needed during gout flare 30 tablet 0   Multiple Vitamins-Minerals (MULTIVITAMIN) LIQD Take 1 tablet by mouth daily. Unknown strength     omeprazole (PRILOSEC) 20 MG capsule Take 20 mg by mouth daily. Take 2 capsule daily  omeprazole (PRILOSEC) 40 MG capsule Take 40 mg by mouth daily.     pravastatin (PRAVACHOL) 10 MG tablet TAKE 1 TABLET BY MOUTH AT BEDTIME 90 tablet 1   vitamin B-12 (CYANOCOBALAMIN) 1000 MCG tablet Take 2,500 mcg by mouth daily.     zolpidem (AMBIEN) 10 MG tablet TAKE 1 TABLET BY MOUTH AT BEDTIME AS NEEDED. 30 tablet 5   fluticasone (FLONASE) 50 MCG/ACT nasal spray Place 2 sprays into both nostrils daily. 16 g 6   guaiFENesin-codeine 100-10 MG/5ML syrup TAKE 5 MILLILITERS BY MOUTH 3 TIMES A DAY AS NEEDED FOR COUGH 120 mL 0   No facility-administered medications prior to visit.    Allergies  Allergen Reactions   Levofloxacin Other (See Comments)   Penicillins    Pepcid [Famotidine]     Cough   Penicillin G Other (See Comments) and Rash    Unknown Unknown   Penicillin G Benzathine & Proc Rash    Review of Systems  Constitutional:   Negative for chills, fatigue, fever and unexpected weight change.  HENT:  Negative for congestion, ear pain, sinus pain and sore throat.   Respiratory:  Negative for cough and shortness of breath.   Cardiovascular:  Negative for chest pain and palpitations.  Gastrointestinal:  Negative for abdominal pain, blood in stool, constipation, diarrhea, nausea and vomiting.  Endocrine: Negative for polydipsia.  Genitourinary:  Negative for dysuria.  Musculoskeletal:  Negative for back pain.  Skin:  Positive for rash.  Neurological:  Negative for headaches.      Objective:    Physical Exam Constitutional:      Appearance: Normal appearance. He is obese.  HENT:     Head: Normocephalic.     Right Ear: Tympanic membrane, ear canal and external ear normal.     Left Ear: Tympanic membrane, ear canal and external ear normal.     Nose: Nose normal.     Mouth/Throat:     Mouth: Mucous membranes are moist.     Pharynx: Oropharynx is clear. No posterior oropharyngeal erythema.  Eyes:     Extraocular Movements: Extraocular movements intact.     Conjunctiva/sclera: Conjunctivae normal.     Pupils: Pupils are equal, round, and reactive to light.  Neck:     Vascular: No carotid bruit.  Cardiovascular:     Rate and Rhythm: Normal rate and regular rhythm.     Pulses: Normal pulses.     Heart sounds: Normal heart sounds. No murmur heard. Pulmonary:     Effort: Pulmonary effort is normal.     Breath sounds: Normal breath sounds.  Abdominal:     General: Bowel sounds are normal.     Palpations: Abdomen is soft. There is no mass.  Musculoskeletal:        General: Normal range of motion.     Cervical back: Normal range of motion. No tenderness.     Right lower leg: No edema.     Left lower leg: No edema.  Skin:    Findings: Erythema and rash (both groins area.) present.  Neurological:     Gait: Gait normal.     Deep Tendon Reflexes: Reflexes normal.  Psychiatric:        Mood and Affect: Mood  normal.        Behavior: Behavior normal.        Thought Content: Thought content normal.    BP 100/70    Pulse 87    Temp 98.5 F (36.9 C)  Resp 15    Ht 6' (1.829 m)    Wt 225 lb (102.1 kg)    SpO2 96%    BMI 30.52 kg/m  Wt Readings from Last 3 Encounters:  03/09/21 225 lb (102.1 kg)  01/30/21 228 lb (103.4 kg)  01/09/21 228 lb (103.4 kg)    Health Maintenance Due  Topic Date Due   TETANUS/TDAP  Never done   Zoster Vaccines- Shingrix (1 of 2) Never done    There are no preventive care reminders to display for this patient.   Lab Results  Component Value Date   TSH 1.380 01/01/2021   Lab Results  Component Value Date   WBC 4.2 01/01/2021   HGB 14.9 01/01/2021   HCT 43.5 01/01/2021   MCV 90 01/01/2021   PLT 303 01/01/2021   Lab Results  Component Value Date   NA 140 01/01/2021   K 4.8 01/01/2021   CO2 24 01/01/2021   GLUCOSE 95 01/01/2021   BUN 15 01/01/2021   CREATININE 1.24 01/01/2021   BILITOT 0.7 01/01/2021   ALKPHOS 76 01/01/2021   AST 27 01/01/2021   ALT 23 01/01/2021   PROT 7.1 01/01/2021   ALBUMIN 4.5 01/01/2021   CALCIUM 9.4 01/01/2021   EGFR 61 01/01/2021   Lab Results  Component Value Date   CHOL 106 01/01/2021   Lab Results  Component Value Date   HDL 36 (L) 01/01/2021   Lab Results  Component Value Date   LDLCALC 49 01/01/2021   Lab Results  Component Value Date   TRIG 115 01/01/2021   Lab Results  Component Value Date   CHOLHDL 2.9 01/01/2021   No results found for: HGBA1C     Assessment & Plan:  Diagnoses and all orders for this visit: Urticaria -     triamcinolone acetonide (KENALOG-40) injection 80 mg Patient has urticaria in the groin area that started several days ago.  He is using Kenalog cream without benefit.  We gave him a Kenalog shot and recommended all water should go to 2 rinses.   Meds ordered this encounter  Medications   triamcinolone acetonide (KENALOG-40) injection 80 mg       Follow-up: Return  if symptoms worsen or fail to improve.  An After Visit Summary was printed and given to the patient.  Reinaldo Meeker, MD [facility] [phone]

## 2021-03-11 ENCOUNTER — Encounter: Payer: Self-pay | Admitting: Legal Medicine

## 2021-03-12 ENCOUNTER — Other Ambulatory Visit: Payer: Self-pay | Admitting: Family Medicine

## 2021-03-12 DIAGNOSIS — L821 Other seborrheic keratosis: Secondary | ICD-10-CM | POA: Diagnosis not present

## 2021-03-12 DIAGNOSIS — L299 Pruritus, unspecified: Secondary | ICD-10-CM | POA: Diagnosis not present

## 2021-03-12 DIAGNOSIS — L300 Nummular dermatitis: Secondary | ICD-10-CM | POA: Diagnosis not present

## 2021-03-12 DIAGNOSIS — M1A9XX0 Chronic gout, unspecified, without tophus (tophi): Secondary | ICD-10-CM

## 2021-03-12 DIAGNOSIS — D2339 Other benign neoplasm of skin of other parts of face: Secondary | ICD-10-CM | POA: Diagnosis not present

## 2021-03-12 NOTE — Telephone Encounter (Signed)
Did not get script at [facility]. Wants change to [facility]. Sent.   Royce Macadamia, [place] 03/12/21 4:07 PM

## 2021-03-22 DIAGNOSIS — U071 COVID-19: Secondary | ICD-10-CM | POA: Diagnosis not present

## 2021-04-10 ENCOUNTER — Other Ambulatory Visit: Payer: Self-pay | Admitting: Cardiology

## 2021-05-24 ENCOUNTER — Encounter: Payer: Self-pay | Admitting: Nurse Practitioner

## 2021-05-24 ENCOUNTER — Ambulatory Visit (INDEPENDENT_AMBULATORY_CARE_PROVIDER_SITE_OTHER): Payer: Medicare Other | Admitting: Nurse Practitioner

## 2021-05-24 VITALS — BP 136/78 | HR 85 | Temp 97.3°F | Ht 72.0 in | Wt 231.0 lb

## 2021-05-24 DIAGNOSIS — R21 Rash and other nonspecific skin eruption: Secondary | ICD-10-CM | POA: Diagnosis not present

## 2021-05-24 DIAGNOSIS — Z87828 Personal history of other (healed) physical injury and trauma: Secondary | ICD-10-CM

## 2021-05-24 DIAGNOSIS — R051 Acute cough: Secondary | ICD-10-CM | POA: Diagnosis not present

## 2021-05-24 DIAGNOSIS — J018 Other acute sinusitis: Secondary | ICD-10-CM

## 2021-05-24 MED ORDER — NYSTATIN-TRIAMCINOLONE 100000-0.1 UNIT/GM-% EX CREA: 1.0000 "application " | TOPICAL_CREAM | Freq: Two times a day (BID) | CUTANEOUS | 0 refills | Status: DC

## 2021-05-24 MED ORDER — FLUTICASONE PROPIONATE 50 MCG/ACT NA SUSP: 2.0000 | Freq: Every day | NASAL | 6 refills | Status: DC

## 2021-05-24 MED ORDER — AZITHROMYCIN 250 MG PO TABS: ORAL_TABLET | ORAL | 0 refills | Status: AC

## 2021-05-24 MED ORDER — PROMETHAZINE-DM 6.25-15 MG/5ML PO SYRP: 5.0000 mL | ORAL_SOLUTION | Freq: Four times a day (QID) | ORAL | 0 refills | Status: DC | PRN

## 2021-05-24 NOTE — Progress Notes (Signed)
? ?Acute Office Visit ? ?Subjective:  ? ? Patient ID: Clarence Hill, male    DOB: 1945/06/26, 76 y.o.   MRN: [id number] ? ?Chief Complaint  ?Patient presents with  ? Sinusitis  ? ? ?HPI: ?Patient is in today for Upper respiratory symptoms ?He complains of post nasal drip, productive cough with thick yellow colored sputum, and sore throat. Onset of symptoms was last night and staying constant.He is drinking plenty of fluids.  Pt states he has recurrent hives on his right groin for several months. Reports he was seen by dermatology recently. He was not prescribed any treatment for rash. States treatment has included OTC diaper rash cream and "leftover" cream his wife had, possibly a steroid cream.  ? ?Past Medical History:  ?Diagnosis Date  ? Abnormal EKG 05/12/2017  ? Ascending aortic aneurysm 09/09/2019  ? Atypical chest pain 05/12/2017  ? Benign essential hypertension 05/19/2015  ? Branch retinal vein occlusion of right eye   ? Gastroesophageal reflux disease 05/19/2015  ? GERD (gastroesophageal reflux disease)   ? Hypertension   ? Intertriginous skin ulcer with fat layer exposed ([place]) 09/29/2019  ? 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 Left lower quadrant pain 01/26/2020  ? Nodule of finger of left hand 01/14/2018  ? Palpitations 05/12/2017  ? Proud flesh 09/15/2019  ? Retinal edema 02/09/2019  ? S/P cataract extraction and insertion of intraocular lens, left 02/09/2019  ? Thrombosed external hemorrhoid 12/18/2017  ? Vitreous debris 02/09/2019  ? ? ?Past Surgical History:  ?Procedure Laterality Date  ? APPENDECTOMY    ? CARDIAC CATHETERIZATION    ? CATARACT EXTRACTION    ? EYE SURGERY Right   ? Laser  ? ? ?Family History  ?Problem Relation Age of Onset  ? Thyroid cancer Mother   ? CAD Father   ? Breast cancer Sister   ? Renal cancer Sister   ? CAD Paternal Uncle   ? ? ?Social History  ? ?Socioeconomic History  ? Marital status: Married  ?  Spouse name: Not on file  ? Number of children: 2  ? Years of education:  Not on file  ? Highest education level: Not on file  ?Occupational History  ? Not on file  ?Tobacco Use  ? Smoking status: Former  ? Smokeless tobacco: Never  ?Vaping Use  ? Vaping Use: Never used  ?Substance and Sexual Activity  ? Alcohol use: Yes  ?  Alcohol/week: 5.0 standard drinks  ?  Types: 5 Glasses of wine per week  ? Drug use: Never  ? Sexual activity: Not on file  ?Other Topics Concern  ? Not on file  ?Social History Narrative  ? Not on file  ? ?Social Determinants of Health  ? ?Financial Resource Strain: Not on file  ?Food Insecurity: Not on file  ?Transportation Needs: Not on file  ?Physical Activity: Not on file  ?Stress: Not on file  ?Social Connections: Not on file  ?Intimate Partner Violence: Not on file  ? ? ?Outpatient Medications Prior to Visit  ?Medication Sig Dispense Refill  ? allopurinol (ZYLOPRIM) 100 MG tablet Take 100 mg by mouth daily.  1  ? amLODipine (NORVASC) 10 MG tablet Take 10 mg by mouth daily.    ? aspirin EC 81 MG tablet Take 81 mg by mouth daily.    ? colchicine 0.6 MG tablet TAKE ONE TABLET BY MOUTH DAILY AS NEEDED DURING GOUT FLARE 30 tablet 0  ? Multiple Vitamins-Minerals (MULTIVITAMIN) LIQD Take 1 tablet by mouth  daily. Unknown strength    ? omeprazole (PRILOSEC) 40 MG capsule Take 40 mg by mouth daily.    ? pravastatin (PRAVACHOL) 10 MG tablet TAKE 1 TABLET BY MOUTH AT BEDTIME 90 tablet 1  ? vitamin B-12 (CYANOCOBALAMIN) 1000 MCG tablet Take 2,500 mcg by mouth daily.    ? zolpidem (AMBIEN) 10 MG tablet TAKE 1 TABLET BY MOUTH AT BEDTIME AS NEEDED. 30 tablet 5  ? omeprazole (PRILOSEC) 20 MG capsule Take 20 mg by mouth daily. Take 2 capsule daily    ? ?No facility-administered medications prior to visit.  ? ? ?Allergies  ?Allergen Reactions  ? Levofloxacin Other (See Comments)  ? Penicillins   ? Pepcid [Famotidine]   ?  Cough  ? Penicillin G Other (See Comments) and Rash  ?  Unknown ?Unknown  ? Penicillin G Benzathine & Proc Rash  ? ? ?Review of Systems  ?Constitutional:   Positive for fatigue. Negative for chills and fever.  ?HENT:  Positive for postnasal drip, rhinorrhea, sinus pressure, sinus pain and sore throat. Negative for congestion and ear pain.   ?Respiratory:  Positive for cough. Negative for shortness of breath.   ?Cardiovascular:  Negative for chest pain.  ?Gastrointestinal:  Negative for diarrhea, nausea and vomiting.  ?Allergic/Immunologic: Positive for environmental allergies.  ?Neurological:  Positive for headaches. Negative for dizziness.  ? ?   ?Objective:  ?  ?Physical Exam ?Vitals reviewed. Exam conducted with a chaperone present.  ?HENT:  ?   Right Ear: Tympanic membrane normal.  ?   Left Ear: Tympanic membrane normal.  ?   Nose: Nose normal.  ?   Mouth/Throat:  ?   Mouth: Mucous membranes are moist.  ?Cardiovascular:  ?   Rate and Rhythm: Normal rate and regular rhythm.  ?   Pulses: Normal pulses.  ?   Heart sounds: Normal heart sounds.  ?Pulmonary:  ?   Effort: Pulmonary effort is normal.  ?   Breath sounds: Normal breath sounds.  ?Abdominal:  ?   General: Bowel sounds are normal.  ?   Palpations: Abdomen is soft.  ?Musculoskeletal:     ?   General: Normal range of motion.  ?   Cervical back: Neck supple.  ?Skin: ?   General: Skin is warm and dry.  ?   Capillary Refill: Capillary refill takes less than 2 seconds.  ?   Findings: Rash (right groin) present. Rash is urticarial.  ? ?    ?Neurological:  ?   General: No focal deficit present.  ?   Mental Status: He is alert and oriented to person, place, and time.  ?Psychiatric:     ?   Mood and Affect: Mood normal.     ?   Behavior: Behavior normal.  ? ? ?BP 136/78   Pulse 85   Temp (!) 97.3 ?F (36.3 ?C)   Ht 6' (1.829 m)   Wt 231 lb (104.8 kg)   SpO2 100%   BMI 31.33 kg/m?   ?Wt Readings from Last 3 Encounters:  ?05/24/21 231 lb (104.8 kg)  ?03/09/21 225 lb (102.1 kg)  ?01/30/21 228 lb (103.4 kg)  ? ? ?Health Maintenance Due  ?Topic Date Due  ? TETANUS/TDAP  Never done  ? Zoster Vaccines- Shingrix (1 of 2)  Never done  ? ? ? ? ? ?Lab Results  ?Component Value Date  ? TSH 1.380 01/01/2021  ? ?Lab Results  ?Component Value Date  ? WBC 4.2 01/01/2021  ? HGB 14.9 01/01/2021  ?  HCT 43.5 01/01/2021  ? MCV 90 01/01/2021  ? PLT 303 01/01/2021  ? ?Lab Results  ?Component Value Date  ? NA 140 01/01/2021  ? K 4.8 01/01/2021  ? CO2 24 01/01/2021  ? GLUCOSE 95 01/01/2021  ? BUN 15 01/01/2021  ? CREATININE 1.24 01/01/2021  ? BILITOT 0.7 01/01/2021  ? ALKPHOS 76 01/01/2021  ? AST 27 01/01/2021  ? ALT 23 01/01/2021  ? PROT 7.1 01/01/2021  ? ALBUMIN 4.5 01/01/2021  ? CALCIUM 9.4 01/01/2021  ? EGFR 61 01/01/2021  ? ?Lab Results  ?Component Value Date  ? CHOL 106 01/01/2021  ? ?Lab Results  ?Component Value Date  ? HDL 36 (L) 01/01/2021  ? ?Lab Results  ?Component Value Date  ? LDLCALC 49 01/01/2021  ? ?Lab Results  ?Component Value Date  ? TRIG 115 01/01/2021  ? ?Lab Results  ?Component Value Date  ? CHOLHDL 2.9 01/01/2021  ? ? ? ?   ?Assessment & Plan:  ? ?1. Acute non-recurrent sinusitis of other sinus ?- fluticasone (FLONASE) 50 MCG/ACT nasal spray; Place 2 sprays into both nostrils daily.  Dispense: 16 g; Refill: 6 ?- azithromycin (ZITHROMAX) 250 MG tablet; Take 2 tablets on day 1, then 1 tablet daily on days 2 through 5  Dispense: 6 tablet; Refill: 0 ?- promethazine-dextromethorphan (PROMETHAZINE-DM) 6.25-15 MG/5ML syrup; Take 5 mLs by mouth 4 (four) times daily as needed.  Dispense: 118 mL; Refill: 0 ? ?2. Acute cough ?- promethazine-dextromethorphan (PROMETHAZINE-DM) 6.25-15 MG/5ML syrup; Take 5 mLs by mouth 4 (four) times daily as needed.  Dispense: 118 mL; Refill: 0 ? ?3. Groin rash ?- nystatin-triamcinolone (MYCOLOG II) cream; Apply 1 application. topically 2 (two) times daily.  Dispense: 30 g; Refill: 0 ? ?4. History of trauma of chest ?  ? ?Take Z-pack as prescribed ?Use Flonase nasal spray daily ?Mucinex every 12 hour ?Push fluids, especially water ?Delsym cough syrup as needed for cough ?May take Promethazine-DM cough  syrup up to 4 times daily ?Apply Nystatin-triamcinolone cream to right groin twice daily ?Follow-up as needed ? ?  ? ?Follow-up: PRN ? ?An After Visit Summary was printed and given to the patient. ? ?I, Einar Crow

## 2021-05-24 NOTE — Patient Instructions (Addendum)
Take Z-pack as prescribed ?Use Flonase nasal spray daily ?Mucinex every 12 hour ?Push fluids, especially water ?Delsym cough syrup as needed for cough ?May take Promethazine-DM cough syrup up to 4 times daily ?Apply Nystatin-triamcinolone cream to right groin twice daily ?Follow-up as needed ? ? ? ?Sinus Infection, Adult ?A sinus infection is soreness and swelling (inflammation) of your sinuses. Sinuses are hollow spaces in the bones around your face. They are located: ?Around your eyes. ?In the middle of your forehead. ?Behind your nose. ?In your cheekbones. ?Your sinuses and nasal passages are lined with a fluid called mucus. Mucus drains out of your sinuses. Swelling can trap mucus in your sinuses. This lets germs (bacteria, virus, or fungus) grow, which leads to infection. Most of the time, this condition is caused by a virus. ?What are the causes? ?Allergies. ?Asthma. ?Germs. ?Things that block your nose or sinuses. ?Growths in the nose (nasal polyps). ?Chemicals or irritants in the air. ?A fungus. This is rare. ?What increases the risk? ?Having a weak body defense system (immune system). ?Doing a lot of swimming or diving. ?Using nasal sprays too much. ?Smoking. ?What are the signs or symptoms? ?The main symptoms of this condition are pain and a feeling of pressure around the sinuses. Other symptoms include: ?Stuffy nose (congestion). This may make it hard to breathe through your nose. ?Runny nose (drainage). ?Soreness, swelling, and warmth in the sinuses. ?A cough that may get worse at night. ?Being unable to smell and taste. ?Mucus that collects in the throat or the back of the nose (postnasal drip). This may cause a sore throat or bad breath. ?Being very tired (fatigued). ?A fever. ?How is this diagnosed? ?Your symptoms. ?Your medical history. ?A physical exam. ?Tests to find out if your condition is short-term (acute) or long-term (chronic). Your doctor may: ?Check your nose for growths (polyps). ?Check  your sinuses using a tool that has a light on one end (endoscope). ?Check for allergies or germs. ?Do imaging tests, such as an MRI or CT scan. ?How is this treated? ?Treatment for this condition depends on the cause and whether it is short-term or long-term. ?If caused by a virus, your symptoms should go away on their own within 10 days. You may be given medicines to relieve symptoms. They include: ?Medicines that shrink swollen tissue in the nose. ?A spray that treats swelling of the nostrils. ?Rinses that help get rid of thick mucus in your nose (nasal saline washes). ?Medicines that treat allergies (antihistamines). ?Over-the-counter pain relievers. ?If caused by bacteria, your doctor may wait to see if you will get better without treatment. You may be given antibiotic medicine if you have: ?A very bad infection. ?A weak body defense system. ?If caused by growths in the nose, surgery may be needed. ?Follow these instructions at home: ?Medicines ?Take, use, or apply over-the-counter and prescription medicines only as told by your doctor. These may include nasal sprays. ?If you were prescribed an antibiotic medicine, take it as told by your doctor. Do not stop taking it even if you start to feel better. ?Hydrate and humidify ? ?Drink enough water to keep your pee (urine) pale yellow. ?Use a cool mist humidifier to keep the humidity level in your home above 50%. ?Breathe in steam for 10-15 minutes, 3-4 times a day, or as told by your doctor. You can do this in the bathroom while a hot shower is running. ?Try not to spend time in cool or dry air. ?Rest ?Rest as  much as you can. ?Sleep with your head raised (elevated). ?Make sure you get enough sleep each night. ?General instructions ? ?Put a warm, moist washcloth on your face 3-4 times a day, or as often as told by your doctor. ?Use nasal saline washes as often as told by your doctor. ?Wash your hands often with soap and water. If you cannot use soap and water, use  hand sanitizer. ?Do not smoke. Avoid being around people who are smoking (secondhand smoke). ?Keep all follow-up visits. ?Contact a doctor if: ?You have a fever. ?Your symptoms get worse. ?Your symptoms do not get better within 10 days. ?Get help right away if: ?You have a very bad headache. ?You cannot stop vomiting. ?You have very bad pain or swelling around your face or eyes. ?You have trouble seeing. ?You feel confused. ?Your neck is stiff. ?You have trouble breathing. ?These symptoms may be an emergency. Get help right away. Call 911. ?Do not wait to see if the symptoms will go away. ?Do not drive yourself to the hospital. ?Summary ?A sinus infection is swelling of your sinuses. Sinuses are hollow spaces in the bones around your face. ?This condition is caused by tissues in your nose that become inflamed or swollen. This traps germs. These can lead to infection. ?If you were prescribed an antibiotic medicine, take it as told by your doctor. Do not stop taking it even if you start to feel better. ?Keep all follow-up visits. ?This information is not intended to replace advice given to you by your health care provider. Make sure you discuss any questions you have with your health care provider. ?Document Revised: 12/19/2020 Document Reviewed: 12/19/2020 ?Elsevier Patient Education ? [place]. ? ?

## 2021-05-30 ENCOUNTER — Other Ambulatory Visit: Payer: Self-pay | Admitting: Nurse Practitioner

## 2021-05-30 ENCOUNTER — Telehealth: Payer: Self-pay

## 2021-05-30 DIAGNOSIS — J018 Other acute sinusitis: Secondary | ICD-10-CM

## 2021-05-30 DIAGNOSIS — R051 Acute cough: Secondary | ICD-10-CM

## 2021-05-30 MED ORDER — PROMETHAZINE-DM 6.25-15 MG/5ML PO SYRP: 5.0000 mL | ORAL_SOLUTION | Freq: Four times a day (QID) | ORAL | 0 refills | Status: DC | PRN

## 2021-05-30 MED ORDER — BENZONATATE 100 MG PO CAPS: 100.0000 mg | ORAL_CAPSULE | Freq: Two times a day (BID) | ORAL | 0 refills | Status: DC | PRN

## 2021-05-30 NOTE — Telephone Encounter (Signed)
Patient called stating that his cough is still there and is just a bad and was wondering if  we could call him in some more cough medication and some Tessalon pearls as they have helped him in the past.  ?

## 2021-05-30 NOTE — Telephone Encounter (Signed)
Spoke with patient, verbalized understanding and had no questions at this time.  

## 2021-06-26 ENCOUNTER — Encounter: Payer: Self-pay | Admitting: Family Medicine

## 2021-06-26 ENCOUNTER — Ambulatory Visit (INDEPENDENT_AMBULATORY_CARE_PROVIDER_SITE_OTHER): Payer: Medicare Other | Admitting: Family Medicine

## 2021-06-26 VITALS — BP 116/64 | HR 84 | Temp 97.2°F | Resp 16 | Ht 72.0 in | Wt 232.0 lb

## 2021-06-26 DIAGNOSIS — R519 Headache, unspecified: Secondary | ICD-10-CM | POA: Insufficient documentation

## 2021-06-26 DIAGNOSIS — G4486 Cervicogenic headache: Secondary | ICD-10-CM

## 2021-06-26 DIAGNOSIS — M542 Cervicalgia: Secondary | ICD-10-CM | POA: Diagnosis not present

## 2021-06-26 DIAGNOSIS — L509 Urticaria, unspecified: Secondary | ICD-10-CM | POA: Insufficient documentation

## 2021-06-26 MED ORDER — MELOXICAM 15 MG PO TABS: 15.0000 mg | ORAL_TABLET | Freq: Every day | ORAL | 0 refills | Status: DC

## 2021-06-26 MED ORDER — TIZANIDINE HCL 2 MG PO TABS: 2.0000 mg | ORAL_TABLET | Freq: Three times a day (TID) | ORAL | 0 refills | Status: DC | PRN

## 2021-06-26 NOTE — Assessment & Plan Note (Signed)
Related to neck.

## 2021-06-26 NOTE — Assessment & Plan Note (Signed)
Continue fexofenadine.  Refer to immunology anD ALLERGY IF NOT IMPROVING. Patient will call.

## 2021-06-26 NOTE — Assessment & Plan Note (Signed)
Meloxicam 15 mg daily x 2 WEEKS.  Tizanidine 2 mg one three times a day as needed for neck pain. May still take tylenol.  Recommend neck exercises 2-3 times per day. If not improving, call and we will refer to physical therapy.

## 2021-06-26 NOTE — Patient Instructions (Signed)
Meloxicam 15 mg daily x 2 WEEKS.  Tizanidine 2 mg one three times a day as needed for neck pain. May still take tylenol.  Recommend neck exercises 2-3 times per day.

## 2021-06-26 NOTE — Progress Notes (Signed)
Acute Office Visit  Subjective:    Patient ID: Clarence Hill, male    DOB: 1945/03/18, 76 y.o.   MRN: [id number]  Chief Complaint  Patient presents with   Neck Pain    HPI: Patient is in today for neck pain which started 10 days ago.  Used heat and ice. Pinching pain. Massage therapy did not help. Stretching. Nothing has helped. Radiates on the right side of neck into base of head. Tried Tylenol pm.  Complaining of hives on abdomen. Saw derm and recommended fexofenadine. It helps, but it comes back. Has been going on a month.   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   Left lower quadrant pain 01/26/2020   Nodule of finger of left hand 01/14/2018   Palpitations 05/12/2017   Proud flesh 09/15/2019   Retinal edema 02/09/2019   S/P cataract extraction and insertion of intraocular lens, left 02/09/2019   Thrombosed external hemorrhoid 12/18/2017   Vitreous debris 02/09/2019    Past Surgical History:  Procedure Laterality Date   APPENDECTOMY     CARDIAC CATHETERIZATION     CATARACT EXTRACTION     EYE SURGERY Right    Laser    Family History  Problem Relation Age of Onset   Thyroid cancer Mother    CAD Father    Breast cancer Sister    Renal cancer Sister    CAD Paternal Uncle     Social History   Socioeconomic History   Marital status: Married    Spouse name: Not on file   Number of children: 2   Years of education: Not on file   Highest education level: Not on file  Occupational History   Not on file  Tobacco Use   Smoking status: Former   Smokeless  tobacco: Never  Vaping Use   Vaping Use: Never used  Substance and Sexual Activity   Alcohol use: Yes    Alcohol/week: 5.0 standard drinks    Types: 5 Glasses of wine per week   Drug use: Never   Sexual activity: Not on file  Other Topics Concern   Not on file  Social History Narrative   Not on file   Social Determinants of Health   Financial Resource Strain: Not on file  Food Insecurity: Not on file  Transportation Needs: Not on file  Physical Activity: Not on file  Stress: Not on file  Social Connections: Not on file  Intimate Partner Violence: Not on file    Outpatient Medications Prior to Visit  Medication Sig Dispense Refill   allopurinol (ZYLOPRIM) 100 MG tablet Take 100 mg by mouth daily.  1   amLODipine (NORVASC) 10 MG tablet Take 10 mg by mouth daily.     aspirin EC 81 MG tablet Take 81 mg by mouth daily.     colchicine 0.6 MG tablet TAKE ONE TABLET BY MOUTH DAILY AS NEEDED DURING GOUT FLARE 30 tablet 0   fexofenadine (ALLEGRA HIVES 24HR) 180 MG tablet Take 180 mg by mouth daily.     fluticasone (FLONASE) 50 MCG/ACT nasal spray Place 2 sprays into  both nostrils daily. 16 g 6   Multiple Vitamins-Minerals (MULTIVITAMIN) LIQD Take 1 tablet by mouth daily. Unknown strength     omeprazole (PRILOSEC) 40 MG capsule Take 40 mg by mouth daily.     pravastatin (PRAVACHOL) 10 MG tablet TAKE 1 TABLET BY MOUTH AT BEDTIME 90 tablet 1   vitamin B-12 (CYANOCOBALAMIN) 1000 MCG tablet Take 2,500 mcg by mouth daily.     zolpidem (AMBIEN) 10 MG tablet TAKE 1 TABLET BY MOUTH AT BEDTIME AS NEEDED. 30 tablet 5   nystatin-triamcinolone (MYCOLOG II) cream Apply 1 application. topically 2 (two) times daily. (Patient not taking: Reported on 06/26/2021) 30 g 0   benzonatate (TESSALON) 100 MG capsule Take 1 capsule (100 mg total) by mouth 2 (two) times daily as needed for cough. 20 capsule 0   promethazine-dextromethorphan (PROMETHAZINE-DM) 6.25-15 MG/5ML syrup Take 5 mLs by mouth 4 (four) times  daily as needed. 118 mL 0   No facility-administered medications prior to visit.    Allergies  Allergen Reactions   Levofloxacin Other (See Comments)   Penicillins    Pepcid [Famotidine]     Cough   Penicillin G Other (See Comments) and Rash    Unknown Unknown   Penicillin G Benzathine & Proc Rash    Review of Systems  Constitutional:  Negative for chills and fever.  HENT:  Negative for congestion, ear pain and sore throat.   Respiratory:  Negative for shortness of breath.   Cardiovascular:  Negative for chest pain.  Musculoskeletal:  Positive for neck pain.  Neurological:  Positive for headaches. Negative for numbness.      Objective:    Physical Exam Vitals reviewed.  Constitutional:      Appearance: Normal appearance.  Neck:     Vascular: No carotid bruit.  Cardiovascular:     Rate and Rhythm: Normal rate and regular rhythm.     Heart sounds: Normal heart sounds.  Pulmonary:     Effort: Pulmonary effort is normal.     Breath sounds: Normal breath sounds. No wheezing, rhonchi or rales.  Musculoskeletal:        General: Tenderness (rt cervical paraspinal muscles. nontender over vertebrae. FROM only limitation is neck extension.) present.  Skin:    Findings: Rash (urticaria abdomen. saw photos.) present.  Neurological:     Mental Status: He is alert.  Psychiatric:        Mood and Affect: Mood normal.        Behavior: Behavior normal.    BP 116/64   Pulse 84   Temp (!) 97.2 F (36.2 C)   Resp 16   Ht 6' (1.829 m)   Wt 232 lb (105.2 kg)   BMI 31.46 kg/m  Wt Readings from Last 3 Encounters:  06/26/21 232 lb (105.2 kg)  05/24/21 231 lb (104.8 kg)  03/09/21 225 lb (102.1 kg)    Health Maintenance Due  Topic Date Due   TETANUS/TDAP  Never done   Zoster Vaccines- Shingrix (1 of 2) Never done    There are no preventive care reminders to display for this patient.   Lab Results  Component Value Date   TSH 1.380 01/01/2021   Lab Results  Component  Value Date   WBC 4.2 01/01/2021   HGB 14.9 01/01/2021   HCT 43.5 01/01/2021   MCV 90 01/01/2021   PLT 303 01/01/2021   Lab Results  Component Value Date   NA 140 01/01/2021   K 4.8 01/01/2021   CO2 24  01/01/2021   GLUCOSE 95 01/01/2021   BUN 15 01/01/2021   CREATININE 1.24 01/01/2021   BILITOT 0.7 01/01/2021   ALKPHOS 76 01/01/2021   AST 27 01/01/2021   ALT 23 01/01/2021   PROT 7.1 01/01/2021   ALBUMIN 4.5 01/01/2021   CALCIUM 9.4 01/01/2021   EGFR 61 01/01/2021   Lab Results  Component Value Date   CHOL 106 01/01/2021   Lab Results  Component Value Date   HDL 36 (L) 01/01/2021   Lab Results  Component Value Date   LDLCALC 49 01/01/2021   Lab Results  Component Value Date   TRIG 115 01/01/2021   Lab Results  Component Value Date   CHOLHDL 2.9 01/01/2021   No results found for: HGBA1C     Assessment & Plan:   Problem List Items Addressed This Visit       Musculoskeletal and Integument   Urticaria    Continue fexofenadine.  Refer to immunology anD ALLERGY IF NOT IMPROVING. Patient will call.         Other   Neck pain on right side - Primary    Meloxicam 15 mg daily x 2 WEEKS.  Tizanidine 2 mg one three times a day as needed for neck pain. May still take tylenol.  Recommend neck exercises 2-3 times per day. If not improving, call and we will refer to physical therapy.       Relevant Medications   meloxicam (MOBIC) 15 MG tablet   tiZANidine (ZANAFLEX) 2 MG tablet   Headache    Related to neck.        Relevant Medications   meloxicam (MOBIC) 15 MG tablet   tiZANidine (ZANAFLEX) 2 MG tablet   Meds ordered this encounter  Medications   meloxicam (MOBIC) 15 MG tablet    Sig: Take 1 tablet (15 mg total) by mouth daily.    Dispense:  30 tablet    Refill:  0   tiZANidine (ZANAFLEX) 2 MG tablet    Sig: Take 1 tablet (2 mg total) by mouth every 8 (eight) hours as needed for muscle spasms.    Dispense:  30 tablet    Refill:  0      Follow-up: Return if symptoms worsen or fail to improve.  An After Visit Summary was printed and given to the patient.  [name], MD [facility] [phone]

## 2021-07-09 ENCOUNTER — Ambulatory Visit: Payer: Medicare Other | Admitting: Family Medicine

## 2021-07-10 DIAGNOSIS — I472 Ventricular tachycardia, unspecified: Secondary | ICD-10-CM | POA: Diagnosis not present

## 2021-07-10 DIAGNOSIS — I471 Supraventricular tachycardia: Secondary | ICD-10-CM | POA: Diagnosis not present

## 2021-07-16 ENCOUNTER — Ambulatory Visit (INDEPENDENT_AMBULATORY_CARE_PROVIDER_SITE_OTHER): Payer: Medicare Other | Admitting: Family Medicine

## 2021-07-16 ENCOUNTER — Encounter: Payer: Self-pay | Admitting: Family Medicine

## 2021-07-16 VITALS — BP 136/72 | HR 74 | Temp 97.5°F | Resp 16 | Ht 72.0 in | Wt 233.0 lb

## 2021-07-16 DIAGNOSIS — I10 Essential (primary) hypertension: Secondary | ICD-10-CM | POA: Diagnosis not present

## 2021-07-16 DIAGNOSIS — I700 Atherosclerosis of aorta: Secondary | ICD-10-CM

## 2021-07-16 DIAGNOSIS — M1A9XX0 Chronic gout, unspecified, without tophus (tophi): Secondary | ICD-10-CM | POA: Diagnosis not present

## 2021-07-16 DIAGNOSIS — Z6831 Body mass index (BMI) 31.0-31.9, adult: Secondary | ICD-10-CM

## 2021-07-16 DIAGNOSIS — F5101 Primary insomnia: Secondary | ICD-10-CM | POA: Diagnosis not present

## 2021-07-16 DIAGNOSIS — R3912 Poor urinary stream: Secondary | ICD-10-CM

## 2021-07-16 DIAGNOSIS — K219 Gastro-esophageal reflux disease without esophagitis: Secondary | ICD-10-CM

## 2021-07-16 HISTORY — DX: Poor urinary stream: R39.12

## 2021-07-16 NOTE — Assessment & Plan Note (Signed)
The current medical regimen is effective;  continue present plan and medications.  

## 2021-07-16 NOTE — Progress Notes (Signed)
Subjective:  Patient ID: Clarence Hill, male    DOB: 20-Jul-1945  Age: 76 y.o. MRN: [id number]  Chief Complaint  Patient presents with   Hypertension   HPI Hypertension: Current Medications: Amlodipine '10mg'$  1 tablet once daily, I discontinued bystolic  after completion.  Chronic gout: Current Medications: Allopurinol '100mg'$  1 tablet daily. Has colchicine 0.'6mg'$  1 tablet prn as needed for gout flare up.  Aortic atherosclerosis/Hyperlipidemia: Current Medications: Pravastatin '10mg'$  one tablet daily, Aspirin '81mg'$  take 1 tablet daily.  GERD: Current Medications: Omeprazole '20mg'$  take 1 tablet daily. Reflux is well controlled. Trial H2 blocker failed returned to oemprazole.   Primary insomnia: Current Medications: Take Ambien 5 mg one tablet before bed prn sleep.  Palpitations: Pt is having a "hard" heart beat several times a day. NO associated sob or cp. Pt saw his cardiologist, Dr. Agustin Cree.  Pt saw Dr. Migdalia D[facility] clinic in [place]. Pt saw Dr. Charisse March, wanted for a second opinion in [place] and holtor monitor was ordered. He just got it last week, but is waiting until he returns from vacation..   Gout: on allopurinol and colchicine.   Eating healthy. Not exercising.   Current Outpatient Medications on File Prior to Visit  Medication Sig Dispense Refill   allopurinol (ZYLOPRIM) 100 MG tablet Take 100 mg by mouth daily.  1   amLODipine (NORVASC) 10 MG tablet Take 10 mg by mouth daily.     aspirin EC 81 MG tablet Take 81 mg by mouth daily.     colchicine 0.6 MG tablet TAKE ONE TABLET BY MOUTH DAILY AS NEEDED DURING GOUT FLARE 30 tablet 0   fexofenadine (ALLEGRA HIVES 24HR) 180 MG tablet Take 180 mg by mouth daily.     Multiple Vitamins-Minerals (MULTIVITAMIN) LIQD Take 1 tablet by mouth daily. Unknown strength     omeprazole (PRILOSEC) 40 MG capsule Take 40 mg by mouth daily.     pravastatin (PRAVACHOL) 10 MG tablet TAKE 1 TABLET BY MOUTH AT BEDTIME 90 tablet 1   tiZANidine (ZANAFLEX)  2 MG tablet Take 1 tablet (2 mg total) by mouth every 8 (eight) hours as needed for muscle spasms. 30 tablet 0   vitamin B-12 (CYANOCOBALAMIN) 1000 MCG tablet Take 2,500 mcg by mouth daily.     zolpidem (AMBIEN) 10 MG tablet TAKE 1 TABLET BY MOUTH AT BEDTIME AS NEEDED. 30 tablet 5   meloxicam (MOBIC) 15 MG tablet Take 1 tablet (15 mg total) by mouth daily. (Patient not taking: Reported on 07/16/2021) 30 tablet 0   nystatin-triamcinolone (MYCOLOG II) cream Apply 1 application. topically 2 (two) times daily. (Patient not taking: Reported on 06/26/2021) 30 g 0   No current facility-administered medications on file prior to visit.   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   Left lower quadrant pain 01/26/2020   Nodule of finger of left hand 01/14/2018   Palpitations 05/12/2017   Proud flesh 09/15/2019   Retinal edema 02/09/2019   S/P cataract extraction and insertion of intraocular lens, left 02/09/2019   Thrombosed external hemorrhoid 12/18/2017   Vitreous debris 02/09/2019  Past Surgical History:  Procedure Laterality Date   APPENDECTOMY     CARDIAC CATHETERIZATION     CATARACT EXTRACTION     EYE SURGERY Right    Laser    Family History  Problem Relation Age of Onset   Thyroid cancer Mother    CAD Father    Breast cancer Sister    Renal cancer Sister    CAD Paternal Uncle    Social History   Socioeconomic History   Marital status: Married    Spouse name: Not on file   Number of children: 2   Years of education: Not on file   Highest education level: Not on  file  Occupational History   Not on file  Tobacco Use   Smoking status: Former   Smokeless tobacco: Never  Vaping Use   Vaping Use: Never used  Substance and Sexual Activity   Alcohol use: Yes    Alcohol/week: 5.0 standard drinks of alcohol    Types: 5 Glasses of wine per week   Drug use: Never   Sexual activity: Not on file  Other Topics Concern   Not on file  Social History Narrative   Not on file   Social Determinants of Health   Financial Resource Strain: Not on file  Food Insecurity: Not on file  Transportation Needs: Not on file  Physical Activity: Not on file  Stress: Not on file  Social Connections: Not on file    Review of Systems  Constitutional:  Negative for chills and fever.  HENT:  Negative for congestion, rhinorrhea and sore throat.   Respiratory:  Positive for cough (occasional.). Negative for shortness of breath.   Cardiovascular:  Negative for chest pain and palpitations.  Gastrointestinal:  Positive for constipation (intermittent.) and diarrhea (intermittent.). Negative for abdominal pain, nausea and vomiting.  Genitourinary:  Negative for dysuria and urgency.  Musculoskeletal:  Negative for arthralgias, back pain and myalgias.  Neurological:  Positive for dizziness. Negative for headaches.  Psychiatric/Behavioral:  Negative for dysphoric mood. The patient is not nervous/anxious.      Objective:  BP 136/72   Pulse 74   Temp (!) 97.5 F (36.4 C)   Resp 16   Ht 6' (1.829 m)   Wt 233 lb (105.7 kg)   BMI 31.60 kg/m      07/16/2021    7:29 AM 06/26/2021    9:42 AM 05/24/2021    8:41 AM  BP/Weight  Systolic BP 867 672 094  Diastolic BP 72 64 78  Wt. (Lbs) 233 232 231  BMI 31.6 kg/m2 31.46 kg/m2 31.33 kg/m2    Physical Exam Vitals reviewed.  Constitutional:      Appearance: Normal appearance.  Cardiovascular:     Rate and Rhythm: Normal rate and regular rhythm.     Pulses: Normal pulses.     Heart sounds: Normal heart sounds.   Pulmonary:     Effort: Pulmonary effort is normal.     Breath sounds: Normal breath sounds. No wheezing, rhonchi or rales.  Abdominal:     General: Bowel sounds are normal.     Palpations: Abdomen is soft.     Tenderness: There is no abdominal tenderness.  Neurological:     Mental Status: He is alert.  Psychiatric:        Mood and Affect: Mood normal.        Behavior: Behavior normal.   . Diabetic Foot Exam - Simple   No data filed  Lab Results  Component Value Date   WBC 4.2 01/01/2021   HGB 14.9 01/01/2021   HCT 43.5 01/01/2021   PLT 303 01/01/2021   GLUCOSE 95 01/01/2021   CHOL 106 01/01/2021   TRIG 115 01/01/2021   HDL 36 (L) 01/01/2021   LDLCALC 49 01/01/2021   ALT 23 01/01/2021   AST 27 01/01/2021   NA 140 01/01/2021   K 4.8 01/01/2021   CL 104 01/01/2021   CREATININE 1.24 01/01/2021   BUN 15 01/01/2021   CO2 24 01/01/2021   TSH 1.380 01/01/2021      Assessment & Plan:   Problem List Items Addressed This Visit       Cardiovascular and Mediastinum   Hypertension    Well controlled.  No changes to medicines.  Continue to work on eating a healthy diet and exercise.  Labs drawn today.        Relevant Orders   CBC With Diff/Platelet   Comprehensive metabolic panel   Aortic atherosclerosis (HCC)    The current medical regimen is effective;  continue present plan and medications.       Relevant Orders   Lipid panel     Digestive   Gastroesophageal reflux disease    The current medical regimen is effective;  continue present plan and medications.         Other   Chronic gout without tophus    The current medical regimen is effective;  continue present plan and medications.       Primary insomnia - Primary    The current medical regimen is effective;  continue present plan and medications.       BMI 31.0-31.9,adult    Recommend continue to work on eating healthy diet and exercise.       Weak urine stream    Check psa.      .  Orders Placed This Encounter  Procedures   CBC With Diff/Platelet   Comprehensive metabolic panel   Lipid panel     Follow-up: Return in about 3 months (around 10/16/2021) for chronic fasting.  An After Visit Summary was printed and given to the patient.  [name], MD [facility] [phone]

## 2021-07-16 NOTE — Assessment & Plan Note (Signed)
Check psa 

## 2021-07-16 NOTE — Assessment & Plan Note (Signed)
Recommend continue to work on eating healthy diet and exercise.  

## 2021-07-16 NOTE — Assessment & Plan Note (Signed)
Well controlled.  ?No changes to medicines.  ?Continue to work on eating a healthy diet and exercise.  ?Labs drawn today.  ?

## 2021-07-17 LAB — CBC WITH DIFF/PLATELET
Basophils Absolute: 0 10*3/uL (ref 0.0–0.2)
Basos: 1 %
EOS (ABSOLUTE): 0.1 10*3/uL (ref 0.0–0.4)
Eos: 4 %
Hematocrit: 44.8 % (ref 37.5–51.0)
Hemoglobin: 14.9 g/dL (ref 13.0–17.7)
Immature Grans (Abs): 0 10*3/uL (ref 0.0–0.1)
Immature Granulocytes: 0 %
Lymphocytes Absolute: 1.2 10*3/uL (ref 0.7–3.1)
Lymphs: 33 %
MCH: 30.9 pg (ref 26.6–33.0)
MCHC: 33.3 g/dL (ref 31.5–35.7)
MCV: 93 fL (ref 79–97)
Monocytes Absolute: 0.4 10*3/uL (ref 0.1–0.9)
Monocytes: 12 %
Neutrophils Absolute: 1.8 10*3/uL (ref 1.4–7.0)
Neutrophils: 50 %
Platelets: 243 10*3/uL (ref 150–450)
RBC: 4.82 x10E6/uL (ref 4.14–5.80)
RDW: 12.8 % (ref 11.6–15.4)
WBC: 3.6 10*3/uL (ref 3.4–10.8)

## 2021-07-17 LAB — COMPREHENSIVE METABOLIC PANEL
ALT: 16 IU/L (ref 0–44)
AST: 18 IU/L (ref 0–40)
Albumin/Globulin Ratio: 1.6 (ref 1.2–2.2)
Albumin: 4.4 g/dL (ref 3.7–4.7)
Alkaline Phosphatase: 70 IU/L (ref 44–121)
BUN/Creatinine Ratio: 12 (ref 10–24)
BUN: 15 mg/dL (ref 8–27)
Bilirubin Total: 0.5 mg/dL (ref 0.0–1.2)
CO2: 22 mmol/L (ref 20–29)
Calcium: 9.4 mg/dL (ref 8.6–10.2)
Chloride: 108 mmol/L — ABNORMAL HIGH (ref 96–106)
Creatinine, Ser: 1.22 mg/dL (ref 0.76–1.27)
Globulin, Total: 2.7 g/dL (ref 1.5–4.5)
Glucose: 101 mg/dL — ABNORMAL HIGH (ref 70–99)
Potassium: 4.2 mmol/L (ref 3.5–5.2)
Sodium: 146 mmol/L — ABNORMAL HIGH (ref 134–144)
Total Protein: 7.1 g/dL (ref 6.0–8.5)
eGFR: 62 mL/min/{1.73_m2} (ref >59)

## 2021-07-17 LAB — LIPID PANEL
Chol/HDL Ratio: 2.7 ratio (ref 0.0–5.0)
Cholesterol, Total: 124 mg/dL (ref 100–199)
HDL: 46 mg/dL (ref >39)
LDL Chol Calc (NIH): 55 mg/dL (ref 0–99)
Triglycerides: 131 mg/dL (ref 0–149)
VLDL Cholesterol Cal: 23 mg/dL (ref 5–40)

## 2021-07-17 LAB — CARDIOVASCULAR RISK ASSESSMENT

## 2021-07-29 ENCOUNTER — Other Ambulatory Visit: Payer: Self-pay | Admitting: Family Medicine

## 2021-07-29 DIAGNOSIS — M542 Cervicalgia: Secondary | ICD-10-CM

## 2021-08-01 DIAGNOSIS — R002 Palpitations: Secondary | ICD-10-CM | POA: Diagnosis not present

## 2021-08-14 ENCOUNTER — Telehealth: Payer: Self-pay

## 2021-08-14 ENCOUNTER — Encounter: Payer: Self-pay | Admitting: Legal Medicine

## 2021-08-14 ENCOUNTER — Ambulatory Visit (INDEPENDENT_AMBULATORY_CARE_PROVIDER_SITE_OTHER): Payer: Medicare Other | Admitting: Legal Medicine

## 2021-08-14 ENCOUNTER — Telehealth: Payer: Self-pay | Admitting: Cardiology

## 2021-08-14 VITALS — BP 116/88 | HR 87 | Temp 98.7°F | Resp 15 | Ht 72.0 in | Wt 234.0 lb

## 2021-08-14 DIAGNOSIS — N401 Enlarged prostate with lower urinary tract symptoms: Secondary | ICD-10-CM | POA: Diagnosis not present

## 2021-08-14 DIAGNOSIS — T675XXA Heat exhaustion, unspecified, initial encounter: Secondary | ICD-10-CM

## 2021-08-14 DIAGNOSIS — I10 Essential (primary) hypertension: Secondary | ICD-10-CM | POA: Diagnosis not present

## 2021-08-14 DIAGNOSIS — N400 Enlarged prostate without lower urinary tract symptoms: Secondary | ICD-10-CM

## 2021-08-14 DIAGNOSIS — R3912 Poor urinary stream: Secondary | ICD-10-CM | POA: Diagnosis not present

## 2021-08-14 HISTORY — DX: Benign prostatic hyperplasia without lower urinary tract symptoms: N40.0

## 2021-08-14 HISTORY — DX: Heat exhaustion, unspecified, initial encounter: T67.5XXA

## 2021-08-14 NOTE — Telephone Encounter (Signed)
Spoke with pt who stated that he saw his PCP this morning. His blood pressure readings were good at the time and he felt like he may have dehydration or heat exhaustion. He will see his PCP in a week for a follow up. Encouraged  pt to drink plenty of fluids, eat well and rest. He will get blood work results from his PCP tomorrow and follow up on that. He had no further questions or concerns.

## 2021-08-14 NOTE — Telephone Encounter (Signed)
Pt c/o BP issue: STAT if pt c/o blurred vision, one-sided weakness or slurred speech  1. What are your last 5 BP readings? 162/90  2. Are you having any other symptoms (ex. Dizziness, headache, blurred vision, passed out)? Dizzy, lethargic, nauseous   3. What is your BP issue? Pt concerned about BP

## 2021-08-14 NOTE — Progress Notes (Signed)
Acute Office Visit  Subjective:    Patient ID: Clarence Hill, male    DOB: 03-01-1945, 76 y.o.   MRN: [id number]  Chief Complaint  Patient presents with   Hypertension   Dizziness    JTT:SVXBL Patient is in today for Dizziness 3 days ago and his blood pressure was 140/85. Before Sunday, he was working on his yard and he did not feel good. He drank 3 liters of gatorade and it did help. Then felt better and Sunday he was dizzy in bed BP 185/90 Sunday.  He is wearing an event monitor.  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   Left lower quadrant pain 01/26/2020   Nodule of finger of left hand 01/14/2018   Palpitations 05/12/2017   Proud flesh 09/15/2019   Retinal edema 02/09/2019   S/P cataract extraction and insertion of intraocular lens, left 02/09/2019   Thrombosed external hemorrhoid 12/18/2017   Vitreous debris 02/09/2019    Past Surgical History:  Procedure Laterality Date   APPENDECTOMY     CARDIAC CATHETERIZATION     CATARACT EXTRACTION     EYE SURGERY Right    Laser    Family History  Problem Relation Age of Onset   Thyroid cancer Mother    CAD Father    Breast cancer Sister    Renal cancer Sister    CAD Paternal Uncle     Social History   Socioeconomic History   Marital status: Married    Spouse name: Not on file   Number of children: 2   Years of education: Not on file   Highest education level: Not on file  Occupational History   Not on file  Tobacco Use   Smoking status: Former   Smokeless tobacco: Never  Vaping Use    Vaping Use: Never used  Substance and Sexual Activity   Alcohol use: Yes    Alcohol/week: 5.0 standard drinks of alcohol    Types: 5 Glasses of wine per week   Drug use: Never   Sexual activity: Not on file  Other Topics Concern   Not on file  Social History Narrative   Not on file   Social Determinants of Health   Financial Resource Strain: Not on file  Food Insecurity: Not on file  Transportation Needs: Not on file  Physical Activity: Not on file  Stress: Not on file  Social Connections: Not on file  Intimate Partner Violence: Not on file    Outpatient Medications Prior to Visit  Medication Sig Dispense Refill   allopurinol (ZYLOPRIM) 100 MG tablet Take 100 mg by mouth daily.  1   amLODipine (NORVASC) 10 MG tablet Take 10 mg by mouth daily.     aspirin EC 81 MG tablet Take 81 mg by mouth daily.     colchicine 0.6 MG tablet TAKE ONE TABLET BY MOUTH DAILY AS NEEDED DURING GOUT FLARE 30 tablet 0   fexofenadine (ALLEGRA HIVES 24HR) 180 MG tablet Take 180 mg by mouth daily.     hydrocortisone (ANUSOL-HC) 2.5 % rectal cream Place rectally.  meloxicam (MOBIC) 15 MG tablet TAKE 1 TABLET (15 MG TOTAL) BY MOUTH DAILY. 90 tablet 0   Multiple Vitamins-Minerals (MULTIVITAMIN) LIQD Take 1 tablet by mouth daily. Unknown strength     nystatin-triamcinolone (MYCOLOG II) cream Apply 1 application. topically 2 (two) times daily. 30 g 0   omeprazole (PRILOSEC) 40 MG capsule Take 40 mg by mouth daily.     pravastatin (PRAVACHOL) 10 MG tablet TAKE 1 TABLET BY MOUTH AT BEDTIME 90 tablet 1   tiZANidine (ZANAFLEX) 2 MG tablet Take 1 tablet (2 mg total) by mouth every 8 (eight) hours as needed for muscle spasms. 30 tablet 0   vitamin B-12 (CYANOCOBALAMIN) 1000 MCG tablet Take 2,500 mcg by mouth daily.     zolpidem (AMBIEN) 10 MG tablet TAKE 1 TABLET BY MOUTH AT BEDTIME AS NEEDED. 30 tablet 5   No facility-administered medications prior to visit.    Allergies  Allergen Reactions   Levofloxacin  Other (See Comments)   Penicillins    Pepcid [Famotidine]     Cough   Penicillin G Other (See Comments) and Rash    Unknown Unknown   Penicillin G Benzathine & Proc Rash    Review of Systems  Constitutional:  Positive for fatigue. Negative for chills, fever and unexpected weight change.  HENT:  Negative for congestion, ear pain, sinus pain and sore throat.   Respiratory:  Negative for cough and shortness of breath.   Cardiovascular:  Negative for chest pain and palpitations.  Gastrointestinal:  Negative for abdominal pain, blood in stool, constipation, diarrhea, nausea and vomiting.  Endocrine: Negative for polydipsia.  Genitourinary:  Negative for dysuria.  Musculoskeletal:  Negative for back pain.  Skin:  Negative for rash.  Neurological:  Positive for dizziness, light-headedness and headaches.       Objective:    Physical Exam Vitals reviewed.  Constitutional:      General: He is not in acute distress.    Appearance: Normal appearance.  HENT:     Head: Normocephalic.     Right Ear: Tympanic membrane normal.     Left Ear: Tympanic membrane normal.     Nose: Nose normal.     Mouth/Throat:     Mouth: Mucous membranes are moist.     Pharynx: Oropharynx is clear.  Eyes:     Conjunctiva/sclera: Conjunctivae normal.     Pupils: Pupils are equal, round, and reactive to light.  Neck:     Comments: No bruits Cardiovascular:     Rate and Rhythm: Normal rate and regular rhythm.     Pulses: Normal pulses.     Heart sounds: Normal heart sounds. No murmur heard.    No gallop.     Comments: No vascular bruits in supraclavicular area Pulmonary:     Effort: Pulmonary effort is normal. No respiratory distress.     Breath sounds: No wheezing.  Abdominal:     General: Abdomen is flat. Bowel sounds are normal. There is no distension.     Palpations: Abdomen is soft.     Tenderness: There is no abdominal tenderness.  Musculoskeletal:        General: Normal range of motion.      Cervical back: Normal range of motion.     Right lower leg: No edema.     Left lower leg: No edema.  Skin:    General: Skin is warm.     Capillary Refill: Capillary refill takes less than 2 seconds.  Neurological:     General:  No focal deficit present.     Mental Status: He is alert and oriented to person, place, and time. Mental status is at baseline.     Gait: Gait normal.     Deep Tendon Reflexes: Reflexes normal.  Psychiatric:        Mood and Affect: Mood normal.        Thought Content: Thought content normal.     BP 116/88 Comment: right  Pulse 87   Temp 98.7 F (37.1 C)   Resp 15   Ht 6' (1.829 m)   Wt 234 lb (106.1 kg)   SpO2 97%   BMI 31.74 kg/m  BP 130/90 on left sitting Wt Readings from Last 3 Encounters:  08/14/21 234 lb (106.1 kg)  07/16/21 233 lb (105.7 kg)  06/26/21 232 lb (105.2 kg)    Health Maintenance Due  Topic Date Due   Zoster Vaccines- Shingrix (1 of 2) Never done    There are no preventive care reminders to display for this patient.   Lab Results  Component Value Date   TSH 1.380 01/01/2021   Lab Results  Component Value Date   WBC 3.6 07/16/2021   HGB 14.9 07/16/2021   HCT 44.8 07/16/2021   MCV 93 07/16/2021   PLT 243 07/16/2021   Lab Results  Component Value Date   NA 146 (H) 07/16/2021   K 4.2 07/16/2021   CO2 22 07/16/2021   GLUCOSE 101 (H) 07/16/2021   BUN 15 07/16/2021   CREATININE 1.22 07/16/2021   BILITOT 0.5 07/16/2021   ALKPHOS 70 07/16/2021   AST 18 07/16/2021   ALT 16 07/16/2021   PROT 7.1 07/16/2021   ALBUMIN 4.4 07/16/2021   CALCIUM 9.4 07/16/2021   EGFR 62 07/16/2021   Lab Results  Component Value Date   CHOL 124 07/16/2021   Lab Results  Component Value Date   HDL 46 07/16/2021   Lab Results  Component Value Date   LDLCALC 55 07/16/2021   Lab Results  Component Value Date   TRIG 131 07/16/2021   Lab Results  Component Value Date   CHOLHDL 2.7 07/16/2021   No results found for:  "HGBA1C"     Assessment & Plan:   Problem List Items Addressed This Visit       Cardiovascular and Mediastinum   Hypertension - Primary   Relevant Orders   Comprehensive metabolic panel   CBC with Differential/Platelet An individual hypertension care plan was established and reinforced today.  The patient's status was assessed using clinical findings on exam and labs or diagnostic tests. The patient's success at meeting treatment goals on disease specific evidence-based guidelines and found to be well controlled. SELF MANAGEMENT: The patient and I together assessed ways to personally work towards obtaining the recommended goals. RECOMMENDATIONS: avoid decongestants found in common cold remedies, decrease consumption of alcohol, perform routine monitoring of BP with home BP cuff, exercise, reduction of dietary salt, take medicines as prescribed, try not to miss doses and quit smoking.  Regular exercise and maintaining a healthy weight is needed.  Stress reduction may help. A CLINICAL SUMMARY including written plan identify barriers to care unique to individual due to social or financial issues.  We attempt to mutually creat solutions for individual and family understanding.      Genitourinary   BPH (benign prostatic hyperplasia)   Relevant Orders   PSA AN INDIVIDUAL CARE PLAN for BPH was established and reinforced today.  The patient's status was assessed using clinical  findings on exam, labs, and other diagnostic testing. Patient's success at meeting treatment goals based on disease specific evidence-bassed guidelines and found to be in good control. RECOMMENDATIONS include maintaining present medicines and treatment.      Other   Heat exhaustion Symptoms probably from heat exhaustion and working outside in extreme heat, encourage fluids and keep cool, recheck one week     Orders Placed This Encounter  Procedures   Comprehensive metabolic panel   CBC with Differential/Platelet    PSA     Follow-up: Return in about 1 week (around 08/21/2021).  An After Visit Summary was printed and given to the patient.  Reinaldo Meeker, MD [facility] [phone]

## 2021-08-15 LAB — CBC WITH DIFFERENTIAL/PLATELET
Basophils Absolute: 0.1 10*3/uL (ref 0.0–0.2)
Basos: 1 %
EOS (ABSOLUTE): 0.1 10*3/uL (ref 0.0–0.4)
Eos: 2 %
Hematocrit: 47.1 % (ref 37.5–51.0)
Hemoglobin: 16.2 g/dL (ref 13.0–17.7)
Immature Grans (Abs): 0 10*3/uL (ref 0.0–0.1)
Immature Granulocytes: 0 %
Lymphocytes Absolute: 1.1 10*3/uL (ref 0.7–3.1)
Lymphs: 24 %
MCH: 31.6 pg (ref 26.6–33.0)
MCHC: 34.4 g/dL (ref 31.5–35.7)
MCV: 92 fL (ref 79–97)
Monocytes Absolute: 0.5 10*3/uL (ref 0.1–0.9)
Monocytes: 12 %
Neutrophils Absolute: 2.8 10*3/uL (ref 1.4–7.0)
Neutrophils: 61 %
Platelets: 260 10*3/uL (ref 150–450)
RBC: 5.13 x10E6/uL (ref 4.14–5.80)
RDW: 12.6 % (ref 11.6–15.4)
WBC: 4.6 10*3/uL (ref 3.4–10.8)

## 2021-08-15 LAB — COMPREHENSIVE METABOLIC PANEL
ALT: 32 IU/L (ref 0–44)
AST: 29 IU/L (ref 0–40)
Albumin/Globulin Ratio: 1.8 (ref 1.2–2.2)
Albumin: 4.6 g/dL (ref 3.8–4.8)
Alkaline Phosphatase: 77 IU/L (ref 44–121)
BUN/Creatinine Ratio: 9 — ABNORMAL LOW (ref 10–24)
BUN: 11 mg/dL (ref 8–27)
Bilirubin Total: 0.6 mg/dL (ref 0.0–1.2)
CO2: 23 mmol/L (ref 20–29)
Calcium: 9.5 mg/dL (ref 8.6–10.2)
Chloride: 103 mmol/L (ref 96–106)
Creatinine, Ser: 1.22 mg/dL (ref 0.76–1.27)
Globulin, Total: 2.6 g/dL (ref 1.5–4.5)
Glucose: 105 mg/dL — ABNORMAL HIGH (ref 70–99)
Potassium: 4.7 mmol/L (ref 3.5–5.2)
Sodium: 141 mmol/L (ref 134–144)
Total Protein: 7.2 g/dL (ref 6.0–8.5)
eGFR: 62 mL/min/{1.73_m2} (ref >59)

## 2021-08-15 LAB — PSA: Prostate Specific Ag, Serum: 1.3 ng/mL (ref 0.0–4.0)

## 2021-08-15 NOTE — Progress Notes (Signed)
Glucose 105, kidney and liver tests normal, potassium 4.7 normal.  Keep on fluids lp

## 2021-08-22 ENCOUNTER — Other Ambulatory Visit: Payer: Self-pay | Admitting: Family Medicine

## 2021-08-22 DIAGNOSIS — I472 Ventricular tachycardia, unspecified: Secondary | ICD-10-CM | POA: Diagnosis not present

## 2021-08-22 DIAGNOSIS — I471 Supraventricular tachycardia: Secondary | ICD-10-CM | POA: Diagnosis not present

## 2021-08-27 ENCOUNTER — Other Ambulatory Visit: Payer: Self-pay

## 2021-08-27 DIAGNOSIS — M1A9XX0 Chronic gout, unspecified, without tophus (tophi): Secondary | ICD-10-CM

## 2021-08-27 MED ORDER — COLCHICINE 0.6 MG PO TABS: ORAL_TABLET | ORAL | 0 refills | Status: DC

## 2021-09-10 ENCOUNTER — Other Ambulatory Visit: Payer: Self-pay

## 2021-09-10 DIAGNOSIS — R051 Acute cough: Secondary | ICD-10-CM

## 2021-09-10 DIAGNOSIS — J018 Other acute sinusitis: Secondary | ICD-10-CM

## 2021-09-10 MED ORDER — PROMETHAZINE-DM 6.25-15 MG/5ML PO SYRP: 5.0000 mL | ORAL_SOLUTION | Freq: Four times a day (QID) | ORAL | 0 refills | Status: DC | PRN

## 2021-09-10 MED ORDER — BENZONATATE 100 MG PO CAPS: 100.0000 mg | ORAL_CAPSULE | Freq: Two times a day (BID) | ORAL | 0 refills | Status: DC | PRN

## 2021-09-10 MED ORDER — ALLOPURINOL 100 MG PO TABS: 100.0000 mg | ORAL_TABLET | Freq: Every day | ORAL | 2 refills | Status: DC

## 2021-09-30 DIAGNOSIS — E785 Hyperlipidemia, unspecified: Secondary | ICD-10-CM | POA: Diagnosis not present

## 2021-09-30 DIAGNOSIS — D3502 Benign neoplasm of left adrenal gland: Secondary | ICD-10-CM | POA: Diagnosis not present

## 2021-09-30 DIAGNOSIS — R0789 Other chest pain: Secondary | ICD-10-CM | POA: Diagnosis not present

## 2021-09-30 DIAGNOSIS — M109 Gout, unspecified: Secondary | ICD-10-CM | POA: Diagnosis not present

## 2021-09-30 DIAGNOSIS — E669 Obesity, unspecified: Secondary | ICD-10-CM | POA: Diagnosis not present

## 2021-09-30 DIAGNOSIS — G4700 Insomnia, unspecified: Secondary | ICD-10-CM | POA: Diagnosis not present

## 2021-09-30 DIAGNOSIS — K219 Gastro-esophageal reflux disease without esophagitis: Secondary | ICD-10-CM | POA: Diagnosis not present

## 2021-09-30 DIAGNOSIS — R079 Chest pain, unspecified: Secondary | ICD-10-CM | POA: Diagnosis not present

## 2021-09-30 DIAGNOSIS — I700 Atherosclerosis of aorta: Secondary | ICD-10-CM | POA: Diagnosis not present

## 2021-09-30 DIAGNOSIS — D175 Benign lipomatous neoplasm of intra-abdominal organs: Secondary | ICD-10-CM | POA: Diagnosis not present

## 2021-09-30 DIAGNOSIS — Z87891 Personal history of nicotine dependence: Secondary | ICD-10-CM | POA: Diagnosis not present

## 2021-09-30 DIAGNOSIS — J9811 Atelectasis: Secondary | ICD-10-CM | POA: Diagnosis not present

## 2021-09-30 DIAGNOSIS — Z79899 Other long term (current) drug therapy: Secondary | ICD-10-CM | POA: Diagnosis not present

## 2021-09-30 DIAGNOSIS — I10 Essential (primary) hypertension: Secondary | ICD-10-CM | POA: Diagnosis not present

## 2021-09-30 DIAGNOSIS — I440 Atrioventricular block, first degree: Secondary | ICD-10-CM | POA: Diagnosis not present

## 2021-10-02 ENCOUNTER — Telehealth: Payer: Self-pay

## 2021-10-02 ENCOUNTER — Telehealth: Payer: Self-pay | Admitting: Cardiology

## 2021-10-02 DIAGNOSIS — R0789 Other chest pain: Secondary | ICD-10-CM

## 2021-10-02 NOTE — Telephone Encounter (Signed)
Please advise if you would like this patient to have a stress test and where you would like to have it done.

## 2021-10-02 NOTE — Telephone Encounter (Signed)
Pt c/o of Chest Pain: STAT if CP now or developed within 24 hours  1. Are you having CP right now? No, this past Saturday and Sunday   2. Are you experiencing any other symptoms (ex. SOB, nausea, vomiting, sweating)? no  3. How long have you been experiencing CP? Just for 2 days   4. Is your CP continuous or coming and going? N/a   5. Have you taken Nitroglycerin? N/a   Pt was seen in [facility] due to chest pain. They suggested that he get a stress test, he said that they were going to do it at the hospital but they were closed on Monday. Please advise.  ?

## 2021-10-02 NOTE — Telephone Encounter (Signed)
Called patient and he reported that this past Saturday night he was having chest pressure "like a ton of bricks was sitting on my chest". He went to [facility] ER and, per the patient, had an EKG, X-ray, enzyme test, CT and pain meds. He was discharged after his chest pressure was relieved on Sunday morning. He was advised that he needed a stress test but since it was Sunday and Monday was a holiday, that he would have to be admitted and wait in the hospital until Tuesday or he could go home and rest and come back on Tuesday and have his stress test after he got an order from his cardiologist. He chose to go home and called his cardiologist today and get an order for a stress test. Patient has no chest pressure, SOB or any other symptom at this time and he is currently NPO.

## 2021-10-02 NOTE — Telephone Encounter (Signed)
Spoke with pt. He stated that he was not able to walk on the treadmill. Per Dr. Agustin Cree will order a Lexi scan.

## 2021-10-02 NOTE — Telephone Encounter (Signed)
Called patient and informed him that he could go ahead and eat and we would schedule the stress test for another day.

## 2021-10-02 NOTE — Telephone Encounter (Signed)
Pt is calling back to discuss the stress test. He wants to know if he can eat or drink today or if he should wait. Was hoping he could have the stress test today.

## 2021-10-03 ENCOUNTER — Telehealth (HOSPITAL_COMMUNITY): Payer: Self-pay | Admitting: *Deleted

## 2021-10-03 NOTE — Addendum Note (Signed)
Addended by: Jacobo Forest D on: 10/03/2021 09:51 AM   Modules accepted: Orders

## 2021-10-03 NOTE — Addendum Note (Signed)
Addended by: Jenne Campus on: 10/03/2021 11:58 AM   Modules accepted: Orders

## 2021-10-03 NOTE — Telephone Encounter (Signed)
Patient given detailed instructions per Myocardial Perfusion Study Information Sheet for the test on 10/04/2021 at 8:15. Patient notified to arrive 15 minutes early and that it is imperative to arrive on time for appointment to keep from having the test rescheduled.  If you need to cancel or reschedule your appointment, please call the office within 24 hours of your appointment. . Patient verbalized understanding.Clarence Hill

## 2021-10-04 ENCOUNTER — Ambulatory Visit: Payer: Medicare Other | Attending: Cardiology

## 2021-10-04 ENCOUNTER — Encounter: Payer: Self-pay | Admitting: Cardiology

## 2021-10-04 DIAGNOSIS — R0789 Other chest pain: Secondary | ICD-10-CM | POA: Diagnosis not present

## 2021-10-04 MED ORDER — TECHNETIUM TC 99M TETROFOSMIN IV KIT
31.8000 | PACK | Freq: Once | INTRAVENOUS | Status: AC | PRN
  Administered 2021-10-04: 31.8 via INTRAVENOUS

## 2021-10-04 MED ORDER — REGADENOSON 0.4 MG/5ML IV SOLN
0.4000 mg | Freq: Once | INTRAVENOUS | Status: AC
  Administered 2021-10-04: 0.4 mg via INTRAVENOUS

## 2021-10-04 MED ORDER — TECHNETIUM TC 99M TETROFOSMIN IV KIT
10.2000 | PACK | Freq: Once | INTRAVENOUS | Status: AC | PRN
  Administered 2021-10-04: 10.2 via INTRAVENOUS

## 2021-10-04 NOTE — Telephone Encounter (Signed)
Error

## 2021-10-05 ENCOUNTER — Other Ambulatory Visit: Payer: Self-pay

## 2021-10-05 ENCOUNTER — Telehealth: Payer: Self-pay | Admitting: Cardiology

## 2021-10-05 LAB — MYOCARDIAL PERFUSION IMAGING
LV dias vol: 142 mL (ref 62–150)
LV sys vol: 62 mL
Nuc Stress EF: 56 %
Peak HR: 76 {beats}/min
Rest HR: 54 {beats}/min
Rest Nuclear Isotope Dose: 10.2 mCi
SDS: 0
SRS: 5
SSS: 5
Stress Nuclear Isotope Dose: 31.8 mCi
TID: 1.11

## 2021-10-05 NOTE — Telephone Encounter (Signed)
I called and LM, patient is schedule on Monday to discuss med management per his request. I left on voicemail if anything else is needed he can call us otherwise Dr. Agustin Cree will review discuss in more detail his medicine on Monday.

## 2021-10-05 NOTE — Telephone Encounter (Signed)
Patient states last week when he ran out of Amlodipine he started back taking Metoprolol in place of it. He would like to clarify which medication he should be taking. Is it okay for him to continue on Metoprolol instead of Amlodipine?

## 2021-10-05 NOTE — Telephone Encounter (Signed)
Pt c/o medication issue:  1. Name of Medication:  amLODipine (NORVASC) 10 MG tablet  2. How are you currently taking this medication (dosage and times per day)?   3. Are you having a reaction (difficulty breathing--STAT)?   4. What is your medication issue?   Patient states last week when he ran out of Amlodipine he started back taking Metoprolol in place of it. He would like to clarify which medication he should be taking. Okay to continue on Metoprolol instead of Amlodipine? Please advise.

## 2021-10-06 DIAGNOSIS — R070 Pain in throat: Secondary | ICD-10-CM | POA: Diagnosis not present

## 2021-10-08 ENCOUNTER — Ambulatory Visit: Payer: Medicare Other | Attending: Cardiology | Admitting: Cardiology

## 2021-10-08 ENCOUNTER — Other Ambulatory Visit: Payer: Self-pay

## 2021-10-08 ENCOUNTER — Other Ambulatory Visit (HOSPITAL_COMMUNITY): Payer: Self-pay

## 2021-10-08 ENCOUNTER — Encounter: Payer: Self-pay | Admitting: Cardiology

## 2021-10-08 VITALS — BP 136/84 | HR 80 | Ht 72.0 in | Wt 232.0 lb

## 2021-10-08 DIAGNOSIS — I700 Atherosclerosis of aorta: Secondary | ICD-10-CM

## 2021-10-08 DIAGNOSIS — R0789 Other chest pain: Secondary | ICD-10-CM | POA: Diagnosis not present

## 2021-10-08 DIAGNOSIS — K219 Gastro-esophageal reflux disease without esophagitis: Secondary | ICD-10-CM

## 2021-10-08 DIAGNOSIS — I10 Essential (primary) hypertension: Secondary | ICD-10-CM

## 2021-10-08 DIAGNOSIS — R002 Palpitations: Secondary | ICD-10-CM | POA: Diagnosis not present

## 2021-10-08 MED ORDER — AMLODIPINE BESYLATE 10 MG PO TABS: 10.0000 mg | ORAL_TABLET | Freq: Every day | ORAL | 3 refills | Status: DC

## 2021-10-08 NOTE — Progress Notes (Signed)
Cardiology Office Note:    Date:  10/08/2021   ID:  [name], DOB 09/03/1945, MRN [id number]  PCP:  [name], MD  Cardiologist:  Jenne Campus, MD    Referring MD: [name], MD   Chief Complaint  Patient presents with   Follow-up    History of Present Illness:    Clarence Hill is a 76 y.o. male with past medical history significant for palpitations, PVCs, essential hypertension, dizziness, atypical chest pain, enlargement of the aorta however latest CT did not show significant enlargement.  Recently he ended up being in the hospital with prompted visit is the fact that he was trying to go asleep and develop Chest pain.  He described this as a pressure heavy like sensation.  That sensation lasting few hours.  Eventually he end up going to the emergency room in the emergency room CT of the chest which rule out PE rule out dissection, his biochemical markers were normal, he eventually was discharged home and then later he did have a stress test done in our hospital which is negative basically have no good explanation from cardiac standpoint reviewed to have his symptomatology.  I am more than suspect he may have some GI issues.  He blame his symptoms of metoprolol apparently he was not taking metoprolol then stopped suddenly started taking 50 mg daily and developed his symptomatology.  Now he cut down and seems to be doing quite well.  Since the time of discharge from hospital he is able to walk climb stairs working the garden with no difficulties.  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   amLODipine (NORVASC) 10 MG tablet Take 10 mg by mouth daily.   aspirin EC 81 MG tablet Take 81 mg by mouth daily.   benzonatate (TESSALON) 100 MG capsule Take 1 capsule (100 mg total) by mouth 2 (two) times daily as needed for cough.   colchicine 0.6 MG tablet TAKE ONE TABLET BY MOUTH DAILY AS NEEDED DURING GOUT FLARE (Patient taking differently: Take 0.6 mg by mouth daily as needed (Gout flare). TAKE ONE TABLET BY MOUTH DAILY AS NEEDED DURING GOUT FLARE)   fexofenadine (ALLEGRA HIVES 24HR) 180 MG tablet Take 180 mg by mouth daily.   hydrocortisone (ANUSOL-HC) 2.5 % rectal cream Place 1 Application rectally as needed for hemorrhoids.   Multiple Vitamins-Minerals (MULTIVITAMIN) LIQD Take 1 tablet by mouth daily. Unknown strength  nystatin-triamcinolone (MYCOLOG II) cream Apply 1 application. topically 2 (two) times daily.   omeprazole (PRILOSEC) 40 MG capsule Take 40 mg by mouth daily.   pravastatin (PRAVACHOL) 10 MG tablet TAKE 1 TABLET BY MOUTH AT BEDTIME (Patient taking differently: Take 10 mg by mouth daily.)   promethazine-dextromethorphan (PROMETHAZINE-DM) 6.25-15 MG/5ML syrup Take 5 mLs by mouth 4 (four) times daily as needed. (Patient taking differently: Take 5 mLs by mouth 4 (four) times daily as needed for cough.)   tiZANidine (ZANAFLEX) 2 MG  tablet Take 1 tablet (2 mg total) by mouth every 8 (eight) hours as needed for muscle spasms.   vitamin B-12 (CYANOCOBALAMIN) 1000 MCG tablet Take 2,500 mcg by mouth daily.   zolpidem (AMBIEN) 10 MG tablet TAKE 1 TABLET BY MOUTH EVERY DAY AT BEDTIME AS NEEDED (Patient taking differently: Take 10 mg by mouth at bedtime as needed for sleep.)     Allergies:   Levofloxacin, Penicillins, Pepcid [famotidine], Penicillin g, and Penicillin g benzathine & proc   Social History   Socioeconomic History   Marital status: Married    Spouse name: Not on file   Number of children: 2   Years of education: Not on file   Highest education level: Not on file  Occupational History   Not on file  Tobacco Use   Smoking status: Former   Smokeless tobacco: Never  Vaping Use   Vaping Use: Never used  Substance and Sexual Activity   Alcohol use: Yes    Alcohol/week: 5.0 standard drinks of alcohol    Types: 5 Glasses of wine per week   Drug use: Never   Sexual activity: Not on file  Other Topics Concern   Not on file  Social History Narrative   Not on file   Social Determinants of Health   Financial Resource Strain: Not on file  Food Insecurity: Not on file  Transportation Needs: Not on file  Physical Activity: Not on file  Stress: Not on file  Social Connections: Not on file     Family History: The patient's family history includes Breast cancer in his sister; CAD in his father and paternal uncle; Renal cancer in his sister; Thyroid cancer in his mother. ROS:   Please see the history of present illness.    All 14 point review of systems negative except as described per history of present illness  EKGs/Labs/Other Studies Reviewed:      Recent Labs: 01/01/2021: TSH 1.380 08/14/2021: ALT 32; BUN 11; Creatinine, Ser 1.22; Hemoglobin 16.2; Platelets 260; Potassium 4.7; Sodium 141  Recent Lipid Panel    Component Value Date/Time   CHOL 124 07/16/2021 1431   TRIG 131 07/16/2021 1431   HDL  46 07/16/2021 1431   CHOLHDL 2.7 07/16/2021 1431   LDLCALC 55 07/16/2021 1431    Physical Exam:    VS:  BP 136/84 (BP Location: Left Arm, Patient Position: Sitting)   Pulse 80   Ht 6' (1.829 m)   Wt 232 lb (105.2 kg)   SpO2 96%   BMI 31.46 kg/m     Wt Readings from Last 3 Encounters:  10/08/21 232 lb (105.2 kg)  10/04/21 234 lb (106.1 kg)  08/14/21 234 lb (106.1 kg)     GEN:  Well nourished, well developed in no acute distress HEENT: Normal NECK: No JVD; No carotid bruits LYMPHATICS: No lymphadenopathy CARDIAC: RRR, no murmurs, no rubs, no gallops RESPIRATORY:  Clear to auscultation without rales, wheezing or rhonchi  ABDOMEN: Soft, non-tender, non-distended  MUSCULOSKELETAL:  No edema; No deformity  SKIN: Warm and dry LOWER EXTREMITIES: no swelling NEUROLOGIC:  Alert and oriented x 3 PSYCHIATRIC:  Normal affect   ASSESSMENT:    1. Aortic atherosclerosis ([place])   2. Primary hypertension   3. Gastroesophageal reflux disease without esophagitis   4. Atypical chest pain   5. Palpitations    PLAN:    In order of problems listed above:  Aortic atherosclerosis.  Highly recommend to start antiplatelets therapy which he agree and will continue  Hypertension I will discontinue his metoprolol and put him on amlodipine 10 mg daily Dyslipidemia I did review his K PN and he LDL is 55 HDL 46 he is on a small dose of pravastatin only 10 mg daily seems to be doing good job with that. Palpitations denies having any Did review record from hospital for this visit   Medication Adjustments/Labs and Tests Ordered: Current medicines are reviewed at length with the patient today.  Concerns regarding medicines are outlined above.  No orders of the defined types were placed in this encounter.  Medication changes: No orders of the defined types were placed in this encounter.   Signed, Park Liter, MD, [facility] 10/08/2021 2:51 PM    [place]

## 2021-10-08 NOTE — Patient Instructions (Signed)
Medication Instructions:  Your physician has recommended you make the following change in your medication:  STOP: Metoprolol  START: Amlodipine '10mg'$  1 tablet daily by mouth    Lab Work: None Ordered If you have labs (blood work) drawn today and your tests are completely normal, you will receive your results only by: [place] (if you have MyChart) OR A paper copy in the mail If you have any lab test that is abnormal or we need to change your treatment, we will call you to review the results.   Testing/Procedures: None Ordered   Follow-Up: At [facility], you and your health needs are our priority.  As part of our continuing mission to provide you with exceptional heart care, we have created designated Provider Care Teams.  These Care Teams include your primary Cardiologist (physician) and Advanced Practice Providers (APPs -  Physician Assistants and Nurse Practitioners) who all work together to provide you with the care you need, when you need it.  We recommend signing up for the patient portal called "MyChart".  Sign up information is provided on this After Visit Summary.  MyChart is used to connect with patients for Virtual Visits (Telemedicine).  Patients are able to view lab/test results, encounter notes, upcoming appointments, etc.  Non-urgent messages can be sent to your provider as well.   To learn more about what you can do with MyChart, go to [email or web address].    Your next appointment:   6 month(s)  The format for your next appointment:   In Person  Provider:   Jenne Campus, MD    Other Instructions NA

## 2021-10-08 NOTE — Addendum Note (Signed)
Addended by: Jacobo Forest D on: 10/08/2021 03:00 PM   Modules accepted: Orders

## 2021-10-09 NOTE — Addendum Note (Signed)
Addended by: Truddie Hidden on: 10/09/2021 08:05 AM   Modules accepted: Orders

## 2021-10-13 DIAGNOSIS — R519 Headache, unspecified: Secondary | ICD-10-CM | POA: Diagnosis not present

## 2021-10-13 DIAGNOSIS — R079 Chest pain, unspecified: Secondary | ICD-10-CM | POA: Diagnosis not present

## 2021-10-13 DIAGNOSIS — R2990 Unspecified symptoms and signs involving the nervous system: Secondary | ICD-10-CM | POA: Diagnosis not present

## 2021-10-13 DIAGNOSIS — I160 Hypertensive urgency: Secondary | ICD-10-CM | POA: Diagnosis not present

## 2021-10-13 DIAGNOSIS — H538 Other visual disturbances: Secondary | ICD-10-CM | POA: Diagnosis not present

## 2021-10-14 DIAGNOSIS — F419 Anxiety disorder, unspecified: Secondary | ICD-10-CM | POA: Diagnosis not present

## 2021-10-14 DIAGNOSIS — Z79899 Other long term (current) drug therapy: Secondary | ICD-10-CM | POA: Diagnosis not present

## 2021-10-14 DIAGNOSIS — Z87442 Personal history of urinary calculi: Secondary | ICD-10-CM | POA: Diagnosis not present

## 2021-10-14 DIAGNOSIS — I351 Nonrheumatic aortic (valve) insufficiency: Secondary | ICD-10-CM | POA: Diagnosis not present

## 2021-10-14 DIAGNOSIS — N179 Acute kidney failure, unspecified: Secondary | ICD-10-CM | POA: Diagnosis not present

## 2021-10-14 DIAGNOSIS — M1990 Unspecified osteoarthritis, unspecified site: Secondary | ICD-10-CM | POA: Diagnosis not present

## 2021-10-14 DIAGNOSIS — H532 Diplopia: Secondary | ICD-10-CM | POA: Diagnosis not present

## 2021-10-14 DIAGNOSIS — I129 Hypertensive chronic kidney disease with stage 1 through stage 4 chronic kidney disease, or unspecified chronic kidney disease: Secondary | ICD-10-CM | POA: Diagnosis not present

## 2021-10-14 DIAGNOSIS — F32A Depression, unspecified: Secondary | ICD-10-CM | POA: Diagnosis not present

## 2021-10-14 DIAGNOSIS — G459 Transient cerebral ischemic attack, unspecified: Secondary | ICD-10-CM | POA: Diagnosis not present

## 2021-10-14 DIAGNOSIS — Z87891 Personal history of nicotine dependence: Secondary | ICD-10-CM | POA: Diagnosis not present

## 2021-10-14 DIAGNOSIS — R2990 Unspecified symptoms and signs involving the nervous system: Secondary | ICD-10-CM | POA: Diagnosis not present

## 2021-10-14 DIAGNOSIS — R519 Headache, unspecified: Secondary | ICD-10-CM | POA: Diagnosis not present

## 2021-10-14 DIAGNOSIS — R079 Chest pain, unspecified: Secondary | ICD-10-CM | POA: Diagnosis not present

## 2021-10-14 DIAGNOSIS — I160 Hypertensive urgency: Secondary | ICD-10-CM | POA: Diagnosis not present

## 2021-10-14 DIAGNOSIS — N182 Chronic kidney disease, stage 2 (mild): Secondary | ICD-10-CM | POA: Diagnosis not present

## 2021-10-14 DIAGNOSIS — H538 Other visual disturbances: Secondary | ICD-10-CM | POA: Diagnosis not present

## 2021-10-14 DIAGNOSIS — Z880 Allergy status to penicillin: Secondary | ICD-10-CM | POA: Diagnosis not present

## 2021-10-14 DIAGNOSIS — Z888 Allergy status to other drugs, medicaments and biological substances status: Secondary | ICD-10-CM | POA: Diagnosis not present

## 2021-10-14 DIAGNOSIS — I340 Nonrheumatic mitral (valve) insufficiency: Secondary | ICD-10-CM | POA: Diagnosis not present

## 2021-10-15 ENCOUNTER — Encounter: Payer: Self-pay | Admitting: Cardiology

## 2021-10-15 ENCOUNTER — Encounter: Payer: Self-pay | Admitting: Family Medicine

## 2021-10-15 ENCOUNTER — Telehealth: Payer: Self-pay | Admitting: Cardiology

## 2021-10-15 ENCOUNTER — Telehealth: Payer: Self-pay

## 2021-10-15 DIAGNOSIS — H532 Diplopia: Secondary | ICD-10-CM

## 2021-10-15 NOTE — Telephone Encounter (Signed)
Patient called to say that he is in the hospital and wanted to make the dr aware. Please advise

## 2021-10-15 NOTE — Telephone Encounter (Signed)
Clarence Hill called for follow-up of [facility] Hospitalization.  He had a thorough work-up for double vision and elevated bp.  He was discharged before receiving his MRI results and he needs referral to Neurology.  Records obtained and Dr. Tobie Poet reviewed.  Patient is currently on plavix and aspirin as ordered.  Referral to Neurology sent.  Follow-up appointment scheduled with Dr. Tobie Poet next Monday.

## 2021-10-16 NOTE — Telephone Encounter (Signed)
Appt made

## 2021-10-18 ENCOUNTER — Ambulatory Visit (INDEPENDENT_AMBULATORY_CARE_PROVIDER_SITE_OTHER): Payer: Medicare Other

## 2021-10-18 ENCOUNTER — Encounter: Payer: Self-pay | Admitting: Cardiology

## 2021-10-18 ENCOUNTER — Ambulatory Visit: Payer: Medicare Other | Attending: Cardiology | Admitting: Cardiology

## 2021-10-18 VITALS — BP 130/84 | HR 92 | Ht 72.0 in | Wt 229.0 lb

## 2021-10-18 DIAGNOSIS — R002 Palpitations: Secondary | ICD-10-CM | POA: Diagnosis not present

## 2021-10-18 DIAGNOSIS — I700 Atherosclerosis of aorta: Secondary | ICD-10-CM | POA: Insufficient documentation

## 2021-10-18 DIAGNOSIS — I10 Essential (primary) hypertension: Secondary | ICD-10-CM | POA: Diagnosis not present

## 2021-10-18 DIAGNOSIS — G459 Transient cerebral ischemic attack, unspecified: Secondary | ICD-10-CM | POA: Diagnosis not present

## 2021-10-18 DIAGNOSIS — R0789 Other chest pain: Secondary | ICD-10-CM | POA: Diagnosis not present

## 2021-10-18 MED ORDER — PANTOPRAZOLE SODIUM 40 MG PO TBEC: 40.0000 mg | DELAYED_RELEASE_TABLET | Freq: Every day | ORAL | 3 refills | Status: DC

## 2021-10-18 NOTE — Patient Instructions (Addendum)
Medication Instructions:  Your physician has recommended you make the following change in your medication:   Stop omeprazole  Start Protonix 40 mg tablet daily.   *If you need a refill on your cardiac medications before your next appointment, please call your pharmacy*   Lab Work: None ordered If you have labs (blood work) drawn today and your tests are completely normal, you will receive your results only by: [place] (if you have MyChart) OR A paper copy in the mail If you have any lab test that is abnormal or we need to change your treatment, we will call you to review the results.   Testing/Procedures:  WHY IS MY DOCTOR PRESCRIBING ZIO? The Zio system is proven and trusted by physicians to detect and diagnose irregular heart rhythms -- and has been prescribed to hundreds of thousands of patients.  The FDA has cleared the Zio system to monitor for many different kinds of irregular heart rhythms. In a study, physicians were able to reach a diagnosis 90% of the time with the Zio system1.  You can wear the Zio monitor -- a small, discreet, comfortable patch -- during your normal day-to-day activity, including while you sleep, shower, and exercise, while it records every single heartbeat for analysis.  1Barrett, P., et al. Comparison of 24 Hour Holter Monitoring Versus 14 Day Novel Adhesive Patch Electrocardiographic Monitoring. [place], 2014.  ZIO VS. HOLTER MONITORING The Zio monitor can be comfortably worn for up to 14 days. Holter monitors can be worn for 24 to 48 hours, limiting the time to record any irregular heart rhythms you may have. Zio is able to capture data for the 51% of patients who have their first symptom-triggered arrhythmia after 48 hours.1  LIVE WITHOUT RESTRICTIONS The Zio ambulatory cardiac monitor is a small, unobtrusive, and water-resistant patch--you might even forget you're wearing it. The Zio monitor records and stores every  beat of your heart, whether you're sleeping, working out, or showering. Wear the monitor for 14 days, remove 11/01/21.   Follow-Up: At [facility], you and your health needs are our priority.  As part of our continuing mission to provide you with exceptional heart care, we have created designated Provider Care Teams.  These Care Teams include your primary Cardiologist (physician) and Advanced Practice Providers (APPs -  Physician Assistants and Nurse Practitioners) who all work together to provide you with the care you need, when you need it.  We recommend signing up for the patient portal called "MyChart".  Sign up information is provided on this After Visit Summary.  MyChart is used to connect with patients for Virtual Visits (Telemedicine).  Patients are able to view lab/test results, encounter notes, upcoming appointments, etc.  Non-urgent messages can be sent to your provider as well.   To learn more about what you can do with MyChart, go to [email or web address].    Your next appointment:   As scheduled  The format for your next appointment:   In Person  Provider:   Jenne Campus, MD   Other Instructions NA

## 2021-10-18 NOTE — Progress Notes (Signed)
Cardiology Office Note:    Date:  10/18/2021   ID:  Clarence Hill, DOB August 01, 1945, MRN [id number]  PCP:  [name], MD  Cardiologist:  Jenne Campus, MD    Referring MD: [name], MD   Chief Complaint  Patient presents with   Hospitalization Follow-up    History of Present Illness:    Clarence Hill is a 76 y.o. male with past history significant for palpitations, PVCs, essential hypertension, dizziness, atypical chest pain, suspicion for enlargement of the aorta however less likely CT did not confirm that, he recently ended up being in the hospital last week he was driving a car and then suddenly started having episode of double vision that sensation lasted for about 30 seconds.  Then everything subsided he still did not feel well have some weird strength sensation came home waited some time eventually end up going to the emergency room.  In the emergency room he was evaluated by neurology, conclusion has been made that this is most likely TIA.  He was put on dual antiplatelet therapy in form of aspirin and Plavix and he was referred back to [name].  Since that time he is doing fine.  He denies have any chest pain tightness squeezing pressure burning chest.  No more double vision no more neurological problems.  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   amLODipine (NORVASC) 10 MG tablet Take 1 tablet (10 mg total) by mouth daily.   aspirin EC 81 MG tablet Take 81 mg by mouth daily.   benzonatate (TESSALON) 100 MG capsule Take 1 capsule (100 mg total) by mouth 2 (two) times daily as needed for cough.   clopidogrel (PLAVIX) 75 MG tablet Take 75 mg by mouth daily.   colchicine 0.6 MG tablet TAKE ONE TABLET BY MOUTH DAILY AS NEEDED DURING GOUT FLARE   fexofenadine (ALLEGRA HIVES 24HR) 180 MG tablet Take 180 mg by mouth daily.   hydrocortisone (ANUSOL-HC) 2.5 % rectal cream Place 1 Application rectally as needed for hemorrhoids.   Multiple Vitamins-Minerals (MULTIVITAMIN) LIQD Take 1 tablet by mouth daily. Unknown strength   nystatin-triamcinolone (MYCOLOG II) cream Apply 1 application. topically 2 (two) times daily.   pantoprazole (PROTONIX) 40 MG tablet Take 1 tablet (40 mg total) by mouth daily.   pravastatin (PRAVACHOL) 10 MG tablet TAKE 1 TABLET BY MOUTH AT BEDTIME   tiZANidine (ZANAFLEX) 2 MG tablet Take 1  tablet (2 mg total) by mouth every 8 (eight) hours as needed for muscle spasms.   vitamin B-12 (CYANOCOBALAMIN) 1000 MCG tablet Take 2,500 mcg by mouth daily.   zolpidem (AMBIEN) 10 MG tablet Take 10 mg by mouth at bedtime as needed for sleep.   [DISCONTINUED] omeprazole (PRILOSEC) 40 MG capsule Take 40 mg by mouth daily.     Allergies:   Levofloxacin, Penicillins, Pepcid [famotidine], Penicillin g, and Penicillin g benzathine & proc   Social History   Socioeconomic History   Marital status: Married    Spouse name: Not on file   Number of  children: 2   Years of education: Not on file   Highest education level: Not on file  Occupational History   Not on file  Tobacco Use   Smoking status: Former   Smokeless tobacco: Never  Vaping Use   Vaping Use: Never used  Substance and Sexual Activity   Alcohol use: Yes    Alcohol/week: 5.0 standard drinks of alcohol    Types: 5 Glasses of wine per week   Drug use: Never   Sexual activity: Not on file  Other Topics Concern   Not on file  Social History Narrative   Not on file   Social Determinants of Health   Financial Resource Strain: Not on file  Food Insecurity: Not on file  Transportation Needs: Not on file  Physical Activity: Not on file  Stress: Not on file  Social Connections: Not on file     Family History: The patient's family history includes Breast cancer in his sister; CAD in his father and paternal uncle; Renal cancer in his sister; Thyroid cancer in his mother. ROS:   Please see the history of present illness.    All 14 point review of systems negative except as described per history of present illness  EKGs/Labs/Other Studies Reviewed:      Recent Labs: 01/01/2021: TSH 1.380 08/14/2021: ALT 32; BUN 11; Creatinine, Ser 1.22; Hemoglobin 16.2; Platelets 260; Potassium 4.7; Sodium 141  Recent Lipid Panel    Component Value Date/Time   CHOL 124 07/16/2021 1431   TRIG 131 07/16/2021 1431   HDL 46 07/16/2021 1431   CHOLHDL 2.7 07/16/2021 1431   LDLCALC 55 07/16/2021 1431    Physical Exam:    VS:  BP 130/84 (BP Location: Left Arm, Patient Position: Sitting, Cuff Size: Normal)   Pulse 92   Ht 6' (1.829 m)   Wt 229 lb (103.9 kg)   SpO2 95%   BMI 31.06 kg/m     Wt Readings from Last 3 Encounters:  10/18/21 229 lb (103.9 kg)  10/08/21 232 lb (105.2 kg)  10/04/21 234 lb (106.1 kg)     GEN:  Well nourished, well developed in no acute distress HEENT: Normal NECK: No JVD; No carotid bruits LYMPHATICS: No lymphadenopathy CARDIAC: RRR, no  murmurs, no rubs, no gallops RESPIRATORY:  Clear to auscultation without rales, wheezing or rhonchi  ABDOMEN: Soft, non-tender, non-distended MUSCULOSKELETAL:  No edema; No deformity  SKIN: Warm and dry LOWER EXTREMITIES: no swelling NEUROLOGIC:  Alert and oriented x 3 PSYCHIATRIC:  Normal affect   ASSESSMENT:    1. Palpitations   2. Aortic atherosclerosis ([place])   3. TIA (transient ischemic attack)   4. Primary hypertension   5. Atypical chest pain    PLAN:    In order of problems listed above:  TIA.  He was seen by neurologist teleneurology while in the hospital.  He was put  on dual antiplatelet therapy.  He is cholesterol is perfect.  Question is about potentially having arrhythmia.  I will ask him to wear Zio patch again to make sure were not dealing with paroxysmal atrial fibrillation.  He also will be referred to neurologist. Palpitations stable continue present management Aortic atherosclerosis: On antiplatelet therapy as well as statin which I will continue Essential hypertension: Blood pressure well controlled  I did review extensive record from the hospital which included MRI of his head and neck, echocardiogram showed preserved left ventricle ejection fraction as well as not by neurologist and primary care   Medication Adjustments/Labs and Tests Ordered: Current medicines are reviewed at length with the patient today.  Concerns regarding medicines are outlined above.  Orders Placed This Encounter  Procedures   Ambulatory referral to Neurology   LONG TERM MONITOR (3-14 DAYS)   Medication changes:  Meds ordered this encounter  Medications   pantoprazole (PROTONIX) 40 MG tablet    Sig: Take 1 tablet (40 mg total) by mouth daily.    Dispense:  90 tablet    Refill:  3    Signed, Park Liter, MD, [facility] 10/18/2021 11:28 AM    [place]

## 2021-10-22 ENCOUNTER — Encounter: Payer: Self-pay | Admitting: Family Medicine

## 2021-10-22 ENCOUNTER — Ambulatory Visit (INDEPENDENT_AMBULATORY_CARE_PROVIDER_SITE_OTHER): Payer: Medicare Other | Admitting: Family Medicine

## 2021-10-22 VITALS — BP 132/82 | HR 100 | Temp 97.3°F | Ht 72.0 in | Wt 231.0 lb

## 2021-10-22 DIAGNOSIS — I700 Atherosclerosis of aorta: Secondary | ICD-10-CM

## 2021-10-22 DIAGNOSIS — G459 Transient cerebral ischemic attack, unspecified: Secondary | ICD-10-CM | POA: Diagnosis not present

## 2021-10-22 DIAGNOSIS — I10 Essential (primary) hypertension: Secondary | ICD-10-CM

## 2021-10-22 DIAGNOSIS — R7989 Other specified abnormal findings of blood chemistry: Secondary | ICD-10-CM | POA: Diagnosis not present

## 2021-10-22 DIAGNOSIS — Z23 Encounter for immunization: Secondary | ICD-10-CM

## 2021-10-22 HISTORY — DX: Other specified abnormal findings of blood chemistry: R79.89

## 2021-10-22 NOTE — Assessment & Plan Note (Addendum)
Continue pravastatin 10 mg daily, aspirin, and plavix. Marland Kitchen

## 2021-10-22 NOTE — Assessment & Plan Note (Addendum)
Continue plavix and aspirin  Omeprazole changed to protonix.  Keep appt with neurology.

## 2021-10-22 NOTE — Patient Instructions (Signed)
Stroke education given.

## 2021-10-22 NOTE — Progress Notes (Unsigned)
Subjective:  Patient ID: Clarence Hill, male    DOB: 1945-05-26  Age: 76 y.o. MRN: [id number]  Chief Complaint  Patient presents with   Hospitalization Follow-up    HPI   Follow up Hospitalization  Patient was admitted to [facility] on 10/14/2021 and discharged on 10/15/2021. He was treated for TIA. Patient reports driving [place] [address] and suddenly, without warning, he had double-vision and became very lightheaded.  No pre-warnings, etc.  He pulled to the side of the road. After about one minute, the double vision went away, but he was still lightheaded.  He tried to continue driving, but after about 1/2 mile, he became very lightheaded again. So he cautiously went home, thinking his BP had dropped.  When he checked his BP, it was about 170/90, and his heart rate was 98-99 bpm. He continued monitoring my blood pressure all afternoon, hoping it would go down. But it stayed elevated. About 7 PM, he called Dr. Cecelia Byars office.  The cardiologist on call thought that he may have had a TIA and told him to go to the emergency room Treatment for this included Plavix 75 mg daily and aspirin 81 mg daily.. Telephone follow up was done on 10/15/2021 He has not had a recurrence of the double vision.  His work up included [place], CTA OF NECK AND BRAIN, ECHO, MRI BRAIN. ALL WAS NORMAL  He had a neuroconsult via televisit while in the hospital.  Has appt with Neurology 10/31/2021. Patient saw [name]. Has a zios patch that was placed.   Current Outpatient Medications on File Prior to Visit  Medication Sig Dispense Refill   allopurinol (ZYLOPRIM) 100 MG tablet Take 1 tablet (100 mg total) by mouth daily. 30 tablet 2   amLODipine (NORVASC) 10 MG tablet Take 1 tablet (10 mg total) by mouth daily. 90 tablet 3   aspirin EC 81 MG tablet Take 81 mg by mouth daily.     clopidogrel (PLAVIX) 75 MG tablet Take 75 mg by mouth daily.     colchicine 0.6 MG tablet TAKE ONE TABLET BY MOUTH DAILY AS NEEDED DURING GOUT  FLARE 30 tablet 0   fexofenadine (ALLEGRA HIVES 24HR) 180 MG tablet Take 180 mg by mouth daily.     hydrocortisone (ANUSOL-HC) 2.5 % rectal cream Place 1 Application rectally as needed for hemorrhoids.     Multiple Vitamins-Minerals (MULTIVITAMIN) LIQD Take 1 tablet by mouth daily. Unknown strength     nystatin-triamcinolone (MYCOLOG II) cream Apply 1 application. topically 2 (two) times daily. 30 g 0   pantoprazole (PROTONIX) 40 MG tablet Take 1 tablet (40 mg total) by mouth daily. 90 tablet 3   pravastatin (PRAVACHOL) 10 MG tablet TAKE 1 TABLET BY MOUTH AT BEDTIME 90 tablet 1   tiZANidine (ZANAFLEX) 2 MG tablet Take 1 tablet (2 mg total) by mouth every 8 (eight) hours as needed for muscle spasms. 30 tablet 0   vitamin B-12 (CYANOCOBALAMIN) 1000 MCG tablet Take 2,500 mcg by mouth daily.     zolpidem (AMBIEN) 10 MG tablet Take 10 mg by mouth at bedtime as needed for sleep.     No current facility-administered medications on file prior to visit.   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   Left lower quadrant pain 01/26/2020   Nodule of finger of left hand 01/14/2018   Palpitations 05/12/2017   Proud flesh 09/15/2019   Retinal edema 02/09/2019   S/P cataract extraction and insertion of intraocular lens, left 02/09/2019   Thrombosed external hemorrhoid 12/18/2017   Vitreous debris 02/09/2019   Past Surgical History:  Procedure Laterality Date   APPENDECTOMY     CARDIAC CATHETERIZATION     CATARACT EXTRACTION     EYE SURGERY Right    Laser    Family History  Problem  Relation Age of Onset   Thyroid cancer Mother    CAD Father    Breast cancer Sister    Renal cancer Sister    CAD Paternal Uncle    Social History   Socioeconomic History   Marital status: Married    Spouse name: Not on file   Number of children: 2   Years of education: Not on file   Highest education level: Not on file  Occupational History   Not on file  Tobacco Use   Smoking status: Former   Smokeless tobacco: Never  Vaping Use   Vaping Use: Never used  Substance and Sexual Activity   Alcohol use: Yes    Alcohol/week: 5.0 standard drinks of alcohol    Types: 5 Glasses of wine per week   Drug use: Never   Sexual activity: Not on file  Other Topics Concern   Not on file  Social History Narrative   Not on file   Social Determinants of Health   Financial Resource Strain: Not on file  Food Insecurity: Not on file  Transportation Needs: Not on file  Physical Activity: Not on file  Stress: Not on file  Social Connections: Not on file    Review of Systems  Constitutional:  Negative for chills, diaphoresis, fatigue and fever.  HENT:  Negative for congestion, ear pain and sore throat.   Respiratory:  Negative for cough and shortness of breath.   Cardiovascular:  Negative for chest pain and leg swelling.  Gastrointestinal:  Negative for abdominal pain, constipation, diarrhea, nausea and vomiting.  Genitourinary:  Negative for dysuria and urgency.  Musculoskeletal:  Negative for arthralgias and myalgias.  Neurological:  Positive for light-headedness and headaches. Negative for dizziness.  Psychiatric/Behavioral:  Negative for dysphoric mood.      Objective:  BP 132/82   Pulse 100   Temp (!) 97.3 F (36.3 C)   Ht 6' (1.829 m)   Wt 231 lb (104.8 kg)   SpO2 100%   BMI 31.33 kg/m      10/22/2021    2:28 PM 10/18/2021   10:19 AM 10/08/2021    2:32 PM  BP/Weight  Systolic BP 824 235 361  Diastolic BP 82 84 84  Wt. (Lbs) 231 229 232  BMI 31.33 kg/m2 31.06  kg/m2 31.46 kg/m2    Physical Exam Vitals reviewed.  Constitutional:      Appearance: Normal appearance.  Neck:     Vascular: No carotid bruit.  Cardiovascular:     Rate and Rhythm: Normal rate and regular rhythm.     Heart sounds: Normal heart sounds.  Pulmonary:     Effort: Pulmonary effort is normal.     Breath sounds: Normal breath sounds. No wheezing, rhonchi or rales.  Abdominal:  General: Bowel sounds are normal.     Palpations: Abdomen is soft.     Tenderness: There is no abdominal tenderness.  Neurological:     Mental Status: He is alert.  Psychiatric:        Mood and Affect: Mood normal.        Behavior: Behavior normal.     Diabetic Foot Exam - Simple   No data filed      Lab Results  Component Value Date   WBC 4.6 08/14/2021   HGB 16.2 08/14/2021   HCT 47.1 08/14/2021   PLT 260 08/14/2021   GLUCOSE 105 (H) 08/14/2021   CHOL 124 07/16/2021   TRIG 131 07/16/2021   HDL 46 07/16/2021   LDLCALC 55 07/16/2021   ALT 32 08/14/2021   AST 29 08/14/2021   NA 141 08/14/2021   K 4.7 08/14/2021   CL 103 08/14/2021   CREATININE 1.22 08/14/2021   BUN 11 08/14/2021   CO2 23 08/14/2021   TSH 1.380 01/01/2021      Assessment & Plan:   Problem List Items Addressed This Visit       Cardiovascular and Mediastinum   Hypertension    Well controlled.  No changes to medicines. Cotninue amlodipine 10 mg daily.  Continue to work on eating a healthy diet and exercise.  Labs drawn today.        Aortic atherosclerosis (HCC)    Continue pravastatin 10 mg daily, aspirin, and plavix. .       Relevant Orders   CBC with Differential/Platelet   TIA (transient ischemic attack)    Continue plavix and aspirin  Omeprazole changed to protonix.  Keep appt with neurology.       Relevant Orders   CBC with Differential/Platelet     Other   Elevated serum creatinine - Primary    Check cmp      Relevant Orders   Comprehensive metabolic panel  .  No orders  of the defined types were placed in this encounter.   Orders Placed This Encounter  Procedures   CBC with Differential/Platelet   Comprehensive metabolic panel     Follow-up: Return if symptoms worsen or fail to improve.  An After Visit Summary was printed and given to the patient.  [name], MD [facility] [phone]

## 2021-10-22 NOTE — Assessment & Plan Note (Signed)
Well controlled.  No changes to medicines. Cotninue amlodipine 10 mg daily.  Continue to work on eating a healthy diet and exercise.  Labs drawn today.

## 2021-10-22 NOTE — Assessment & Plan Note (Signed)
Check cmp 

## 2021-10-23 LAB — COMPREHENSIVE METABOLIC PANEL
ALT: 13 IU/L (ref 0–44)
AST: 17 IU/L (ref 0–40)
Albumin/Globulin Ratio: 1.5 (ref 1.2–2.2)
Albumin: 4.4 g/dL (ref 3.8–4.8)
Alkaline Phosphatase: 83 IU/L (ref 44–121)
BUN/Creatinine Ratio: 13 (ref 10–24)
BUN: 17 mg/dL (ref 8–27)
Bilirubin Total: 0.4 mg/dL (ref 0.0–1.2)
CO2: 21 mmol/L (ref 20–29)
Calcium: 9.4 mg/dL (ref 8.6–10.2)
Chloride: 106 mmol/L (ref 96–106)
Creatinine, Ser: 1.35 mg/dL — ABNORMAL HIGH (ref 0.76–1.27)
Globulin, Total: 3 g/dL (ref 1.5–4.5)
Glucose: 101 mg/dL — ABNORMAL HIGH (ref 70–99)
Potassium: 4.4 mmol/L (ref 3.5–5.2)
Sodium: 143 mmol/L (ref 134–144)
Total Protein: 7.4 g/dL (ref 6.0–8.5)
eGFR: 55 mL/min/{1.73_m2} — ABNORMAL LOW (ref >59)

## 2021-10-23 LAB — CBC WITH DIFFERENTIAL/PLATELET
Basophils Absolute: 0.1 10*3/uL (ref 0.0–0.2)
Basos: 1 %
EOS (ABSOLUTE): 0.1 10*3/uL (ref 0.0–0.4)
Eos: 2 %
Hematocrit: 44.9 % (ref 37.5–51.0)
Hemoglobin: 15.4 g/dL (ref 13.0–17.7)
Immature Grans (Abs): 0 10*3/uL (ref 0.0–0.1)
Immature Granulocytes: 0 %
Lymphocytes Absolute: 1.7 10*3/uL (ref 0.7–3.1)
Lymphs: 26 %
MCH: 31 pg (ref 26.6–33.0)
MCHC: 34.3 g/dL (ref 31.5–35.7)
MCV: 90 fL (ref 79–97)
Monocytes Absolute: 0.7 10*3/uL (ref 0.1–0.9)
Monocytes: 11 %
Neutrophils Absolute: 3.9 10*3/uL (ref 1.4–7.0)
Neutrophils: 60 %
Platelets: 320 10*3/uL (ref 150–450)
RBC: 4.97 x10E6/uL (ref 4.14–5.80)
RDW: 12.4 % (ref 11.6–15.4)
WBC: 6.5 10*3/uL (ref 3.4–10.8)

## 2021-10-23 NOTE — Progress Notes (Signed)
Blood count normal.  Liver function normal.  Kidney function abnormal. Push fluids. Recheck in 2 weeks. Avoid nsaids.

## 2021-10-24 ENCOUNTER — Other Ambulatory Visit: Payer: Self-pay

## 2021-10-24 DIAGNOSIS — N289 Disorder of kidney and ureter, unspecified: Secondary | ICD-10-CM

## 2021-10-29 ENCOUNTER — Telehealth: Payer: Self-pay | Admitting: Cardiology

## 2021-10-29 NOTE — Telephone Encounter (Signed)
Patient states his Zio Monitor is coming off. He would like to know if it is alright to take it off even though he is supposed to wait until Thursday, 10/05.

## 2021-10-29 NOTE — Telephone Encounter (Signed)
Spoke with pt. He stated that his zio was falling off. He has taped it and wanted to know if he could take it off a few days early if needed. Pt stated that he has had some episodes that he recorded. Encouraged him to write in the log that he took it off early due to it not sticking and send it to the company. He agreed and verbalized understanding. He had no further questions.

## 2021-10-31 ENCOUNTER — Encounter: Payer: Self-pay | Admitting: *Deleted

## 2021-10-31 ENCOUNTER — Ambulatory Visit (INDEPENDENT_AMBULATORY_CARE_PROVIDER_SITE_OTHER): Payer: Medicare Other | Admitting: Diagnostic Neuroimaging

## 2021-10-31 VITALS — BP 133/80 | HR 87 | Ht 72.0 in | Wt 234.0 lb

## 2021-10-31 DIAGNOSIS — H532 Diplopia: Secondary | ICD-10-CM | POA: Diagnosis not present

## 2021-10-31 DIAGNOSIS — R799 Abnormal finding of blood chemistry, unspecified: Secondary | ICD-10-CM

## 2021-10-31 NOTE — Progress Notes (Unsigned)
[facility]  PATIENT: Clarence Hill DOB: 02-24-1945  REFERRING CLINICIAN: CoxElnita Maxwell, MD HISTORY FROM: patient REASON FOR VISIT: new consult   HISTORICAL  CHIEF COMPLAINT:  Chief Complaint  Patient presents with   New Patient (Initial Visit)    Pt reports feeling fine. He states he head feels fuzzy. Still able to drive and walk every morning. Has headaches every now and then. He feels lightheaded and tired sometimes. Room 6 with wife    HISTORY OF PRESENT ILLNESS:   76 year old male here for evaluation of double vision.  09/30/2021 patient had ER evaluation for chest pressure.  10/13/2021 patient had 1 minute of double vision while driving on the highway.  He felt somewhat lightheaded.  He pulled over.  He went home and blood pressure was 170/95.  Heart rate was slightly elevated at 95.  He was somewhat anxious.  Patient went to the hospital again for evaluation.  CT, CTA, MRI, echocardiogram were negative.  Since that time patient is doing well.  No recurrent symptoms.    REVIEW OF SYSTEMS: Full 14 system review of systems performed and negative with exception of: As per HPI.  ALLERGIES: Allergies  Allergen Reactions   Levofloxacin Other (See Comments)   Penicillins    Pepcid [Famotidine]     Cough   Penicillin G Other (See Comments) and Rash    Unknown Unknown   Penicillin G Benzathine & Proc Rash    HOME MEDICATIONS: Outpatient Medications Prior to Visit  Medication Sig Dispense Refill   allopurinol (ZYLOPRIM) 100 MG tablet Take 1 tablet (100 mg total) by mouth daily. 30 tablet 2   amLODipine (NORVASC) 10 MG tablet Take 1 tablet (10 mg total) by mouth daily. 90 tablet 3   aspirin EC 81 MG tablet Take 81 mg by mouth daily.     clopidogrel (PLAVIX) 75 MG tablet Take 75 mg by mouth daily.     colchicine 0.6 MG tablet TAKE ONE TABLET BY MOUTH DAILY AS NEEDED DURING GOUT FLARE 30 tablet 0   fexofenadine (ALLEGRA HIVES 24HR) 180 MG tablet Take 180  mg by mouth daily.     hydrocortisone (ANUSOL-HC) 2.5 % rectal cream Place 1 Application rectally as needed for hemorrhoids.     Multiple Vitamins-Minerals (MULTIVITAMIN) LIQD Take 1 tablet by mouth daily. Unknown strength     nystatin-triamcinolone (MYCOLOG II) cream Apply 1 application. topically 2 (two) times daily. 30 g 0   pantoprazole (PROTONIX) 40 MG tablet Take 1 tablet (40 mg total) by mouth daily. 90 tablet 3   pravastatin (PRAVACHOL) 10 MG tablet TAKE 1 TABLET BY MOUTH AT BEDTIME 90 tablet 1   vitamin B-12 (CYANOCOBALAMIN) 1000 MCG tablet Take 2,500 mcg by mouth daily.     zolpidem (AMBIEN) 10 MG tablet Take 10 mg by mouth at bedtime as needed for sleep.     tiZANidine (ZANAFLEX) 2 MG tablet Take 1 tablet (2 mg total) by mouth every 8 (eight) hours as needed for muscle spasms. (Patient not taking: Reported on 10/31/2021) 30 tablet 0   No facility-administered medications prior to visit.    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   Left lower quadrant pain 01/26/2020   Nodule of finger of left hand 01/14/2018   Palpitations 05/12/2017   Proud flesh 09/15/2019   Retinal edema 02/09/2019   S/P cataract extraction and insertion of intraocular lens, left 02/09/2019   Thrombosed external hemorrhoid 12/18/2017   TIA (transient ischemic attack)    Vitreous debris 02/09/2019    PAST SURGICAL HISTORY: Past Surgical History:  Procedure Laterality Date   APPENDECTOMY     CARDIAC CATHETERIZATION     CATARACT EXTRACTION      EYE SURGERY Right    Laser    FAMILY HISTORY: Family History  Problem Relation Age of Onset   Thyroid cancer Mother    CAD Father    Breast cancer Sister    Renal cancer Sister    CAD Paternal Uncle     SOCIAL HISTORY: Social History   Socioeconomic History   Marital status: Married    Spouse name: Not on file   Number of children: 2   Years of education: Not on file   Highest education level: Not on file  Occupational History   Not on file  Tobacco Use   Smoking status: Former   Smokeless tobacco: Never  Vaping Use   Vaping Use: Never used  Substance and Sexual Activity   Alcohol use: Yes    Alcohol/week: 5.0 standard drinks of alcohol    Types: 5 Glasses of wine per week   Drug use: Never   Sexual activity: Not on file  Other Topics Concern   Not on file  Social History Narrative   Lives with wife   Social Determinants of Health   Financial Resource Strain: Not on file  Food Insecurity: Not on file  Transportation Needs: Not on file  Physical Activity: Not on file  Stress: Not on file  Social Connections: Not on file  Intimate Partner Violence: Not on file     PHYSICAL EXAM  GENERAL EXAM/CONSTITUTIONAL: Vitals:  Vitals:   10/31/21 1022  BP: 133/80  Pulse: 87  Weight: 234 lb (106.1 kg)  Height: 6' (1.829 m)   Body mass index is 31.74 kg/m. Wt Readings from Last 3 Encounters:  10/31/21 234 lb (106.1 kg)  10/22/21 231 lb (104.8 kg)  10/18/21 229 lb (103.9 kg)   Patient is in no distress; well developed, nourished and groomed; neck is supple  CARDIOVASCULAR: Examination of carotid arteries is normal; no carotid bruits Regular rate and rhythm, no murmurs Examination of peripheral vascular system by observation and palpation is normal  EYES: Ophthalmoscopic exam of optic discs and posterior segments is normal; no papilledema or hemorrhages No results found.  MUSCULOSKELETAL: Gait, strength, tone, movements noted in Neurologic exam  below  NEUROLOGIC: MENTAL STATUS:      No data to display         awake, alert, oriented to person, place and time recent and remote memory intact normal attention and concentration language fluent, comprehension intact, naming intact fund of knowledge appropriate  CRANIAL NERVE:  2nd - no papilledema on fundoscopic exam 2nd, 3rd, 4th, 6th - pupils equal and reactive to light, visual fields full to confrontation, extraocular muscles intact, no nystagmus 5th - facial sensation symmetric 7th - facial strength symmetric 8th - hearing intact 9th - palate elevates symmetrically, uvula midline 11th - shoulder shrug symmetric 12th - tongue protrusion midline  MOTOR:  normal bulk and tone, full strength in the BUE, BLE  SENSORY:  normal and symmetric to light touch, temperature, vibration  COORDINATION:  finger-nose-finger, fine finger movements normal  REFLEXES:  deep tendon reflexes present and symmetric  GAIT/STATION:  narrow based gait     DIAGNOSTIC DATA (LABS, IMAGING, TESTING) - I reviewed patient records, labs, notes, testing and imaging myself where available.  Lab Results  Component Value Date   WBC 6.5 10/22/2021   HGB 15.4 10/22/2021   HCT 44.9 10/22/2021   MCV 90 10/22/2021   PLT 320 10/22/2021      Component Value Date/Time   NA 143 10/22/2021 1525   K 4.4 10/22/2021 1525   CL 106 10/22/2021 1525   CO2 21 10/22/2021 1525   GLUCOSE 101 (H) 10/22/2021 1525   BUN 17 10/22/2021 1525   CREATININE 1.35 (H) 10/22/2021 1525   CALCIUM 9.4 10/22/2021 1525   PROT 7.4 10/22/2021 1525   ALBUMIN 4.4 10/22/2021 1525   AST 17 10/22/2021 1525   ALT 13 10/22/2021 1525   ALKPHOS 83 10/22/2021 1525   BILITOT 0.4 10/22/2021 1525   GFRNONAA 59 (L) 11/12/2017 0959   GFRAA 68 11/12/2017 0959   Lab Results  Component Value Date   CHOL 124 07/16/2021   HDL 46 07/16/2021   LDLCALC 55 07/16/2021   TRIG 131 07/16/2021   CHOLHDL 2.7 07/16/2021   No results  found for: "HGBA1C" No results found for: "VITAMINB12" Lab Results  Component Value Date   TSH 1.380 01/01/2021       ASSESSMENT AND PLAN  76 y.o. year old male here with:   Dx:  1. Double vision with both eyes open      PLAN:  TRANSIENT DOUBLE VISION / LIGHTHEADEDNESS (possible TIA; need to rule out neuromuscular causes; also could have been presyncope) - aspirin + plavix x 3 weeks; then aspirin '81mg'$  daily alone - continue BP control, statin - check AchR, TSH, B12, A1c  Orders Placed This Encounter  Procedures   AChR Abs with Reflex to MuSK   TSH   Vitamin B12   Hemoglobin A1c   Return for pending if symptoms worsen or fail to improve.    Penni Bombard, MD 48/08/8914, 94:50 AM Certified in Neurology, Neurophysiology and Neuroimaging  [facility] [address], [place], [place] [address] [phone]

## 2021-10-31 NOTE — Patient Instructions (Addendum)
  TRANSIENT DOUBLE VISION / LIGHTHEADEDNESS (possible TIA; need to rule out neuromuscular causes; also could have been presyncope)  - aspirin + plavix x 3 weeks; then aspirin '81mg'$  daily alone  - continue BP control, statin  - check AchR, TSH, B12, A1c

## 2021-11-01 ENCOUNTER — Encounter: Payer: Self-pay | Admitting: Diagnostic Neuroimaging

## 2021-11-07 ENCOUNTER — Other Ambulatory Visit: Payer: Medicare Other

## 2021-11-08 ENCOUNTER — Other Ambulatory Visit: Payer: Self-pay

## 2021-11-08 ENCOUNTER — Telehealth: Payer: Self-pay | Admitting: Cardiology

## 2021-11-08 ENCOUNTER — Other Ambulatory Visit: Payer: Medicare Other

## 2021-11-08 DIAGNOSIS — N289 Disorder of kidney and ureter, unspecified: Secondary | ICD-10-CM

## 2021-11-08 DIAGNOSIS — R002 Palpitations: Secondary | ICD-10-CM | POA: Diagnosis not present

## 2021-11-08 NOTE — Telephone Encounter (Signed)
Received a call on the STAT phone from [employer] to inform us that the patient had a 5.6 second pause on 9/22 at 6:46 pm. Informed the DOD which was Dr. Bettina Gavia and he recommended that he not drive and that he be referred to EP to be seen as soon as possible. The patient was informed of these orders from Dr. Bettina Gavia and had no further questions at this time.

## 2021-11-08 NOTE — Telephone Encounter (Signed)
Calling with abnormal results for the zio patch

## 2021-11-09 LAB — COMPREHENSIVE METABOLIC PANEL
ALT: 23 IU/L (ref 0–44)
AST: 25 IU/L (ref 0–40)
Albumin/Globulin Ratio: 1.6 (ref 1.2–2.2)
Albumin: 4.3 g/dL (ref 3.8–4.8)
Alkaline Phosphatase: 69 IU/L (ref 44–121)
BUN/Creatinine Ratio: 12 (ref 10–24)
BUN: 14 mg/dL (ref 8–27)
Bilirubin Total: 0.6 mg/dL (ref 0.0–1.2)
CO2: 24 mmol/L (ref 20–29)
Calcium: 8.8 mg/dL (ref 8.6–10.2)
Chloride: 104 mmol/L (ref 96–106)
Creatinine, Ser: 1.13 mg/dL (ref 0.76–1.27)
Globulin, Total: 2.7 g/dL (ref 1.5–4.5)
Glucose: 95 mg/dL (ref 70–99)
Potassium: 4.4 mmol/L (ref 3.5–5.2)
Sodium: 139 mmol/L (ref 134–144)
Total Protein: 7 g/dL (ref 6.0–8.5)
eGFR: 68 mL/min/{1.73_m2} (ref >59)

## 2021-11-12 ENCOUNTER — Encounter: Payer: Self-pay | Admitting: *Deleted

## 2021-11-12 ENCOUNTER — Encounter: Payer: Self-pay | Admitting: Cardiology

## 2021-11-12 ENCOUNTER — Ambulatory Visit: Payer: Medicare Other | Attending: Cardiology | Admitting: Cardiology

## 2021-11-12 VITALS — BP 142/77 | HR 85 | Ht 72.0 in | Wt 234.0 lb

## 2021-11-12 DIAGNOSIS — Z01812 Encounter for preprocedural laboratory examination: Secondary | ICD-10-CM | POA: Diagnosis not present

## 2021-11-12 DIAGNOSIS — I442 Atrioventricular block, complete: Secondary | ICD-10-CM | POA: Diagnosis not present

## 2021-11-12 DIAGNOSIS — I459 Conduction disorder, unspecified: Secondary | ICD-10-CM | POA: Insufficient documentation

## 2021-11-12 NOTE — Progress Notes (Addendum)
Electrophysiology Office Note   Date:  11/12/2021   ID:  Clarence Hill, DOB 27-Nov-1945, MRN [id number]  PCP:  [name], MD  Cardiologist:  Agustin Cree Primary Electrophysiologist:  Constance Haw, MD    Chief Complaint: heart block   History of Present Illness: Clarence Hill is a 76 y.o. male who is being seen today for the evaluation of heart block at the request of Bettina Gavia Hilton Cork, MD. Presenting today for electrophysiology evaluation.  He has a history significant for PVCs, hypertension, dizziness, atypical chest pain.  He went to [facility] after driving.  He suddenly felt an episode of double vision that lasted approximately 30 seconds.  This subsided but he did not feel well and had lightheadedness.  When in the emergency room, it was thought that he may have had a TIA.  He wore a cardiac monitor and had an episode of dizziness and feeling like he had a pause in his heart rhythm.  He has felt this before.  He had a 5.23-second pause.  Today, he denies symptoms of palpitations, chest pain, shortness of breath, orthopnea, PND, lower extremity edema, claudication, dizziness, presyncope, syncope, bleeding, or neurologic sequela. The patient is tolerating medications without difficulties.    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   Left lower quadrant pain 01/26/2020   Nodule of finger of left hand 01/14/2018   Palpitations 05/12/2017   Proud flesh 09/15/2019   Retinal edema 02/09/2019   S/P cataract extraction and insertion of  intraocular lens, left 02/09/2019   Thrombosed external hemorrhoid 12/18/2017   TIA (transient ischemic attack)    Vitreous debris 02/09/2019   Past Surgical History:  Procedure Laterality Date   APPENDECTOMY     CARDIAC CATHETERIZATION     CATARACT EXTRACTION     EYE SURGERY Right    Laser     Current Outpatient Medications  Medication Sig Dispense Refill   allopurinol (ZYLOPRIM) 100 MG tablet Take 1 tablet (100 mg total) by mouth daily. 30 tablet 2   amLODipine (NORVASC) 10 MG tablet Take 1 tablet (10 mg total) by mouth daily. 90 tablet 3   aspirin EC 81 MG tablet Take 81 mg by mouth daily.     colchicine 0.6 MG tablet TAKE ONE TABLET BY MOUTH DAILY AS NEEDED DURING GOUT FLARE 30 tablet 0   fexofenadine (ALLEGRA HIVES 24HR) 180 MG tablet Take 180 mg by mouth daily.     hydrocortisone (ANUSOL-HC) 2.5 % rectal cream Place 1 Application rectally as needed for hemorrhoids.     Multiple Vitamins-Minerals (MULTIVITAMIN) LIQD Take 1 tablet by mouth daily. Unknown strength     nystatin-triamcinolone (MYCOLOG II) cream Apply 1 application. topically 2 (two) times daily. 30 g 0   pantoprazole (PROTONIX) 40 MG tablet Take 1 tablet (40 mg total) by mouth daily. 90 tablet 3   pravastatin (PRAVACHOL) 10 MG tablet TAKE 1 TABLET BY MOUTH AT BEDTIME 90 tablet 1   tiZANidine (ZANAFLEX) 2 MG tablet Take 1 tablet (2 mg total) by mouth every 8 (eight)  hours as needed for muscle spasms. 30 tablet 0   vitamin B-12 (CYANOCOBALAMIN) 1000 MCG tablet Take 2,500 mcg by mouth daily.     zolpidem (AMBIEN) 10 MG tablet Take 10 mg by mouth at bedtime as needed for sleep.     No current facility-administered medications for this visit.    Allergies:   Levofloxacin, Penicillins, Pepcid [famotidine], Penicillin g, and Penicillin g benzathine & proc   Social History:  The patient  reports that he has quit smoking. He has never used smokeless tobacco. He reports current alcohol use of about 5.0 standard drinks of  alcohol per week. He reports that he does not use drugs.   Family History:  The patient's family history includes Breast cancer in his sister; CAD in his father and paternal uncle; Renal cancer in his sister; Thyroid cancer in his mother.    ROS:  Please see the history of present illness.   Otherwise, review of systems is positive for none.   All other systems are reviewed and negative.    PHYSICAL EXAM: VS:  BP (!) 142/77   Pulse 85   Ht 6' (1.829 m)   Wt 234 lb (106.1 kg)   SpO2 97%   BMI 31.74 kg/m  , BMI Body mass index is 31.74 kg/m. GEN: Well nourished, well developed, in no acute distress  HEENT: normal  Neck: no JVD, carotid bruits, or masses Cardiac: RRR; no murmurs, rubs, or gallops,no edema  Respiratory:  clear to auscultation bilaterally, normal work of breathing GI: soft, nontender, nondistended, + BS MS: no deformity or atrophy  Skin: warm and dry Neuro:  Strength and sensation are intact Psych: euthymic mood, full affect  EKG:  EKG is not ordered today. Personal review of the ekg ordered 10/09/21 shows sinus rhythm, rate 66, left anterior fascicular block   Recent Labs: 10/22/2021: Hemoglobin 15.4; Platelets 320 10/31/2021: TSH 1.820 11/08/2021: ALT 23; BUN 14; Creatinine, Ser 1.13; Potassium 4.4; Sodium 139    Lipid Panel     Component Value Date/Time   CHOL 124 07/16/2021 1431   TRIG 131 07/16/2021 1431   HDL 46 07/16/2021 1431   CHOLHDL 2.7 07/16/2021 1431   LDLCALC 55 07/16/2021 1431     Wt Readings from Last 3 Encounters:  11/12/21 234 lb (106.1 kg)  10/31/21 234 lb (106.1 kg)  10/22/21 231 lb (104.8 kg)      Other studies Reviewed: Additional studies/ records that were reviewed today include: TTE 10/14/21 Review of the above records today demonstrates:  Ejection fraction 65% Impaired relaxation pattern Mild mitral regurgitation Mild aortic regurgitation   ASSESSMENT AND PLAN:  1.  Intermittent complete heart block: Patient is not on  any AV nodal blocking agents.  He is quite concerned about these episodes.  He felt poorly when he had his pause.  Due to that, we Donnice Nielsen plan for pacemaker implant.  Risk and benefits were discussed which include bleeding, tamponade, infection, pneumothorax, lead dislodgment.  He understands these risks and is agreed to the procedure.  2.  Hypertension: Currently well controlled  3.  TIA: We Kennon Encinas be able to monitor for atrial fibrillation through pacemaker.  Current medicines are reviewed at length with the patient today.   The patient does not have concerns regarding his medicines.  The following changes were made today:  none  Labs/ tests ordered today include:  Orders Placed This Encounter  Procedures   Basic metabolic panel   CBC     Disposition:  FU with [place] 3 months  Signed, Talullah Abate Meredith Leeds, MD  11/12/2021 3:40 PM     [facility] [address] [place] [address] [phone] (office) [phone] (fax)

## 2021-11-12 NOTE — H&P (View-Only) (Signed)
Electrophysiology Office Note   Date:  11/12/2021   ID:  Clarence Hill, DOB 06-26-1945, MRN [id number]  PCP:  [name], MD  Cardiologist:  Agustin Cree Primary Electrophysiologist:  Constance Haw, MD    Chief Complaint: heart block   History of Present Illness: Clarence Hill is a 76 y.o. male who is being seen today for the evaluation of heart block at the request of Bettina Gavia Hilton Cork, MD. Presenting today for electrophysiology evaluation.  He has a history significant for PVCs, hypertension, dizziness, atypical chest pain.  He went to [facility] after driving.  He suddenly felt an episode of double vision that lasted approximately 30 seconds.  This subsided but he did not feel well and had lightheadedness.  When in the emergency room, it was thought that he may have had a TIA.  He wore a cardiac monitor and had an episode of dizziness and feeling like he had a pause in his heart rhythm.  He has felt this before.  He had a 5.23-second pause.  Today, he denies symptoms of palpitations, chest pain, shortness of breath, orthopnea, PND, lower extremity edema, claudication, dizziness, presyncope, syncope, bleeding, or neurologic sequela. The patient is tolerating medications without difficulties.    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   Left lower quadrant pain 01/26/2020   Nodule of finger of left hand 01/14/2018   Palpitations 05/12/2017   Proud flesh 09/15/2019   Retinal edema 02/09/2019   S/P cataract extraction and insertion of  intraocular lens, left 02/09/2019   Thrombosed external hemorrhoid 12/18/2017   TIA (transient ischemic attack)    Vitreous debris 02/09/2019   Past Surgical History:  Procedure Laterality Date   APPENDECTOMY     CARDIAC CATHETERIZATION     CATARACT EXTRACTION     EYE SURGERY Right    Laser     Current Outpatient Medications  Medication Sig Dispense Refill   allopurinol (ZYLOPRIM) 100 MG tablet Take 1 tablet (100 mg total) by mouth daily. 30 tablet 2   amLODipine (NORVASC) 10 MG tablet Take 1 tablet (10 mg total) by mouth daily. 90 tablet 3   aspirin EC 81 MG tablet Take 81 mg by mouth daily.     colchicine 0.6 MG tablet TAKE ONE TABLET BY MOUTH DAILY AS NEEDED DURING GOUT FLARE 30 tablet 0   fexofenadine (ALLEGRA HIVES 24HR) 180 MG tablet Take 180 mg by mouth daily.     hydrocortisone (ANUSOL-HC) 2.5 % rectal cream Place 1 Application rectally as needed for hemorrhoids.     Multiple Vitamins-Minerals (MULTIVITAMIN) LIQD Take 1 tablet by mouth daily. Unknown strength     nystatin-triamcinolone (MYCOLOG II) cream Apply 1 application. topically 2 (two) times daily. 30 g 0   pantoprazole (PROTONIX) 40 MG tablet Take 1 tablet (40 mg total) by mouth daily. 90 tablet 3   pravastatin (PRAVACHOL) 10 MG tablet TAKE 1 TABLET BY MOUTH AT BEDTIME 90 tablet 1   tiZANidine (ZANAFLEX) 2 MG tablet Take 1 tablet (2 mg total) by mouth every 8 (eight)  hours as needed for muscle spasms. 30 tablet 0   vitamin B-12 (CYANOCOBALAMIN) 1000 MCG tablet Take 2,500 mcg by mouth daily.     zolpidem (AMBIEN) 10 MG tablet Take 10 mg by mouth at bedtime as needed for sleep.     No current facility-administered medications for this visit.    Allergies:   Levofloxacin, Penicillins, Pepcid [famotidine], Penicillin g, and Penicillin g benzathine & proc   Social History:  The patient  reports that he has quit smoking. He has never used smokeless tobacco. He reports current alcohol use of about 5.0 standard drinks of  alcohol per week. He reports that he does not use drugs.   Family History:  The patient's family history includes Breast cancer in his sister; CAD in his father and paternal uncle; Renal cancer in his sister; Thyroid cancer in his mother.    ROS:  Please see the history of present illness.   Otherwise, review of systems is positive for none.   All other systems are reviewed and negative.    PHYSICAL EXAM: VS:  BP (!) 142/77   Pulse 85   Ht 6' (1.829 m)   Wt 234 lb (106.1 kg)   SpO2 97%   BMI 31.74 kg/m  , BMI Body mass index is 31.74 kg/m. GEN: Well nourished, well developed, in no acute distress  HEENT: normal  Neck: no JVD, carotid bruits, or masses Cardiac: RRR; no murmurs, rubs, or gallops,no edema  Respiratory:  clear to auscultation bilaterally, normal work of breathing GI: soft, nontender, nondistended, + BS MS: no deformity or atrophy  Skin: warm and dry Neuro:  Strength and sensation are intact Psych: euthymic mood, full affect  EKG:  EKG is not ordered today. Personal review of the ekg ordered 10/09/21 shows sinus rhythm, rate 66, left anterior fascicular block   Recent Labs: 10/22/2021: Hemoglobin 15.4; Platelets 320 10/31/2021: TSH 1.820 11/08/2021: ALT 23; BUN 14; Creatinine, Ser 1.13; Potassium 4.4; Sodium 139    Lipid Panel     Component Value Date/Time   CHOL 124 07/16/2021 1431   TRIG 131 07/16/2021 1431   HDL 46 07/16/2021 1431   CHOLHDL 2.7 07/16/2021 1431   LDLCALC 55 07/16/2021 1431     Wt Readings from Last 3 Encounters:  11/12/21 234 lb (106.1 kg)  10/31/21 234 lb (106.1 kg)  10/22/21 231 lb (104.8 kg)      Other studies Reviewed: Additional studies/ records that were reviewed today include: TTE 10/14/21 Review of the above records today demonstrates:  Ejection fraction 65% Impaired relaxation pattern Mild mitral regurgitation Mild aortic regurgitation   ASSESSMENT AND PLAN:  1.  Intermittent heart block: Patient is not on any AV  nodal blocking agents.  He is quite concerned about these episodes.  He felt poorly when he had his pause.  Due to that, we Charmika Macdonnell plan for pacemaker implant.  Risk and benefits were discussed which include bleeding, tamponade, infection, pneumothorax, lead dislodgment.  He understands these risks and is agreed to the procedure.  2.  Hypertension: Currently well controlled  3.  TIA: We Xochilt Conant be able to monitor for atrial fibrillation through pacemaker.  Current medicines are reviewed at length with the patient today.   The patient does not have concerns regarding his medicines.  The following changes were made today:  none  Labs/ tests ordered today include:  Orders Placed This Encounter  Procedures   Basic metabolic panel   CBC     Disposition:  FU with K[place] Cut[facility] 3 months  Signed, Myrl Lazarus Meredith Leeds, MD  11/12/2021 3:40 PM     [facility] [address] [place] [address] [phone] (office) [phone] (fax)

## 2021-11-12 NOTE — Patient Instructions (Addendum)
Medication Instructions:  Your physician recommends that you continue on your current medications as directed. Please refer to the Current Medication list given to you today.  *If you need a refill on your cardiac medications before your next appointment, please call your pharmacy*   Lab Work: Pre procedure labs --- see procedure instruction letter  If you have labs (blood work) drawn today and your tests are completely normal, you will receive your results only by: [place] (if you have MyChart) OR A paper copy in the mail If you have any lab test that is abnormal or we need to change your treatment, we will call you to review the results.   Testing/Procedures: Your physician has recommended that you have a pacemaker inserted. A pacemaker is a small device that is placed under the skin of your chest or abdomen to help control abnormal heart rhythms. This device uses electrical pulses to prompt the heart to beat at a normal rate. Pacemakers are used to treat heart rhythms that are too slow. Wire (leads) are attached to the pacemaker that goes into the chambers of you heart. This is done in the hospital and usually requires and overnight stay. Please see the instruction sheet given to you today for more information.   Follow-Up: At [facility], you and your health needs are our priority.  As part of our continuing mission to provide you with exceptional heart care, we have created designated Provider Care Teams.  These Care Teams include your primary Cardiologist (physician) and Advanced Practice Providers (APPs -  Physician Assistants and Nurse Practitioners) who all work together to provide you with the care you need, when you need it.   Your next appointment:   2 week(s) after your pacemaker implant  The format for your next appointment:   In Person  Provider:   Device clinic for a wound check     Thank you for choosing [facility]!!   Trinidad Curet, RN [phone]  Other Instructions   Important Information About Sugar         Pacemaker Implantation, Adult Pacemaker implantation is a procedure to place a pacemaker inside your chest. A pacemaker is a small computer that sends electrical signals to the heart and helps your heart beat normally. A pacemaker also stores information about your heart rhythms. You may need pacemaker implantation if you: Have a slow heartbeat (bradycardia). Faint (syncope). Have shortness of breath (dyspnea) due to heart problems.  The pacemaker attaches to your heart through a wire, called a lead. Sometimes just one lead is needed. Other times, there will be two leads. There are two types of pacemakers: Transvenous pacemaker. This type is placed under the skin or muscle of your chest. The lead goes through a vein in the chest area to reach the inside of the heart. Epicardial pacemaker. This type is placed under the skin or muscle of your chest or belly. The lead goes through your chest to the outside of the heart.  Tell a health care provider about: Any allergies you have. All medicines you are taking, including vitamins, herbs, eye drops, creams, and over-the-counter medicines. Any problems you or family members have had with anesthetic medicines. Any blood or bone disorders you have. Any surgeries you have had. Any medical conditions you have. Whether you are pregnant or may be pregnant. What are the risks? Generally, this is a safe procedure. However, problems may occur, including: Infection. Bleeding. Failure of the pacemaker or the lead. Collapse  of a lung or bleeding into a lung. Blood clot inside a blood vessel with a lead. Damage to the heart. Infection inside the heart (endocarditis). Allergic reactions to medicines.  What happens before the procedure? Staying hydrated Follow instructions from your health care provider about hydration, which may include: Up to 2 hours before the  procedure - you may continue to drink clear liquids, such as water, clear fruit juice, black coffee, and plain tea.  Eating and drinking restrictions Follow instructions from your health care provider about eating and drinking, which may include: 8 hours before the procedure - stop eating heavy meals or foods such as meat, fried foods, or fatty foods. 6 hours before the procedure - stop eating light meals or foods, such as toast or cereal. 6 hours before the procedure - stop drinking milk or drinks that contain milk. 2 hours before the procedure - stop drinking clear liquids.  Medicines Ask your health care provider about: Changing or stopping your regular medicines. This is especially important if you are taking diabetes medicines or blood thinners. Taking medicines such as aspirin and ibuprofen. These medicines can thin your blood. Do not take these medicines before your procedure if your health care provider instructs you not to. You may be given antibiotic medicine to help prevent infection. General instructions You will have a heart evaluation. This may include an electrocardiogram (ECG), chest X-ray, and heart imaging (echocardiogram,  or echo) tests. You will have blood tests. Do not use any products that contain nicotine or tobacco, such as cigarettes and e-cigarettes. If you need help quitting, ask your health care provider. Plan to have someone take you home from the hospital or clinic. If you will be going home right after the procedure, plan to have someone with you for 24 hours. Ask your health care provider how your surgical site will be marked or identified. What happens during the procedure? To reduce your risk of infection: Your health care team will wash or sanitize their hands. Your skin will be washed with soap. Hair may be removed from the surgical area. An IV tube will be inserted into one of your veins. You will be given one or more of the following: A medicine to  help you relax (sedative). A medicine to numb the area (local anesthetic). A medicine to make you fall asleep (general anesthetic). If you are getting a transvenous pacemaker: An incision will be made in your upper chest. A pocket will be made for the pacemaker. It may be placed under the skin or between layers of muscle. The lead will be inserted into a blood vessel that returns to the heart. While X-rays are taken by an imaging machine (fluoroscopy), the lead will be advanced through the vein to the inside of your heart. The other end of the lead will be tunneled under the skin and attached to the pacemaker. If you are getting an epicardial pacemaker: An incision will be made near your ribs or breastbone (sternum) for the lead. The lead will be attached to the outside of your heart. Another incision will be made in your chest or upper belly to create a pocket for the pacemaker. The free end of the lead will be tunneled under the skin and attached to the pacemaker. The transvenous or epicardial pacemaker will be tested. Imaging studies may be done to check the lead position. The incisions will be closed with stitches (sutures), adhesive strips, or skin glue. Bandages (dressing) will be placed over the  incisions. The procedure may vary among health care providers and hospitals. What happens after the procedure? Your blood pressure, heart rate, breathing rate, and blood oxygen level will be monitored until the medicines you were given have worn off. You will be given antibiotics and pain medicine. ECG and chest x-rays will be done. You will wear a continuous type of ECG (Holter monitor) to check your heart rhythm. Your health care provider will program the pacemaker. Do not drive for 24 hours if you received a sedative. This information is not intended to replace advice given to you by your health care provider. Make sure you discuss any questions you have with your health care  provider. Document Released: 01/04/2002 Document Revised: 08/04/2015 Document Reviewed: 06/28/2015 Elsevier Interactive Patient Education  2018 [employer].     Pacemaker Implantation, Adult, Care After This sheet gives you information about how to care for yourself after your procedure. Your health care provider may also give you more specific instructions. If you have problems or questions, contact your health care provider. What can I expect after the procedure? After the procedure, it is common to have: Mild pain. Slight bruising. Some swelling over the incision. A slight bump over the skin where the device was placed. Sometimes, it is possible to feel the device under the skin. This is normal.  Follow these instructions at home: Medicines Take over-the-counter and prescription medicines only as told by your health care provider. If you were prescribed an antibiotic medicine, take it as told by your health care provider. Do not stop taking the antibiotic even if you start to feel better. Wound care Do not remove the bandage on your chest until directed to do so by your health care provider. After your bandage is removed, you may see pieces of tape called skin adhesive strips over the area where the cut was made (incision site). Let them fall off on their own. Check the incision site every day to make sure it is not infected, bleeding, or starting to pull apart. Do not use lotions or ointments near the incision site unless directed to do so. Keep the incision area clean and dry for 2-3 days after the procedure or as directed by your health care provider. It takes several weeks for the incision site to completely heal. Do not take baths, swim, or use a hot tub for 7-10 days or as otherwise directed by your health care provider. Activity Do not drive or use heavy machinery while taking prescription pain medicine. Do not drive for 24 hours if you were given a medicine to help you relax  (sedative). Check with your health care provider before you start to drive or play sports. Avoid sudden jerking, pulling, or chopping movements that pull your upper arm far away from your body. Avoid these movements for at least 6 weeks or as long as told by your health care provider. Do not lift your upper arm above your shoulders for at least 6 weeks or as long as told by your health care provider. This means no tennis, golf, or swimming. You may go back to work when your health care provider says it is okay. Pacemaker care You may be shown how to transfer data from your pacemaker through the phone to your health care provider. Always let all health care providers know about your pacemaker before you have any medical procedures or tests. Wear a medical ID bracelet or necklace stating that you have a pacemaker. Carry a pacemaker ID  card with you at all times. Your pacemaker battery will last for 5-15 years. Routine checks by your health care provider will let the health care provider know when the battery is starting to run down. The pacemaker will need to be replaced when the battery starts to run down. Do not use amateur Chief of Staff. Other electrical devices are safe to use, including power tools, lawn mowers, and speakers. If you are unsure of whether something is safe to use, ask your health care provider. When using your cell phone, hold it to the ear opposite the pacemaker. Do not leave your cell phone in a pocket over the pacemaker. Avoid places or objects that have a strong electric or magnetic field, including: Engineer, maintenance. When at the airport, let officials know that you have a pacemaker. Power plants. Large electrical generators. Radiofrequency transmission towers, such as cell phone and radio towers. General instructions Weigh yourself every day. If you suddenly gain weight, fluid may be building up in your body. Keep all follow-up visits as  told by your health care provider. This is important. Contact a health care provider if: You gain weight suddenly. Your legs or feet swell. It feels like your heart is fluttering or skipping beats (heart palpitations). You have chills or a fever. You have more redness, swelling, or pain around your incisions. You have more fluid or blood coming from your incisions. Your incisions feel warm to the touch. You have pus or a bad smell coming from your incisions. Get help right away if: You have chest pain. You have trouble breathing or are short of breath. You become extremely tired. You are light-headed or you faint. This information is not intended to replace advice given to you by your health care provider. Make sure you discuss any questions you have with your health care provider. Document Released: 08/03/2004 Document Revised: 10/27/2015 Document Reviewed: 10/27/2015 Elsevier Interactive Patient Education  2018 [place] Discharge Instructions for  Pacemaker/Defibrillator Patients  ACTIVITY No heavy lifting or vigorous activity with your left/right arm for 6 to 8 weeks.  Do not raise your left/right arm above your head for one week.  Gradually raise your affected arm as drawn below.           __  NO DRIVING for     ; you may begin driving on     .  WOUND CARE Keep the wound area clean and dry.  Do not get this area wet for one week. No showers for one week; you may shower on     . The tape/steri-strips on your wound will fall off; do not pull them off.  No bandage is needed on the site.  DO  NOT apply any creams, oils, or ointments to the wound area. If you notice any drainage or discharge from the wound, any swelling or bruising at the site, or you develop a fever > 101? F after you are discharged home, call the office at once.  SPECIAL INSTRUCTIONS You are still able to use cellular telephones; use the ear opposite the side where you have your  pacemaker/defibrillator.  Avoid carrying your cellular phone near your device. When traveling through airports, show security personnel your identification card to avoid being screened in the metal detectors.  Ask the security personnel to use the hand wand. Avoid arc welding equipment, MRI testing (magnetic resonance imaging), TENS units (transcutaneous nerve stimulators).  Call the office for questions  about other devices. Avoid electrical appliances that are in poor condition or are not properly grounded. Microwave ovens are safe to be near or to operate.  ADDITIONAL INFORMATION FOR DEFIBRILLATOR PATIENTS SHOULD YOUR DEVICE GO OFF: If your device goes off ONCE and you feel fine afterward, notify the device clinic nurses. If your device goes off ONCE and you do not feel well afterward, call 911. If your device goes off TWICE, call 911. If your device goes off THREE TIMES IN ONE DAY, call 911.  DO NOT DRIVE YOURSELF OR A FAMILY MEMBER WITH A DEFIBRILLATOR TO THE HOSPITAL--CALL 911.

## 2021-11-14 LAB — HEMOGLOBIN A1C
Est. average glucose Bld gHb Est-mCnc: 105 mg/dL
Hgb A1c MFr Bld: 5.3 % (ref 4.8–5.6)

## 2021-11-14 LAB — ACHR ABS WITH REFLEX TO MUSK: AChR Binding Ab, Serum: 0.03 nmol/L (ref 0.00–0.24)

## 2021-11-14 LAB — VITAMIN B12: Vitamin B-12: 1050 pg/mL (ref 232–1245)

## 2021-11-14 LAB — TSH: TSH: 1.82 u[IU]/mL (ref 0.450–4.500)

## 2021-11-14 LAB — MUSK ANTIBODIES: MuSK Antibodies: <1 U/mL

## 2021-12-05 ENCOUNTER — Other Ambulatory Visit: Payer: Self-pay | Admitting: Family Medicine

## 2021-12-10 DIAGNOSIS — S0083XA Contusion of other part of head, initial encounter: Secondary | ICD-10-CM | POA: Diagnosis not present

## 2021-12-10 DIAGNOSIS — S01402A Unspecified open wound of left cheek and temporomandibular area, initial encounter: Secondary | ICD-10-CM | POA: Diagnosis not present

## 2021-12-10 NOTE — Pre-Procedure Instructions (Signed)
Instructed patient on the following items: Arrival time 2:00 Nothing to eat or drink after midnight No meds AM of procedure Responsible person to drive you home and stay with you for 24 hrs Wash with special soap night before and morning of procedure

## 2021-12-11 ENCOUNTER — Other Ambulatory Visit: Payer: Self-pay

## 2021-12-11 ENCOUNTER — Ambulatory Visit (HOSPITAL_COMMUNITY)
Admission: RE | Admit: 2021-12-11 | Discharge: 2021-12-11 | Disposition: A | Discharge status: Home / Self Care | Payer: Medicare Other | Attending: Cardiology | Admitting: Cardiology

## 2021-12-11 ENCOUNTER — Encounter (HOSPITAL_COMMUNITY)
Admission: RE | Disposition: A | Discharge status: Home / Self Care | Payer: Self-pay | Source: Home / Self Care | Attending: Cardiology

## 2021-12-11 ENCOUNTER — Ambulatory Visit (HOSPITAL_COMMUNITY): Payer: Medicare Other

## 2021-12-11 DIAGNOSIS — Z87891 Personal history of nicotine dependence: Secondary | ICD-10-CM | POA: Diagnosis not present

## 2021-12-11 DIAGNOSIS — I495 Sick sinus syndrome: Secondary | ICD-10-CM | POA: Diagnosis not present

## 2021-12-11 DIAGNOSIS — I459 Conduction disorder, unspecified: Secondary | ICD-10-CM | POA: Diagnosis not present

## 2021-12-11 DIAGNOSIS — Z950 Presence of cardiac pacemaker: Secondary | ICD-10-CM

## 2021-12-11 DIAGNOSIS — I10 Essential (primary) hypertension: Secondary | ICD-10-CM | POA: Insufficient documentation

## 2021-12-11 DIAGNOSIS — G459 Transient cerebral ischemic attack, unspecified: Secondary | ICD-10-CM | POA: Insufficient documentation

## 2021-12-11 DIAGNOSIS — I442 Atrioventricular block, complete: Secondary | ICD-10-CM | POA: Insufficient documentation

## 2021-12-11 DIAGNOSIS — I4430 Unspecified atrioventricular block: Secondary | ICD-10-CM | POA: Diagnosis not present

## 2021-12-11 HISTORY — DX: Presence of cardiac pacemaker: Z95.0

## 2021-12-11 HISTORY — PX: PACEMAKER IMPLANT: EP1218

## 2021-12-11 LAB — BASIC METABOLIC PANEL
Anion gap: 7 (ref 5–15)
BUN: 16 mg/dL (ref 8–23)
CO2: 25 mmol/L (ref 22–32)
Calcium: 8.8 mg/dL — ABNORMAL LOW (ref 8.9–10.3)
Chloride: 111 mmol/L (ref 98–111)
Creatinine, Ser: 1.24 mg/dL (ref 0.61–1.24)
GFR, Estimated: >60 mL/min (ref >60)
Glucose, Bld: 98 mg/dL (ref 70–99)
Potassium: 4 mmol/L (ref 3.5–5.1)
Sodium: 143 mmol/L (ref 135–145)

## 2021-12-11 LAB — CBC
HCT: 43.8 % (ref 39.0–52.0)
Hemoglobin: 14.5 g/dL (ref 13.0–17.0)
MCH: 30.2 pg (ref 26.0–34.0)
MCHC: 33.1 g/dL (ref 30.0–36.0)
MCV: 91.3 fL (ref 80.0–100.0)
Platelets: 244 10*3/uL (ref 150–400)
RBC: 4.8 MIL/uL (ref 4.22–5.81)
RDW: 13.2 % (ref 11.5–15.5)
WBC: 4.2 10*3/uL (ref 4.0–10.5)
nRBC: 0 % (ref 0.0–0.2)

## 2021-12-11 SURGERY — PACEMAKER IMPLANT
Anesthesia: LOCAL

## 2021-12-11 MED ORDER — CHLORHEXIDINE GLUCONATE 4 % EX LIQD: 4.0000 | Freq: Once | CUTANEOUS | Status: DC

## 2021-12-11 MED ORDER — HEPARIN (PORCINE) IN NACL 1000-0.9 UT/500ML-% IV SOLN
INTRAVENOUS | Status: DC | PRN
  Administered 2021-12-11: 500 mL

## 2021-12-11 MED ORDER — SODIUM CHLORIDE 0.9 % IV SOLN: INTRAVENOUS | Status: DC

## 2021-12-11 MED ORDER — LIDOCAINE HCL (PF) 1 % IJ SOLN
INTRAMUSCULAR | Status: AC
  Filled 2021-12-11: qty 60

## 2021-12-11 MED ORDER — ACETAMINOPHEN 325 MG PO TABS: 325.0000 mg | ORAL_TABLET | ORAL | Status: DC | PRN

## 2021-12-11 MED ORDER — VANCOMYCIN HCL 1500 MG/300ML IV SOLN
1500.0000 mg | INTRAVENOUS | Status: AC
  Administered 2021-12-11: 1500 mg via INTRAVENOUS
  Filled 2021-12-11 (×2): qty 300

## 2021-12-11 MED ORDER — LIDOCAINE HCL (PF) 1 % IJ SOLN
INTRAMUSCULAR | Status: DC | PRN
  Administered 2021-12-11: 60 mL

## 2021-12-11 MED ORDER — SODIUM CHLORIDE 0.9 % IV SOLN
INTRAVENOUS | Status: AC
  Filled 2021-12-11: qty 2

## 2021-12-11 MED ORDER — ONDANSETRON HCL 4 MG/2ML IJ SOLN: 4.0000 mg | Freq: Four times a day (QID) | INTRAMUSCULAR | Status: DC | PRN

## 2021-12-11 MED ORDER — SODIUM CHLORIDE 0.9 % IV SOLN
80.0000 mg | INTRAVENOUS | Status: AC
  Administered 2021-12-11: 80 mg

## 2021-12-11 MED ORDER — FENTANYL CITRATE (PF) 100 MCG/2ML IJ SOLN
INTRAMUSCULAR | Status: DC | PRN
  Administered 2021-12-11 (×4): 25 ug via INTRAVENOUS

## 2021-12-11 MED ORDER — MIDAZOLAM HCL 5 MG/5ML IJ SOLN
INTRAMUSCULAR | Status: DC | PRN
  Administered 2021-12-11 (×4): 1 mg via INTRAVENOUS

## 2021-12-11 MED ORDER — VANCOMYCIN HCL IN DEXTROSE 1-5 GM/200ML-% IV SOLN: 1000.0000 mg | Freq: Two times a day (BID) | INTRAVENOUS | Status: DC

## 2021-12-11 MED ORDER — FENTANYL CITRATE (PF) 100 MCG/2ML IJ SOLN
INTRAMUSCULAR | Status: AC
  Filled 2021-12-11: qty 2

## 2021-12-11 MED ORDER — MIDAZOLAM HCL 5 MG/5ML IJ SOLN
INTRAMUSCULAR | Status: AC
  Filled 2021-12-11: qty 5

## 2021-12-11 SURGICAL SUPPLY — 13 items
CABLE SURGICAL S-101-97-12 (CABLE) ×1 IMPLANT
CATH RIGHTSITE C315HIS02 (CATHETERS) IMPLANT
IPG PACE AZUR XT DR MRI W1DR01 (Pacemaker) IMPLANT
LEAD CAPSURE NOVUS 5076-52CM (Lead) IMPLANT
LEAD SELECT SECURE 3830 383069 (Lead) IMPLANT
MAT PREVALON FULL STRYKER (MISCELLANEOUS) IMPLANT
PACE AZURE XT DR MRI W1DR01 (Pacemaker) ×1 IMPLANT
PAD DEFIB RADIO PHYSIO CONN (PAD) ×1 IMPLANT
SELECT SECURE 3830 383069 (Lead) ×1 IMPLANT
SHEATH 7FR PRELUDE SNAP 13 (SHEATH) IMPLANT
SLITTER 6232ADJ (MISCELLANEOUS) IMPLANT
TRAY PACEMAKER INSERTION (PACKS) ×1 IMPLANT
WIRE HI TORQ VERSACORE-J 145CM (WIRE) IMPLANT

## 2021-12-11 NOTE — Interval H&P Note (Signed)
History and Physical Interval Note:  12/11/2021 2:17 PM  Clarence Hill  has presented today for surgery, with the diagnosis of tachy/brady syndrome.  The various methods of treatment have been discussed with the patient and family. After consideration of risks, benefits and other options for treatment, the patient has consented to  Procedure(s): PACEMAKER IMPLANT (N/A) as a surgical intervention.  The patient's history has been reviewed, patient examined, no change in status, stable for surgery.  I have reviewed the patient's chart and labs.  Questions were answered to the patient's satisfaction.     Tanganika Barradas [employer]

## 2021-12-11 NOTE — Discharge Instructions (Signed)
After Your Pacemaker   You have a Medtronic Pacemaker  ACTIVITY Do not lift your arm above shoulder height for 1 week after your procedure. After 7 days, you may progress as below.  You should remove your sling 24 hours after your procedure, unless otherwise instructed by your provider.     Tuesday December 18, 2021  Wednesday December 19, 2021 Thursday December 20, 2021 Friday December 21, 2021   Do not lift, push, pull, or carry anything over 10 pounds with the affected arm until 6 weeks (Tuesday January 22, 2022 ) after your procedure.   You may drive AFTER your wound check, unless you have been told otherwise by your provider.   Ask your healthcare provider when you can go back to work   INCISION/Dressing  If large square, outer bandage is left in place, this can be removed after 24 hours from your procedure. Do not remove steri-strips or glue as below.   Monitor your Pacemaker site for redness, swelling, and drainage. Call the device clinic at [phone] if you experience these symptoms or fever/chills.     If you were discharged in a sling, please do not wear this during the day more than 48 hours after your surgery unless otherwise instructed. This may increase the risk of stiffness and soreness in your shoulder.   Avoid lotions, ointments, or perfumes over your incision until it is well-healed.  You may use a hot tub or a pool AFTER your wound check appointment if the incision is completely closed.  Pacemaker Alerts:  Some alerts are vibratory and others beep. These are NOT emergencies. Please call our office to let us know. If this occurs at night or on weekends, it can wait until the next business day. Send a remote transmission.  If your device is capable of reading fluid status (for heart failure), you will be offered monthly monitoring to review this with you.   DEVICE MANAGEMENT Remote monitoring is used to monitor your pacemaker from home. This monitoring is  scheduled every 91 days by our office. It allows [name] to keep an eye on the functioning of your device to ensure it is working properly. You will routinely see your Electrophysiologist annually (more often if necessary).   You should receive your ID card for your new device in 4-8 weeks. Keep this card with you at all times once received. Consider wearing a medical alert bracelet or necklace.  Your Pacemaker may be MRI compatible. This will be discussed at your next office visit/wound check.  You should avoid contact with strong electric or magnetic fields.   Do not use amateur (ham) radio equipment or electric (arc) welding torches. MP3 player headphones with magnets should not be used. Some devices are safe to use if held at least 12 inches (30 cm) from your Pacemaker. These include power tools, lawn mowers, and speakers. If you are unsure if something is safe to use, ask your health care provider.  When using your cell phone, hold it to the ear that is on the opposite side from the Pacemaker. Do not leave your cell phone in a pocket over the Pacemaker.  You may safely use electric blankets, heating pads, computers, and microwave ovens.  Call the office right away if: You have chest pain. You feel more short of breath than you have felt before. You feel more light-headed than you have felt before. Your incision starts to open up.  This information is not intended to replace advice given  to you by your health care provider. Make sure you discuss any questions you have with your health care provider.

## 2021-12-12 ENCOUNTER — Encounter (HOSPITAL_COMMUNITY): Payer: Self-pay | Admitting: Cardiology

## 2021-12-12 ENCOUNTER — Encounter: Payer: Self-pay | Admitting: Cardiology

## 2021-12-12 ENCOUNTER — Telehealth: Payer: Self-pay

## 2021-12-12 ENCOUNTER — Telehealth: Payer: Self-pay | Admitting: Cardiology

## 2021-12-12 NOTE — Telephone Encounter (Signed)
Radiology called [facility] regarding STAT CXR from 12/11/2021;    The results are as follows:  Narrative & Impression  CLINICAL DATA:  Status post pacemaker placement today.   EXAM: CHEST - 2 VIEW   COMPARISON:  10/13/2021   FINDINGS: Stable mildly enlarged heart and mildly tortuous thoracic aorta. Interval left subclavian bipolar transvenous pacemaker with satisfactory position of the atrial lead tip. The ventricular lead tip is projected in the region of the proximal right ventricle just beyond the expected position of the mitral valve. No pneumothorax. No acute bony abnormality.   IMPRESSION: 1. Status post left subclavian transvenous pacemaker placement with a proximal location of the right ventricular lead, as described above. 2. Stable mild cardiomegaly.   Electronically Signed: By: Claudie Revering M.D. On: 12/11/2021 19:22   I will forward this report to Dr. Curt Bears and his RN.  Received 12/12/2021 at 815am.

## 2021-12-12 NOTE — Telephone Encounter (Signed)
Follow-up after same day discharge: Implant date: 11/14/223 MD: Allegra Lai, MD Device: Medtronic [phone] Azure XT DR MRI Location: Chest   Wound check visit: 12/28/2021 @ 2:40 pm 90 day MD follow-up: 03/18/2021 @ 10:45 am  Remote Transmission received:TBD  Dressing/sling removed: Not yet, will after 7:30 pm tonight.   Confirm OAC restart on: N/A

## 2021-12-12 NOTE — Telephone Encounter (Signed)
Patient reports of feeling a "hard heart beat" intermittently. Denies any chest pain, shortness of breath or palpations. Requested patient send manual transmission. Carelink system request for patient to resend transmission in a little bit. Patient states he will resend in 30 minutes - 1 hour.  Spoke to Dr. Curt Bears about CXR and states no changes or concerns. Also advises patient is programmed MVP so he will not pace unless he needs to and it will take time for patient to get use to having the device. Patient made aware of recommendations and advised I will review transmission after received and f/u with patient. Patient voiced understanding and appreciative of call.

## 2021-12-12 NOTE — Telephone Encounter (Signed)
Remote transmission received and reviewed. Advised to continue to monitor and call if he has any further questions or concerns. Voiced understanding.

## 2021-12-12 NOTE — Telephone Encounter (Signed)
-----   Message from Shirley Friar, PA-C sent at 12/11/2021  6:00 PM EST ----- Regarding: Same day PPM Dr Curt Bears, 11/14

## 2021-12-12 NOTE — Telephone Encounter (Signed)
Diane of [facility] is calling to report STAT chest xray results.

## 2021-12-12 NOTE — Telephone Encounter (Signed)
Pt states that he is having a steady heartbeat after having Pacemaker put in yesterday. Pt says that this morning he still felt "hard like" heartbeats. Pt Pt would like a callback regarding this matter. Please advise

## 2021-12-13 ENCOUNTER — Encounter: Payer: Self-pay | Admitting: Cardiology

## 2021-12-13 ENCOUNTER — Telehealth: Payer: Self-pay | Admitting: Cardiology

## 2021-12-13 NOTE — Telephone Encounter (Signed)
Please see other patient message from 12/13/21.

## 2021-12-13 NOTE — Telephone Encounter (Signed)
Patient is concerned that he can still feel skipped beats or pauses since his PPM insertion on 10/14. He denies SOB, dizziness or chest pain.  Patient states he sent a transmission about 15:00 today. Advised I would message device clinic to make them aware and follow up with him.

## 2021-12-13 NOTE — Telephone Encounter (Signed)
Spoke to patient about remote transmission. Explained to patient that his device is working 6.3% of the time (very little) so he possibly is feeling when his pacemaker is working when needed. Advised patient it may take some time to get used to the device and how it works. Reassured patient that lead measurements are WNL and his transmission shows normal device function. Patient voiced understanding and appreciative of call. Advised patient we will wait to hear back from Dr. Curt Bears.

## 2021-12-13 NOTE — Telephone Encounter (Signed)
Patient calling to say that he can still feel his heart skip a beat, even after the monitor. Please advise

## 2021-12-13 NOTE — Telephone Encounter (Signed)
Dr. Curt Bears, could you please advise?  This gentlemen is the one I spoke to you about yesterday who had the CXR resulted in office. His device shows normal device function and no episodes noted. He is currently in SR 75 bpm. Lead measurements available are WNL.

## 2021-12-13 NOTE — Telephone Encounter (Signed)
I have forwarded this info to Dr. Curt Bears, awaiting a response. Patient called and updated to plan.

## 2021-12-28 ENCOUNTER — Ambulatory Visit: Payer: Medicare Other | Attending: Cardiovascular Disease

## 2021-12-28 ENCOUNTER — Ambulatory Visit: Payer: Medicare Other | Admitting: Family Medicine

## 2021-12-28 DIAGNOSIS — I442 Atrioventricular block, complete: Secondary | ICD-10-CM | POA: Diagnosis not present

## 2021-12-28 LAB — CUP PACEART INCLINIC DEVICE CHECK
Battery Remaining Longevity: 178 mo
Battery Voltage: 3.22 V
Brady Statistic AP VP Percent: 0.01 %
Brady Statistic AP VS Percent: 4.92 %
Brady Statistic AS VP Percent: 0.03 %
Brady Statistic AS VS Percent: 95.04 %
Brady Statistic RA Percent Paced: 4.95 %
Brady Statistic RV Percent Paced: 0.04 %
Date Time Interrogation Session: 20231201145751
Implantable Lead Connection Status: 753985
Implantable Lead Connection Status: 753985
Implantable Lead Implant Date: 20231114
Implantable Lead Implant Date: 20231114
Implantable Lead Location: 753859
Implantable Lead Location: 753860
Implantable Lead Model: 3830
Implantable Lead Model: 5076
Implantable Pulse Generator Implant Date: 20231114
Lead Channel Impedance Value: 304 Ohm
Lead Channel Impedance Value: 304 Ohm
Lead Channel Impedance Value: 380 Ohm
Lead Channel Impedance Value: 551 Ohm
Lead Channel Pacing Threshold Amplitude: 0.5 V
Lead Channel Pacing Threshold Amplitude: 1.125 V
Lead Channel Pacing Threshold Pulse Width: 0.4 ms
Lead Channel Pacing Threshold Pulse Width: 0.4 ms
Lead Channel Sensing Intrinsic Amplitude: 4 mV
Lead Channel Sensing Intrinsic Amplitude: 4.75 mV
Lead Channel Sensing Intrinsic Amplitude: 5.375 mV
Lead Channel Sensing Intrinsic Amplitude: 5.625 mV
Lead Channel Setting Pacing Amplitude: 3.5 V
Lead Channel Setting Pacing Amplitude: 3.5 V
Lead Channel Setting Pacing Pulse Width: 0.4 ms
Lead Channel Setting Sensing Sensitivity: 1.2 mV
Zone Setting Status: 755011

## 2021-12-28 NOTE — Progress Notes (Signed)
Wound check appointment. Steri-strips removed. Wound without redness or edema. Incision edges approximated, wound well healed. Normal device function. Thresholds, sensing, and impedances consistent with implant measurements. Device programmed at 3.5Vextra safety margin until 3 month visit. Histogram distribution appropriate for patient and level of activity. 5 SVT episodes longest episode 57seconds in duration, rates 154/154bpm. Due to patients c/o of "skipped beats and hard heart beats" in epic phone calls/ patient messages rate lowered to 50bpm and adaptive capture management turned off on RA and RV leads, as patient only atrially paces 4.9% of the time. Patient educated about wound care, arm mobility, lifting restrictions. ROV with WC 03/18/21

## 2021-12-28 NOTE — Patient Instructions (Addendum)
   After Your Pacemaker   Monitor your pacemaker site for redness, swelling, and drainage. Call the device clinic at [phone] if you experience these symptoms or fever/chills.  Your incision was closed with Steri-strips or staples:  You may shower 7 days after your procedure and wash your incision with soap and water. Avoid lotions, ointments, or perfumes over your incision until it is well-healed.  You may use a hot tub or a pool after your wound check appointment if the incision is completely closed.  Do not lift, push or pull greater than 10 pounds with the affected arm until 01/22/2022 There are no other restrictions in arm movement after your wound check appointment.  You may drive, unless driving has been restricted by your healthcare providers.   Remote monitoring is used to monitor your pacemaker from home. This monitoring is scheduled every 91 days by our office. It allows [name] to keep an eye on the functioning of your device to ensure it is working properly. You will routinely see your Electrophysiologist annually (more often if necessary).

## 2022-01-07 ENCOUNTER — Other Ambulatory Visit: Payer: Self-pay

## 2022-01-07 MED ORDER — ZOLPIDEM TARTRATE 10 MG PO TABS: 10.0000 mg | ORAL_TABLET | Freq: Every evening | ORAL | 5 refills | Status: DC | PRN

## 2022-01-11 ENCOUNTER — Encounter: Payer: Self-pay | Admitting: Cardiology

## 2022-01-11 MED ORDER — METOPROLOL SUCCINATE ER 50 MG PO TB24: 50.0000 mg | ORAL_TABLET | Freq: Every day | ORAL | 2 refills | Status: DC

## 2022-01-11 NOTE — Telephone Encounter (Signed)
Reviewed with Dr. Curt Bears.   He would like to start patient on Toprol XL '50mg'$  1 po qd and continue to follow him remotely.  I spoke with patient to go over new med start with him.  Patient states that he has taken the medication recently in the past. (Metoprolol succinate '50mg'$ ).   He took it back in August and ended up in the ER in September with palpitations, PVC's, chest pain and dizziness.  He attributed this to the only change he had made which was starting the metoprolol.  But, not sure that was the case and is open to trying the metoprolol again.   Patient will start the metoprolol tonight before bed as we discussed.   I reviewed Metoprolol re-trial with Dr. Lovena Le as Dr. Curt Bears is out of the office now.   Dr. Lovena Le agreed that patient should retry the metoprolol (and continue with his other medications, of particular note the amlodipine). as this will be best medication to help with his palpitation type symptoms.

## 2022-01-11 NOTE — Telephone Encounter (Signed)
Patient has been symptomatic frequently since implant on 12/11/21.  C/o: feeling "skipped' and strong beats, HA and feeling faint-like, ongoing fatigue.   We saw him on 12/1 for wound check appt and adjusted the following: LRL changed from 60 to 50bpm Atrial /RV cap management : turned adaptive off PACED AV: changed from 264m to 3055mSensed AV: changed from 22025mo 300m78mthere are 6 recorded svt events from 12/2 to present. (See below)  Patient wants us t[place] that he continues to feel poorly and is leaving tomorrow to go out of town until 12/26.  Would like for us tKoreaconsider if anything further can be done?  He doesn't understand why he continue to feel bad.   Forwarding to Dr. CamnCurt Bearsreview and make any recommendations.     MOST RECENT: TODAY 9:40AM:

## 2022-01-17 ENCOUNTER — Other Ambulatory Visit: Payer: Self-pay

## 2022-01-18 MED ORDER — ZOLPIDEM TARTRATE 10 MG PO TABS: 10.0000 mg | ORAL_TABLET | Freq: Every evening | ORAL | 5 refills | Status: DC | PRN

## 2022-01-28 DIAGNOSIS — M5430 Sciatica, unspecified side: Secondary | ICD-10-CM

## 2022-01-28 HISTORY — DX: Sciatica, unspecified side: M54.30

## 2022-02-06 ENCOUNTER — Encounter: Payer: Self-pay | Admitting: Family Medicine

## 2022-02-07 NOTE — Telephone Encounter (Signed)
I called the patient. He would like to see Dr. Tobie Poet. I have rescheduled the appointment to Monday, January 15 at 3:40 pm.

## 2022-02-11 ENCOUNTER — Ambulatory Visit (INDEPENDENT_AMBULATORY_CARE_PROVIDER_SITE_OTHER): Payer: Medicare Other | Admitting: Family Medicine

## 2022-02-11 ENCOUNTER — Encounter: Payer: Self-pay | Admitting: Family Medicine

## 2022-02-11 VITALS — BP 118/70 | HR 72 | Resp 15 | Ht 72.0 in | Wt 233.0 lb

## 2022-02-11 DIAGNOSIS — R252 Cramp and spasm: Secondary | ICD-10-CM | POA: Diagnosis not present

## 2022-02-11 DIAGNOSIS — M48061 Spinal stenosis, lumbar region without neurogenic claudication: Secondary | ICD-10-CM

## 2022-02-11 MED ORDER — MELOXICAM 7.5 MG PO TABS: 7.5000 mg | ORAL_TABLET | Freq: Two times a day (BID) | ORAL | 0 refills | Status: DC

## 2022-02-11 NOTE — Patient Instructions (Addendum)
Start on meloxicam 7.5 mg one twice daily x 2 weeks. Start tizanidine 2 mg one at night as needed.  Try mustard for cramps in legs.  Get lumbar xray if not improving.

## 2022-02-11 NOTE — Progress Notes (Signed)
Acute Office Visit  Subjective:    Patient ID: Clarence Hill, male    DOB: 09-05-1945, 77 y.o.   MRN: [id number]  Chief Complaint  Patient presents with   muscle cramps   Back Pain   HPI: Patient is in today for back pain since 2 weeks ago with radiation to both buttocks and both legs. It hurts to walk. Resolves when he sits.  He also has cramps in both legs. Leaning over a buggy helps.  Pacemaker placed in 12/11/2021. Healed well. It is being monitored. Started on metoprolol.   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   Left lower quadrant pain 01/26/2020   Nodule of finger of left hand 01/14/2018   Palpitations 05/12/2017   Proud flesh 09/15/2019   Retinal edema 02/09/2019   S/P cataract extraction and insertion of intraocular lens, left 02/09/2019   Thrombosed external hemorrhoid 12/18/2017   TIA (transient ischemic attack)    Vitreous debris 02/09/2019    Past Surgical History:  Procedure Laterality Date   APPENDECTOMY     CARDIAC CATHETERIZATION     CATARACT EXTRACTION     EYE SURGERY Right    Laser   PACEMAKER IMPLANT N/A 12/11/2021   Procedure: PACEMAKER IMPLANT;  Surgeon: Constance Haw, MD;  Location: [facility];  Service: Cardiovascular;  Laterality: N/A;    Family History  Problem Relation Age of Onset   Thyroid cancer Mother    CAD Father    Breast cancer Sister    Renal cancer Sister    CAD Paternal Uncle     Social History   Socioeconomic History   Marital status: Married    Spouse name: Not on  file   Number of children: 2   Years of education: Not on file   Highest education level: Not on file  Occupational History   Not on file  Tobacco Use   Smoking status: Former    Packs/day: 1.00    Types: Cigarettes    Quit date: 1976    Years since quitting: 48.0   Smokeless tobacco: Never  Vaping Use   Vaping Use: Never used  Substance and Sexual Activity   Alcohol use: Yes    Alcohol/week: 5.0 standard drinks of alcohol    Types: 5 Glasses of wine per week   Drug use: Never   Sexual activity: Yes    Partners: Female  Other Topics Concern   Not on file  Social History Narrative   Lives with wife   Social Determinants of Health   Financial Resource Strain: Not on file  Food Insecurity: Not on file  Transportation Needs: Not on file  Physical Activity: Not on file  Stress: Not on file  Social Connections: Not on file  Intimate Partner Violence: Not on file    Outpatient Medications Prior to Visit  Medication Sig Dispense Refill   allopurinol (ZYLOPRIM) 100 MG tablet TAKE 1 TABLET BY MOUTH EVERY DAY 90 tablet 1   amLODipine (NORVASC) 10 MG tablet Take 1 tablet (10 mg  total) by mouth daily. 90 tablet 3   aspirin EC 81 MG tablet Take 81 mg by mouth daily.     colchicine 0.6 MG tablet TAKE ONE TABLET BY MOUTH DAILY AS NEEDED DURING GOUT FLARE 30 tablet 0   diphenhydramine-acetaminophen (TYLENOL PM) 25-500 MG TABS tablet Take 2 tablets by mouth at bedtime as needed (sleep).     fexofenadine (ALLEGRA HIVES 24HR) 180 MG tablet Take 180 mg by mouth daily.     metoprolol succinate (TOPROL XL) 50 MG 24 hr tablet Take 1 tablet (50 mg total) by mouth daily. Take with a snack at bedtime. 30 tablet 2   Multiple Vitamins-Minerals (MULTIVITAMIN) LIQD Take 1 tablet by mouth daily. Unknown strength     nystatin-triamcinolone (MYCOLOG II) cream Apply 1 application. topically 2 (two) times daily. 30 g 0   pantoprazole (PROTONIX) 40 MG tablet Take 1 tablet (40 mg total) by mouth daily. 90  tablet 3   pravastatin (PRAVACHOL) 10 MG tablet TAKE 1 TABLET BY MOUTH AT BEDTIME 90 tablet 1   tiZANidine (ZANAFLEX) 2 MG tablet Take 1 tablet (2 mg total) by mouth every 8 (eight) hours as needed for muscle spasms. 30 tablet 0   vitamin B-12 (CYANOCOBALAMIN) 1000 MCG tablet Take 1,000 mcg by mouth daily.     zolpidem (AMBIEN) 10 MG tablet Take 1 tablet (10 mg total) by mouth at bedtime as needed for sleep. 30 tablet 5   No facility-administered medications prior to visit.    Allergies  Allergen Reactions   Levofloxacin Other (See Comments)    Unknown reaction   Penicillins     Unknown reaction   Pepcid [Famotidine] Cough   Penicillin G Benzathine & Proc Rash    Review of Systems  Constitutional:  Negative for chills, fatigue, fever and unexpected weight change.  HENT:  Negative for congestion, ear pain, sinus pain and sore throat.   Respiratory:  Negative for cough and shortness of breath.   Cardiovascular:  Negative for chest pain and palpitations.  Gastrointestinal:  Negative for abdominal pain, blood in stool, constipation, diarrhea, nausea and vomiting.  Endocrine: Negative for polydipsia.  Genitourinary:  Negative for dysuria.  Musculoskeletal:  Positive for back pain and myalgias.  Skin:  Negative for rash.  Neurological:  Negative for headaches.       Objective:    Physical Exam Vitals reviewed.  Constitutional:      Appearance: Normal appearance.  Neck:     Vascular: No carotid bruit.  Cardiovascular:     Rate and Rhythm: Normal rate and regular rhythm.     Heart sounds: Normal heart sounds.  Pulmonary:     Effort: Pulmonary effort is normal.     Breath sounds: Normal breath sounds. No wheezing, rhonchi or rales.  Abdominal:     General: Bowel sounds are normal.     Palpations: Abdomen is soft.     Tenderness: There is no abdominal tenderness.  Musculoskeletal:        General: No tenderness (no lumbar tenderness).     Comments: Neg SLR BL.   Neurological:     Mental Status: He is alert and oriented to person, place, and time.  Psychiatric:        Mood and Affect: Mood normal.        Behavior: Behavior normal.     BP 118/70   Pulse 72   Resp 15   Ht 6' (1.829 m)   Wt 233 lb (105.7 kg)   SpO2  95%   BMI 31.60 kg/m  Wt Readings from Last 3 Encounters:  02/11/22 233 lb (105.7 kg)  12/11/21 235 lb (106.6 kg)  11/12/21 234 lb (106.1 kg)    Health Maintenance Due  Topic Date Due   Zoster Vaccines- Shingrix (1 of 2) Never done   Medicare Annual Wellness (AWV)  05/15/2021    There are no preventive care reminders to display for this patient.   Lab Results  Component Value Date   TSH 2.360 02/11/2022   Lab Results  Component Value Date   WBC 4.2 12/11/2021   HGB 14.5 12/11/2021   HCT 43.8 12/11/2021   MCV 91.3 12/11/2021   PLT 244 12/11/2021   Lab Results  Component Value Date   NA 135 02/11/2022   K 4.5 02/11/2022   CO2 22 02/11/2022   GLUCOSE 101 (H) 02/11/2022   BUN 19 02/11/2022   CREATININE 1.27 02/11/2022   BILITOT 0.4 02/11/2022   ALKPHOS 70 02/11/2022   AST 18 02/11/2022   ALT 22 02/11/2022   PROT 6.7 02/11/2022   ALBUMIN 4.3 02/11/2022   CALCIUM 9.5 02/11/2022   ANIONGAP 7 12/11/2021   EGFR 59 (L) 02/11/2022   Lab Results  Component Value Date   CHOL 124 07/16/2021   Lab Results  Component Value Date   HDL 46 07/16/2021   Lab Results  Component Value Date   LDLCALC 55 07/16/2021   Lab Results  Component Value Date   TRIG 131 07/16/2021   Lab Results  Component Value Date   CHOLHDL 2.7 07/16/2021   Lab Results  Component Value Date   HGBA1C 5.3 10/31/2021       Assessment & Plan:  Favor was seen today for muscle cramps and back pain.  Muscle cramps Assessment & Plan: Start tizanidine 2 mg one at night as needed.  Try mustard for cramps in legs. Check labs    Orders: -     Comprehensive metabolic panel -     TSH -     Phosphorus -     Magnesium  Spinal  stenosis of lumbar region without neurogenic claudication Assessment & Plan: Start on meloxicam 7.5 mg one twice daily x 2 weeks. Start tizanidine 2 mg one at night as needed.  Get lumbar xray if not improving.   Orders: -     Meloxicam; Take 1 tablet (7.5 mg total) by mouth 2 (two) times daily. Take with food.  Dispense: 60 tablet; Refill: 0 -     DG Lumbar Spine Complete; Future    Meds ordered this encounter  Medications   meloxicam (MOBIC) 7.5 MG tablet    Sig: Take 1 tablet (7.5 mg total) by mouth 2 (two) times daily. Take with food.    Dispense:  60 tablet    Refill:  0    Orders Placed This Encounter  Procedures   DG Lumbar Spine Complete   Comprehensive metabolic panel   TSH   Phosphorus   Magnesium     Follow-up: Return in about 6 weeks (around 03/25/2022) for Neil Crouch, NP.  An After Visit Summary was printed and given to the patient.  Thompson Caul, CMA, acting as a scribe for [name], MD.,have documented all relevant documentation on the behalf of [name], MD,as directed by  [name], MD while in the presence of [name], MD.    [name], MD [place] [phone]

## 2022-02-12 LAB — COMPREHENSIVE METABOLIC PANEL
ALT: 22 IU/L (ref 0–44)
AST: 18 IU/L (ref 0–40)
Albumin/Globulin Ratio: 1.8 (ref 1.2–2.2)
Albumin: 4.3 g/dL (ref 3.8–4.8)
Alkaline Phosphatase: 70 IU/L (ref 44–121)
BUN/Creatinine Ratio: 15 (ref 10–24)
BUN: 19 mg/dL (ref 8–27)
Bilirubin Total: 0.4 mg/dL (ref 0.0–1.2)
CO2: 22 mmol/L (ref 20–29)
Calcium: 9.5 mg/dL (ref 8.6–10.2)
Chloride: 109 mmol/L — ABNORMAL HIGH (ref 96–106)
Creatinine, Ser: 1.27 mg/dL (ref 0.76–1.27)
Globulin, Total: 2.4 g/dL (ref 1.5–4.5)
Glucose: 101 mg/dL — ABNORMAL HIGH (ref 70–99)
Potassium: 4.5 mmol/L (ref 3.5–5.2)
Sodium: 135 mmol/L (ref 134–144)
Total Protein: 6.7 g/dL (ref 6.0–8.5)
eGFR: 59 mL/min/{1.73_m2} — ABNORMAL LOW (ref >59)

## 2022-02-12 LAB — TSH: TSH: 2.36 u[IU]/mL (ref 0.450–4.500)

## 2022-02-12 LAB — PHOSPHORUS: Phosphorus: 3.4 mg/dL (ref 2.8–4.1)

## 2022-02-12 LAB — MAGNESIUM: Magnesium: 2.2 mg/dL (ref 1.6–2.3)

## 2022-02-17 DIAGNOSIS — R252 Cramp and spasm: Secondary | ICD-10-CM

## 2022-02-17 DIAGNOSIS — M48061 Spinal stenosis, lumbar region without neurogenic claudication: Secondary | ICD-10-CM | POA: Insufficient documentation

## 2022-02-17 HISTORY — DX: Cramp and spasm: R25.2

## 2022-02-17 HISTORY — DX: Spinal stenosis, lumbar region without neurogenic claudication: M48.061

## 2022-02-17 NOTE — Assessment & Plan Note (Signed)
Start on meloxicam 7.5 mg one twice daily x 2 weeks. Start tizanidine 2 mg one at night as needed.  Get lumbar xray if not improving.

## 2022-02-17 NOTE — Assessment & Plan Note (Signed)
Start tizanidine 2 mg one at night as needed.  Try mustard for cramps in legs. Check labs

## 2022-02-18 DIAGNOSIS — H439 Unspecified disorder of vitreous body: Secondary | ICD-10-CM | POA: Diagnosis not present

## 2022-02-18 DIAGNOSIS — H3581 Retinal edema: Secondary | ICD-10-CM | POA: Diagnosis not present

## 2022-02-18 DIAGNOSIS — H209 Unspecified iridocyclitis: Secondary | ICD-10-CM | POA: Diagnosis not present

## 2022-02-18 DIAGNOSIS — Z9842 Cataract extraction status, left eye: Secondary | ICD-10-CM | POA: Diagnosis not present

## 2022-02-18 DIAGNOSIS — H348312 Tributary (branch) retinal vein occlusion, right eye, stable: Secondary | ICD-10-CM | POA: Diagnosis not present

## 2022-02-18 DIAGNOSIS — Z961 Presence of intraocular lens: Secondary | ICD-10-CM | POA: Diagnosis not present

## 2022-02-26 ENCOUNTER — Encounter: Payer: Self-pay | Admitting: Family Medicine

## 2022-02-27 ENCOUNTER — Encounter: Payer: Self-pay | Admitting: Family Medicine

## 2022-02-27 NOTE — Telephone Encounter (Signed)
Provider spoke with patient

## 2022-03-04 ENCOUNTER — Ambulatory Visit: Payer: Medicare Other | Admitting: Family Medicine

## 2022-03-06 DIAGNOSIS — M48062 Spinal stenosis, lumbar region with neurogenic claudication: Secondary | ICD-10-CM | POA: Diagnosis not present

## 2022-03-07 ENCOUNTER — Other Ambulatory Visit (HOSPITAL_COMMUNITY): Payer: Self-pay | Admitting: Neurosurgery

## 2022-03-07 DIAGNOSIS — M48062 Spinal stenosis, lumbar region with neurogenic claudication: Secondary | ICD-10-CM

## 2022-03-13 ENCOUNTER — Ambulatory Visit: Payer: Medicare Other

## 2022-03-13 DIAGNOSIS — I442 Atrioventricular block, complete: Secondary | ICD-10-CM

## 2022-03-13 DIAGNOSIS — M6281 Muscle weakness (generalized): Secondary | ICD-10-CM | POA: Diagnosis not present

## 2022-03-13 DIAGNOSIS — M545 Low back pain, unspecified: Secondary | ICD-10-CM | POA: Diagnosis not present

## 2022-03-13 LAB — CUP PACEART REMOTE DEVICE CHECK
Battery Remaining Longevity: 180 mo
Battery Voltage: 3.2 V
Brady Statistic AP VP Percent: 0 %
Brady Statistic AP VS Percent: 0.84 %
Brady Statistic AS VP Percent: 0 %
Brady Statistic AS VS Percent: 99.16 %
Brady Statistic RA Percent Paced: 0.83 %
Brady Statistic RV Percent Paced: 0 %
Date Time Interrogation Session: 20240214084100
Implantable Lead Connection Status: 753985
Implantable Lead Connection Status: 753985
Implantable Lead Implant Date: 20231114
Implantable Lead Implant Date: 20231114
Implantable Lead Location: 753859
Implantable Lead Location: 753860
Implantable Lead Model: 3830
Implantable Lead Model: 5076
Implantable Pulse Generator Implant Date: 20231114
Lead Channel Impedance Value: 304 Ohm
Lead Channel Impedance Value: 323 Ohm
Lead Channel Impedance Value: 437 Ohm
Lead Channel Impedance Value: 494 Ohm
Lead Channel Pacing Threshold Amplitude: 0.5 V
Lead Channel Pacing Threshold Amplitude: 1.125 V
Lead Channel Pacing Threshold Pulse Width: 0.4 ms
Lead Channel Pacing Threshold Pulse Width: 0.4 ms
Lead Channel Sensing Intrinsic Amplitude: 3.375 mV
Lead Channel Sensing Intrinsic Amplitude: 3.375 mV
Lead Channel Sensing Intrinsic Amplitude: 4.625 mV
Lead Channel Sensing Intrinsic Amplitude: 4.625 mV
Lead Channel Setting Pacing Amplitude: 3.5 V
Lead Channel Setting Pacing Amplitude: 3.5 V
Lead Channel Setting Pacing Pulse Width: 0.4 ms
Lead Channel Setting Sensing Sensitivity: 1.2 mV
Zone Setting Status: 755011

## 2022-03-15 ENCOUNTER — Other Ambulatory Visit: Payer: Self-pay | Admitting: Family Medicine

## 2022-03-15 DIAGNOSIS — M48061 Spinal stenosis, lumbar region without neurogenic claudication: Secondary | ICD-10-CM

## 2022-03-15 DIAGNOSIS — M6281 Muscle weakness (generalized): Secondary | ICD-10-CM | POA: Diagnosis not present

## 2022-03-15 DIAGNOSIS — M545 Low back pain, unspecified: Secondary | ICD-10-CM | POA: Diagnosis not present

## 2022-03-18 ENCOUNTER — Ambulatory Visit: Payer: Medicare Other | Attending: Cardiology | Admitting: Cardiology

## 2022-03-18 ENCOUNTER — Encounter: Payer: Self-pay | Admitting: Cardiology

## 2022-03-18 VITALS — BP 120/84 | HR 64 | Ht 72.0 in | Wt 237.0 lb

## 2022-03-18 DIAGNOSIS — I442 Atrioventricular block, complete: Secondary | ICD-10-CM | POA: Diagnosis not present

## 2022-03-18 DIAGNOSIS — T82110A Breakdown (mechanical) of cardiac electrode, initial encounter: Secondary | ICD-10-CM | POA: Diagnosis not present

## 2022-03-18 DIAGNOSIS — I10 Essential (primary) hypertension: Secondary | ICD-10-CM | POA: Insufficient documentation

## 2022-03-18 NOTE — Progress Notes (Signed)
Electrophysiology Office Note   Date:  03/18/2022   ID:  Roma Khosla, DOB 07-Aug-1945, MRN [id number]  PCP:  [name], MD  Cardiologist:  Agustin Cree Primary Electrophysiologist:  Constance Haw, MD    Chief Complaint: heart block   History of Present Illness: Clarence Hill is a 77 y.o. male who is being seen today for the evaluation of heart block at the request of Cox, Elnita Maxwell, MD. Presenting today for electrophysiology evaluation.  He has a history significant for PVCs, hypertension, dizziness, atypical chest pain.  He went to [facility] after an episode of double vision while driving.  This subsided but he continued to feel poorly.  When he was in the emergency room he may have had a TIA.  He wore a cardiac monitor and had dizziness and feeling like a pause in his heart rhythm.  He had a 5.23-second pause.  He is now status post Medtronic dual-chamber pacemaker implanted 12/11/2021.  Today, denies symptoms of palpitations, chest pain, shortness of breath, orthopnea, PND, lower extremity edema, claudication, dizziness, presyncope, syncope, bleeding, or neurologic sequela. The patient is tolerating medications without difficulties.      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   Left lower quadrant pain 01/26/2020   Nodule of finger of left hand 01/14/2018   Palpitations 05/12/2017   Proud flesh 09/15/2019   Retinal edema 02/09/2019   S/P cataract extraction and insertion of intraocular lens, left 02/09/2019   Thrombosed  external hemorrhoid 12/18/2017   TIA (transient ischemic attack)    Vitreous debris 02/09/2019   Past Surgical History:  Procedure Laterality Date   APPENDECTOMY     CARDIAC CATHETERIZATION     CATARACT EXTRACTION     EYE SURGERY Right    Laser   PACEMAKER IMPLANT N/A 12/11/2021   Procedure: PACEMAKER IMPLANT;  Surgeon: Constance Haw, MD;  Location: [facility];  Service: Cardiovascular;  Laterality: N/A;     Current Outpatient Medications  Medication Sig Dispense Refill   allopurinol (ZYLOPRIM) 100 MG tablet TAKE 1 TABLET BY MOUTH EVERY DAY 90 tablet 1   amLODipine (NORVASC) 10 MG tablet Take 1 tablet (10 mg total) by mouth daily. 90 tablet 3   aspirin EC 81 MG tablet Take 81 mg by mouth daily.     colchicine 0.6 MG tablet TAKE ONE TABLET BY MOUTH DAILY AS NEEDED DURING GOUT FLARE 30 tablet 0   diphenhydramine-acetaminophen (TYLENOL PM) 25-500 MG TABS tablet Take 2 tablets by mouth at bedtime as needed (sleep).     fexofenadine (ALLEGRA HIVES 24HR) 180 MG tablet Take 180 mg by mouth daily.     meloxicam (MOBIC) 7.5 MG tablet TAKE 1 TABLET (7.5 MG TOTAL) BY MOUTH 2 (TWO) TIMES DAILY. TAKE WITH FOOD. 60 tablet 3   metoprolol succinate (TOPROL XL) 50 MG 24 hr tablet Take 1 tablet (50 mg total) by mouth daily. Take with a snack at bedtime. 30 tablet 2   Multiple Vitamins-Minerals (MULTIVITAMIN) LIQD Take 1 tablet by mouth daily. Unknown strength  nystatin-triamcinolone (MYCOLOG II) cream Apply 1 application. topically 2 (two) times daily. 30 g 0   pantoprazole (PROTONIX) 40 MG tablet Take 1 tablet (40 mg total) by mouth daily. 90 tablet 3   pravastatin (PRAVACHOL) 10 MG tablet TAKE 1 TABLET BY MOUTH AT BEDTIME 90 tablet 1   vitamin B-12 (CYANOCOBALAMIN) 1000 MCG tablet Take 1,000 mcg by mouth daily.     zolpidem (AMBIEN) 10 MG tablet Take 1 tablet (10 mg total) by mouth at bedtime as needed for sleep. 30 tablet 5   No current facility-administered medications for this  visit.    Allergies:   Prednisone, Levofloxacin, Penicillins, Pepcid [famotidine], and Penicillin g benzathine & proc   Social History:  The patient  reports that he quit smoking about 48 years ago. His smoking use included cigarettes. He smoked an average of 1 pack per day. He has never used smokeless tobacco. He reports current alcohol use of about 5.0 standard drinks of alcohol per week. He reports that he does not use drugs.   Family History:  The patient's family history includes Breast cancer in his sister; CAD in his father and paternal uncle; Renal cancer in his sister; Thyroid cancer in his mother.    ROS:  Please see the history of present illness.   Otherwise, review of systems is positive for none.   All other systems are reviewed and negative.   PHYSICAL EXAM: VS:  BP 120/84   Pulse 64   Ht 6' (1.829 m)   Wt 237 lb (107.5 kg)   SpO2 93%   BMI 32.14 kg/m  , BMI Body mass index is 32.14 kg/m. GEN: Well nourished, well developed, in no acute distress  HEENT: normal  Neck: no JVD, carotid bruits, or masses Cardiac: RRR; no murmurs, rubs, or gallops,no edema  Respiratory:  clear to auscultation bilaterally, normal work of breathing GI: soft, nontender, nondistended, + BS MS: no deformity or atrophy  Skin: warm and dry, device site well healed Neuro:  Strength and sensation are intact Psych: euthymic mood, full affect  EKG:  EKG is ordered today. Personal review of the ekg ordered shows SR  Personal review of the device interrogation today. Results in [place]: 12/11/2021: Hemoglobin 14.5; Platelets 244 02/11/2022: ALT 22; BUN 19; Creatinine, Ser 1.27; Magnesium 2.2; Potassium 4.5; Sodium 135; TSH 2.360    Lipid Panel     Component Value Date/Time   CHOL 124 07/16/2021 1431   TRIG 131 07/16/2021 1431   HDL 46 07/16/2021 1431   CHOLHDL 2.7 07/16/2021 1431   LDLCALC 55 07/16/2021 1431     Wt Readings from Last 3 Encounters:  03/18/22 237 lb  (107.5 kg)  02/11/22 233 lb (105.7 kg)  12/11/21 235 lb (106.6 kg)      Other studies Reviewed: Additional studies/ records that were reviewed today include: TTE 10/14/21 Review of the above records today demonstrates:  Ejection fraction 65% Impaired relaxation pattern Mild mitral regurgitation Mild aortic regurgitation   ASSESSMENT AND PLAN:  1.  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  He is now status post Tronic dual-chamber pacemaker implanted 12/11/2021.  The lead threshold and sensing have changed.  Threshold is now 5 V at 1 ms.  Thing is reduced.  Rishith Siddoway get a chest x-ray to ensure the lead has not dislodged.  Despite this, he is pacing less than 0.1% of the time.  Anaika Santillano likely continue to monitor  if the lead has remained in stable position.  2.  Hypertension: Currently well-controlled  3.  TIA: Monitoring for atrial fibrillation through pacemaker.   Current medicines are reviewed at length with the patient today.   The patient does not have concerns regarding his medicines.  The following changes were made today:  none  Labs/ tests ordered today include:  Orders Placed This Encounter  Procedures   DG Chest 2 View   EKG 12-Lead     Disposition:   FU 12 months  Signed, Alacia Rehmann Meredith Leeds, MD  03/18/2022 11:26 AM     [facility] [address] [place] [address] [phone] (office) [phone] (fax)

## 2022-03-18 NOTE — Patient Instructions (Addendum)
Medication Instructions:  Your physician recommends that you continue on your current medications as directed. Please refer to the Current Medication list given to you today.  *If you need a refill on your cardiac medications before your next appointment, please call your pharmacy*   Lab Work: None ordered   Testing/Procedures: A chest x-ray takes a picture of the organs and structures inside the chest, including the heart, lungs, and blood vessels. This test can show several things, including, whether the heart is enlarges; whether fluid is building up in the lungs; and whether pacemaker / defibrillator leads are still in place.  At [place], [place]   Follow-Up: At [facility], you and your health needs are our priority.  As part of our continuing mission to provide you with exceptional heart care, we have created designated Provider Care Teams.  These Care Teams include your primary Cardiologist (physician) and Advanced Practice Providers (APPs -  Physician Assistants and Nurse Practitioners) who all work together to provide you with the care you need, when you need it.  Remote monitoring is used to monitor your Pacemaker or ICD from home. This monitoring reduces the number of office visits required to check your device to one time per year. It allows [name] to keep an eye on the functioning of your device to ensure it is working properly. You are scheduled for a device check from home on 06/12/2022. You may send your transmission at any time that day. If you have a wireless device, the transmission will be sent automatically. After your physician reviews your transmission, you will receive a postcard with your next transmission date.  Your next appointment:   To be determined after chest x-ray  The format for your next appointment:   In Person  Provider:   Allegra Lai, MD    Thank you for choosing [facility]!!   Trinidad Curet, RN [phone]

## 2022-03-22 DIAGNOSIS — M6281 Muscle weakness (generalized): Secondary | ICD-10-CM | POA: Diagnosis not present

## 2022-03-22 DIAGNOSIS — M545 Low back pain, unspecified: Secondary | ICD-10-CM | POA: Diagnosis not present

## 2022-03-26 ENCOUNTER — Other Ambulatory Visit: Payer: Self-pay

## 2022-03-26 MED ORDER — PRAVASTATIN SODIUM 10 MG PO TABS: 10.0000 mg | ORAL_TABLET | Freq: Every day | ORAL | 1 refills | Status: DC

## 2022-03-28 ENCOUNTER — Encounter: Payer: Self-pay | Admitting: Cardiology

## 2022-03-28 DIAGNOSIS — M545 Low back pain, unspecified: Secondary | ICD-10-CM | POA: Diagnosis not present

## 2022-03-28 DIAGNOSIS — M6281 Muscle weakness (generalized): Secondary | ICD-10-CM | POA: Diagnosis not present

## 2022-04-01 ENCOUNTER — Encounter: Payer: Self-pay | Admitting: Nurse Practitioner

## 2022-04-01 ENCOUNTER — Ambulatory Visit (INDEPENDENT_AMBULATORY_CARE_PROVIDER_SITE_OTHER): Payer: Medicare Other | Admitting: Nurse Practitioner

## 2022-04-01 VITALS — BP 120/70 | HR 72 | Temp 97.2°F | Resp 18 | Ht 72.0 in | Wt 235.0 lb

## 2022-04-01 DIAGNOSIS — M48061 Spinal stenosis, lumbar region without neurogenic claudication: Secondary | ICD-10-CM

## 2022-04-01 MED ORDER — TIZANIDINE HCL 2 MG PO TABS: 2.0000 mg | ORAL_TABLET | Freq: Four times a day (QID) | ORAL | 0 refills | Status: DC | PRN

## 2022-04-01 NOTE — Patient Instructions (Signed)
Follow up as needed

## 2022-04-01 NOTE — Progress Notes (Signed)
Subjective:  Patient ID: Clarence Hill, male    DOB: 1945/10/14  Age: 77 y.o. MRN: [id number]  CHIEF COMPLAINT: Follow up for the spinal stenosis of lumbar region without neurogenic claudication   History of Present illness: Patient is here for a regular follow up for the spinal stenosis of lumbar region without neurogenic claudication. He said he is waiting to done his MRI which is with [facility] which is scheduled for March 27 and after that he is going to see Neurologist with MRI result at April first. Patient is taking Meloxicam 7.5 mg BD and said it is helping him. He never got muscle relaxant from pharmacy. He is doing regular physical therapy and back exercise, he is also using a heating pad.   Patient said he is going to [place] tomorrow for his pacer wire checkup for his pacemaker. He denies SHORTNESS OF BREATH, chest pain, distress and palpitation.   Current Outpatient Medications on File Prior to Visit  Medication Sig Dispense Refill   allopurinol (ZYLOPRIM) 100 MG tablet TAKE 1 TABLET BY MOUTH EVERY DAY 90 tablet 1   amLODipine (NORVASC) 10 MG tablet Take 1 tablet (10 mg total) by mouth daily. 90 tablet 3   aspirin EC 81 MG tablet Take 81 mg by mouth daily.     colchicine 0.6 MG tablet TAKE ONE TABLET BY MOUTH DAILY AS NEEDED DURING GOUT FLARE 30 tablet 0   diphenhydramine-acetaminophen (TYLENOL PM) 25-500 MG TABS tablet Take 2 tablets by mouth at bedtime as needed (sleep).     fexofenadine (ALLEGRA HIVES 24HR) 180 MG tablet Take 180 mg by mouth daily.     meloxicam (MOBIC) 7.5 MG tablet TAKE 1 TABLET (7.5 MG TOTAL) BY MOUTH 2 (TWO) TIMES DAILY. TAKE WITH FOOD. 60 tablet 3   metoprolol succinate (TOPROL XL) 50 MG 24 hr tablet Take 1 tablet (50 mg total) by mouth daily. Take with a snack at bedtime. 30 tablet 2   Multiple Vitamins-Minerals (MULTIVITAMIN) LIQD Take 1 tablet by mouth daily. Unknown strength     nystatin-triamcinolone (MYCOLOG II) cream  Apply 1 application. topically 2 (two) times daily. 30 g 0   pantoprazole (PROTONIX) 40 MG tablet Take 1 tablet (40 mg total) by mouth daily. 90 tablet 3   pravastatin (PRAVACHOL) 10 MG tablet Take 1 tablet (10 mg total) by mouth at bedtime. 90 tablet 1   vitamin B-12 (CYANOCOBALAMIN) 1000 MCG tablet Take 1,000 mcg by mouth daily.     zolpidem (AMBIEN) 10 MG tablet Take 1 tablet (10 mg total) by mouth at bedtime as needed for sleep. 30 tablet 5   No current facility-administered medications on file prior to visit.   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   Left lower quadrant pain 01/26/2020   Nodule of finger of left hand 01/14/2018   Palpitations 05/12/2017   Proud flesh 09/15/2019   Retinal edema 02/09/2019   S/P cataract extraction and insertion of intraocular lens, left 02/09/2019   Thrombosed external hemorrhoid 12/18/2017   TIA (transient ischemic  attack)    Vitreous debris 02/09/2019   Past Surgical History:  Procedure Laterality Date   APPENDECTOMY     CARDIAC CATHETERIZATION     CATARACT EXTRACTION     EYE SURGERY Right    Laser   PACEMAKER IMPLANT N/A 12/11/2021   Procedure: PACEMAKER IMPLANT;  Surgeon: Constance Haw, MD;  Location: [facility];  Service: Cardiovascular;  Laterality: N/A;    Family History  Problem Relation Age of Onset   Thyroid cancer Mother    CAD Father    Breast cancer Sister    Renal cancer Sister    CAD Paternal Uncle    Social History   Socioeconomic History   Marital status: Married     Spouse name: Not on file   Number of children: 2   Years of education: Not on file   Highest education level: Not on file  Occupational History   Not on file  Tobacco Use   Smoking status: Former    Packs/day: 1.00    Types: Cigarettes    Quit date: 1976    Years since quitting: 48.2   Smokeless tobacco: Never  Vaping Use   Vaping Use: Never used  Substance and Sexual Activity   Alcohol use: Yes    Alcohol/week: 5.0 standard drinks of alcohol    Types: 5 Glasses of wine per week   Drug use: Never   Sexual activity: Yes    Partners: Female  Other Topics Concern   Not on file  Social History Narrative   Lives with wife   Social Determinants of Health   Financial Resource Strain: Not on file  Food Insecurity: Not on file  Transportation Needs: Not on file  Physical Activity: Not on file  Stress: Not on file  Social Connections: Not on file    Review of Systems  Constitutional:  Negative for chills, diaphoresis, fatigue and fever.  HENT:  Negative for congestion, ear pain and sore throat.   Respiratory:  Negative for cough and shortness of breath.   Cardiovascular:  Negative for chest pain and palpitations.  Gastrointestinal:  Negative for abdominal pain, constipation, diarrhea, nausea and vomiting.  Endocrine: Negative for polydipsia, polyphagia and polyuria.  Genitourinary:  Negative for dysuria and frequency.  Musculoskeletal:  Positive for back pain (radiating pain both legs). Negative for arthralgias and myalgias.  Neurological:  Negative for dizziness and headaches.  Psychiatric/Behavioral:  Negative for dysphoric mood. The patient is not nervous/anxious.      Objective:  BP 120/70   Pulse 72   Temp (!) 97.2 F (36.2 C)   Resp 18   Ht 6' (1.829 m)   Wt 235 lb (106.6 kg)   BMI 31.87 kg/m      04/01/2022    1:35 PM 03/18/2022   10:50 AM 02/11/2022    3:46 PM  BP/Weight  Systolic BP 123456 123456 123456  Diastolic BP 70 84 70  Wt. (Lbs) 235 237 233  BMI 31.87  kg/m2 32.14 kg/m2 31.6 kg/m2    Physical Exam Vitals reviewed.  Constitutional:      Appearance: Normal appearance.  HENT:     Right Ear: Tympanic membrane normal.     Left Ear: Tympanic membrane normal.     Nose: Nose normal.     Mouth/Throat:     Pharynx: No oropharyngeal exudate or posterior oropharyngeal erythema.  Eyes:     Conjunctiva/sclera: Conjunctivae normal.  Neck:     Vascular: No carotid bruit.  Cardiovascular:     Rate and Rhythm: Normal rate and regular rhythm.     Pulses: Normal pulses.     Heart sounds: Normal heart sounds.  Pulmonary:     Effort: Pulmonary effort is normal.     Breath sounds: Normal breath sounds.  Abdominal:     General: Bowel sounds are normal.     Palpations: There is no mass.     Tenderness: There is no abdominal tenderness.  Musculoskeletal:        General: Tenderness (lower back) present. No swelling, deformity or signs of injury.     Cervical back: Normal range of motion.     Right lower leg: No edema.     Left lower leg: No edema.  Skin:    Findings: No lesion.  Neurological:     Mental Status: He is alert and oriented to person, place, and time.  Psychiatric:        Mood and Affect: Mood normal.        Behavior: Behavior normal.         02/11/2022    3:51 PM 09/14/2020   10:02 AM  Depression screen PHQ 2/9  Decreased Interest 0 0  Down, Depressed, Hopeless 0 0  PHQ - 2 Score 0 0       08/28/2018    1:06 PM 12/23/2018    9:21 AM 09/14/2020   10:02 AM 10/22/2021    2:32 PM  Enlow in the past year?  0 0 0  Number of falls in past year - Comments Emmi Telephone Survey Actual Response =      Was there an injury with Fall?   0 0  Fall Risk Category Calculator   0 0  Fall Risk Category (Retired)   Low Low  (RETIRED) Patient Fall Risk Level   Low fall risk Low fall risk  Patient at Risk for Falls Due to   No Fall Risks No Fall Risks  Fall risk Follow up   Falls evaluation completed Falls evaluation completed     Lab Results  Component Value Date   WBC 4.2 12/11/2021   HGB 14.5 12/11/2021   HCT 43.8 12/11/2021   PLT 244 12/11/2021   GLUCOSE 101 (H) 02/11/2022   CHOL 124 07/16/2021   TRIG 131 07/16/2021   HDL 46 07/16/2021   LDLCALC 55 07/16/2021   ALT 22 02/11/2022   AST 18 02/11/2022   NA 135 02/11/2022   K 4.5 02/11/2022   CL 109 (H) 02/11/2022   CREATININE 1.27 02/11/2022   BUN 19 02/11/2022   CO2 22 02/11/2022   TSH 2.360 02/11/2022   HGBA1C 5.3 10/31/2021      Assessment & Plan:   Spinal stenosis of lumbar region without neurogenic claudication Assessment & Plan: Start tizanidine 2 mg one at night as needed.  Keep taking Meloxicam as needed Follow up with Neurologist after MRI 04/24/2022 Continue Physical therapy and back exercises  Follow up as needed   Orders: -     tiZANidine HCl; Take 1 tablet (2 mg total) by mouth every 6 (six) hours as needed for muscle spasms.  Dispense: 30 tablet; Refill: 0     Follow-up: Return if symptoms worsen or fail to improve.  An After Visit Summary was printed and given to the patient.  I, Neil Crouch have reviewed all documentation for this visit. The documentation on 04/01/22   for the exam, diagnosis, procedures, and orders are all accurate and  complete.    Neil Crouch, DNP, [facility] [phone]

## 2022-04-01 NOTE — Assessment & Plan Note (Signed)
Start tizanidine 2 mg one at night as needed.  Keep taking Meloxicam as needed Follow up with Neurologist after MRI 04/24/2022 Continue Physical therapy and back exercises  Follow up as needed

## 2022-04-02 ENCOUNTER — Ambulatory Visit
Admission: RE | Admit: 2022-04-02 | Discharge: 2022-04-02 | Disposition: A | Discharge status: Home / Self Care | Payer: Medicare Other | Source: Ambulatory Visit | Attending: Cardiology | Admitting: Cardiology

## 2022-04-02 DIAGNOSIS — Z950 Presence of cardiac pacemaker: Secondary | ICD-10-CM | POA: Diagnosis not present

## 2022-04-02 DIAGNOSIS — T82110A Breakdown (mechanical) of cardiac electrode, initial encounter: Secondary | ICD-10-CM

## 2022-04-04 DIAGNOSIS — M6281 Muscle weakness (generalized): Secondary | ICD-10-CM | POA: Diagnosis not present

## 2022-04-04 DIAGNOSIS — M545 Low back pain, unspecified: Secondary | ICD-10-CM | POA: Diagnosis not present

## 2022-04-08 ENCOUNTER — Encounter: Payer: Self-pay | Admitting: Cardiology

## 2022-04-08 ENCOUNTER — Ambulatory Visit: Payer: Medicare Other | Attending: Cardiology | Admitting: Cardiology

## 2022-04-08 VITALS — BP 140/90 | HR 58 | Ht 72.0 in | Wt 238.0 lb

## 2022-04-08 DIAGNOSIS — I739 Peripheral vascular disease, unspecified: Secondary | ICD-10-CM | POA: Diagnosis not present

## 2022-04-08 DIAGNOSIS — I10 Essential (primary) hypertension: Secondary | ICD-10-CM

## 2022-04-08 DIAGNOSIS — Z950 Presence of cardiac pacemaker: Secondary | ICD-10-CM

## 2022-04-08 DIAGNOSIS — I442 Atrioventricular block, complete: Secondary | ICD-10-CM

## 2022-04-08 DIAGNOSIS — R0789 Other chest pain: Secondary | ICD-10-CM

## 2022-04-08 DIAGNOSIS — R002 Palpitations: Secondary | ICD-10-CM | POA: Diagnosis not present

## 2022-04-08 HISTORY — DX: Atrioventricular block, complete: I44.2

## 2022-04-08 HISTORY — DX: Presence of cardiac pacemaker: Z95.0

## 2022-04-08 NOTE — Progress Notes (Unsigned)
Cardiology Office Note:    Date:  04/08/2022   ID:  Clarence Hill, DOB 1945/03/09, MRN [id number]  PCP:  [name], MD  Cardiologist:  Jenne Campus, MD    Referring MD: [name], MD   Chief Complaint  Patient presents with   Follow-up  Cardiac wise doing well  History of Present Illness:    Clarence Hill is a 77 y.o. male with past medical history significant for palpitations, PVCs, essential hypertension, dizziness, atypical chest pain some suspicion for enlargement of the aorta however CT of the chest did not confirm that.  At the end of last year he ended being in the hospital with what appears to be TIA like episode however eventually was find to have transient AV block end up having pacemaker implanted which is a [employer] device.  After that we noticed some elevation of the threshold ventricular lead however overall he uses ventricle channel very rarely less than 0.1% therefore we can keep just simply watching. He comes today to my office for follow-up.  Overall he is doing very well.  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   Left lower quadrant pain 01/26/2020   Nodule of finger of left hand 01/14/2018   Palpitations 05/12/2017   Proud flesh 09/15/2019   Retinal edema 02/09/2019   S/P cataract extraction and insertion of intraocular lens, left 02/09/2019   Thrombosed external hemorrhoid 12/18/2017    TIA (transient ischemic attack)    Vitreous debris 02/09/2019    Past Surgical History:  Procedure Laterality Date   APPENDECTOMY     CARDIAC CATHETERIZATION     CATARACT EXTRACTION     EYE SURGERY Right    Laser   PACEMAKER IMPLANT N/A 12/11/2021   Procedure: PACEMAKER IMPLANT;  Surgeon: Constance Haw, MD;  Location: [facility];  Service: Cardiovascular;  Laterality: N/A;    Current Medications: Current Meds  Medication Sig   allopurinol (ZYLOPRIM) 100 MG tablet TAKE 1 TABLET BY MOUTH EVERY DAY   amLODipine (NORVASC) 10 MG tablet Take 1 tablet (10 mg total) by mouth daily.   aspirin EC 81 MG tablet Take 81 mg by mouth daily.   colchicine 0.6 MG tablet TAKE ONE TABLET BY MOUTH DAILY AS NEEDED DURING GOUT FLARE   diphenhydramine-acetaminophen (TYLENOL PM) 25-500 MG TABS tablet Take 2 tablets by mouth at bedtime as needed (sleep).   fexofenadine (ALLEGRA HIVES 24HR) 180 MG tablet Take 180 mg by mouth daily.   meloxicam (MOBIC) 7.5 MG tablet TAKE 1 TABLET (7.5 MG TOTAL) BY MOUTH 2 (TWO) TIMES DAILY. TAKE WITH FOOD.   metoprolol succinate (TOPROL XL) 50 MG 24 hr tablet Take 1 tablet (50 mg total) by mouth daily. Take with a snack at bedtime.   Multiple Vitamins-Minerals (MULTIVITAMIN) LIQD Take 1 tablet by mouth daily. Unknown strength   pantoprazole (PROTONIX) 40 MG tablet Take 1 tablet (40 mg total) by mouth daily.   pravastatin (  PRAVACHOL) 10 MG tablet Take 1 tablet (10 mg total) by mouth at bedtime.   tiZANidine (ZANAFLEX) 2 MG tablet Take 1 tablet (2 mg total) by mouth every 6 (six) hours as needed for muscle spasms.   vitamin B-12 (CYANOCOBALAMIN) 1000 MCG tablet Take 1,000 mcg by mouth daily.   zolpidem (AMBIEN) 10 MG tablet Take 1 tablet (10 mg total) by mouth at bedtime as needed for sleep.     Allergies:   Prednisone, Levofloxacin, Penicillins, Pepcid [famotidine], and Penicillin g benzathine & proc   Social History   Socioeconomic History   Marital status:  Married    Spouse name: Not on file   Number of children: 2   Years of education: Not on file   Highest education level: Not on file  Occupational History   Not on file  Tobacco Use   Smoking status: Former    Packs/day: 1.00    Types: Cigarettes    Quit date: 47    Years since quitting: 48.2   Smokeless tobacco: Never  Vaping Use   Vaping Use: Never used  Substance and Sexual Activity   Alcohol use: Yes    Alcohol/week: 5.0 standard drinks of alcohol    Types: 5 Glasses of wine per week   Drug use: Never   Sexual activity: Yes    Partners: Female  Other Topics Concern   Not on file  Social History Narrative   Lives with wife   Social Determinants of Health   Financial Resource Strain: Not on file  Food Insecurity: Not on file  Transportation Needs: Not on file  Physical Activity: Not on file  Stress: Not on file  Social Connections: Not on file     Family History: The patient's family history includes Breast cancer in his sister; CAD in his father and paternal uncle; Renal cancer in his sister; Thyroid cancer in his mother. ROS:   Please see the history of present illness.    All 14 point review of systems negative except as described per history of present illness  EKGs/Labs/Other Studies Reviewed:      Recent Labs: 12/11/2021: Hemoglobin 14.5; Platelets 244 02/11/2022: ALT 22; BUN 19; Creatinine, Ser 1.27; Magnesium 2.2; Potassium 4.5; Sodium 135; TSH 2.360  Recent Lipid Panel    Component Value Date/Time   CHOL 124 07/16/2021 1431   TRIG 131 07/16/2021 1431   HDL 46 07/16/2021 1431   CHOLHDL 2.7 07/16/2021 1431   LDLCALC 55 07/16/2021 1431    Physical Exam:    VS:  BP (!) 140/90 (BP Location: Left Arm, Patient Position: Sitting, Cuff Size: Normal)   Pulse (!) 58   Ht 6' (1.829 m)   Wt 238 lb (108 kg)   SpO2 97%   BMI 32.28 kg/m     Wt Readings from Last 3 Encounters:  04/08/22 238 lb (108 kg)  04/01/22 235 lb (106.6 kg)  03/18/22 237 lb  (107.5 kg)     GEN:  Well nourished, well developed in no acute distress HEENT: Normal NECK: No JVD; No carotid bruits LYMPHATICS: No lymphadenopathy CARDIAC: RRR, no murmurs, no rubs, no gallops RESPIRATORY:  Clear to auscultation without rales, wheezing or rhonchi  ABDOMEN: Soft, non-tender, non-distended MUSCULOSKELETAL:  No edema; No deformity  SKIN: Warm and dry LOWER EXTREMITIES: no swelling NEUROLOGIC:  Alert and oriented x 3 PSYCHIATRIC:  Normal affect   ASSESSMENT:    1. Transient complete heart block (HCC)   2. Normally functioning cardiac pacemaker present  3. Atypical chest pain   4. Palpitations   5. Primary hypertension    PLAN:    In order of problems listed above:  Transient complete heart block that being addressed with pacemaker continue present management. Pacemaker present with some elevation of the chest when the ventricle chamber still acceptable continue monitoring I did review interrogation of the device. Atypical chest pain denies having any. Palpitations denies having any. Essential hypertension blood pressure still not well-controlled I get Chem-7 based on that decide about additional medications. Dyslipidemia I did review his K PN which show me LDL 55 HDL 46 good cholesterol profile continue present management. He described to have some pain in his leg at home that he walks there is suspicion for either some spine/back problem or claudication I will do segmental pressures   Medication Adjustments/Labs and Tests Ordered: Current medicines are reviewed at length with the patient today.  Concerns regarding medicines are outlined above.  No orders of the defined types were placed in this encounter.  Medication changes: No orders of the defined types were placed in this encounter.   Signed, Park Liter, MD, [facility] 04/08/2022 9:48 AM    [place]

## 2022-04-08 NOTE — Patient Instructions (Addendum)
Medication Instructions:  Your physician recommends that you continue on your current medications as directed. Please refer to the Current Medication list given to you today.  *If you need a refill on your cardiac medications before your next appointment, please call your pharmacy*   Lab Work: BMP- Today If you have labs (blood work) drawn today and your tests are completely normal, you will receive your results only by: [place] (if you have MyChart) OR A paper copy in the mail If you have any lab test that is abnormal or we need to change your treatment, we will call you to review the results.   Testing/Procedures: Your physician has requested that you have a lower duplex. This test is an ultrasound of the arteries in the legs. It looks at arterial blood flow in the legs. Allow one hour for Lower. There are no restrictions or special instructions    Follow-Up: At [facility], you and your health needs are our priority.  As part of our continuing mission to provide you with exceptional heart care, we have created designated Provider Care Teams.  These Care Teams include your primary Cardiologist (physician) and Advanced Practice Providers (APPs -  Physician Assistants and Nurse Practitioners) who all work together to provide you with the care you need, when you need it.  We recommend signing up for the patient portal called "MyChart".  Sign up information is provided on this After Visit Summary.  MyChart is used to connect with patients for Virtual Visits (Telemedicine).  Patients are able to view lab/test results, encounter notes, upcoming appointments, etc.  Non-urgent messages can be sent to your provider as well.   To learn more about what you can do with MyChart, go to [email or web address].    Your next appointment:   6 month(s)  The format for your next appointment:   In Person  Provider:   Jenne Campus, MD    Other Instructions NA

## 2022-04-09 LAB — BASIC METABOLIC PANEL
BUN/Creatinine Ratio: 15 (ref 10–24)
BUN: 20 mg/dL (ref 8–27)
CO2: 25 mmol/L (ref 20–29)
Calcium: 9.4 mg/dL (ref 8.6–10.2)
Chloride: 105 mmol/L (ref 96–106)
Creatinine, Ser: 1.32 mg/dL — ABNORMAL HIGH (ref 0.76–1.27)
Glucose: 96 mg/dL (ref 70–99)
Potassium: 5.2 mmol/L (ref 3.5–5.2)
Sodium: 143 mmol/L (ref 134–144)
eGFR: 56 mL/min/{1.73_m2} — ABNORMAL LOW (ref >59)

## 2022-04-12 NOTE — Progress Notes (Signed)
Remote pacemaker transmission.   

## 2022-04-17 ENCOUNTER — Telehealth: Payer: Self-pay | Admitting: Cardiology

## 2022-04-17 DIAGNOSIS — Z79899 Other long term (current) drug therapy: Secondary | ICD-10-CM

## 2022-04-17 DIAGNOSIS — I10 Essential (primary) hypertension: Secondary | ICD-10-CM

## 2022-04-17 MED ORDER — HYDROCHLOROTHIAZIDE 12.5 MG PO CAPS: 12.5000 mg | ORAL_CAPSULE | Freq: Every day | ORAL | 1 refills | Status: DC

## 2022-04-17 NOTE — Telephone Encounter (Signed)
Pt is returning Javanna's call regarding lab results. Please call back.

## 2022-04-17 NOTE — Telephone Encounter (Signed)
Return patient call, advised of the following per Dr. Raliegh Ip and agreed with plan. Medication sent, lab order on file.

## 2022-04-18 ENCOUNTER — Other Ambulatory Visit: Payer: Self-pay | Admitting: Cardiology

## 2022-04-18 ENCOUNTER — Ambulatory Visit: Payer: Medicare Other | Attending: Cardiology

## 2022-04-18 DIAGNOSIS — I739 Peripheral vascular disease, unspecified: Secondary | ICD-10-CM

## 2022-04-18 LAB — VAS US LOWER EXT ART SEG MULTI (SEGMENTALS & LE RAYNAUDS)
Left ABI: 1.29
Right ABI: 1.22

## 2022-04-19 ENCOUNTER — Telehealth: Payer: Self-pay

## 2022-04-19 NOTE — Telephone Encounter (Signed)
Pt viewed results in My Chart. Routed to PCP.  

## 2022-04-22 DIAGNOSIS — H26492 Other secondary cataract, left eye: Secondary | ICD-10-CM | POA: Diagnosis not present

## 2022-04-22 DIAGNOSIS — H209 Unspecified iridocyclitis: Secondary | ICD-10-CM | POA: Diagnosis not present

## 2022-04-22 DIAGNOSIS — H348312 Tributary (branch) retinal vein occlusion, right eye, stable: Secondary | ICD-10-CM | POA: Diagnosis not present

## 2022-04-22 DIAGNOSIS — Z961 Presence of intraocular lens: Secondary | ICD-10-CM | POA: Diagnosis not present

## 2022-04-22 DIAGNOSIS — Z9842 Cataract extraction status, left eye: Secondary | ICD-10-CM | POA: Diagnosis not present

## 2022-04-22 DIAGNOSIS — H439 Unspecified disorder of vitreous body: Secondary | ICD-10-CM | POA: Diagnosis not present

## 2022-04-24 ENCOUNTER — Ambulatory Visit (HOSPITAL_COMMUNITY)
Admission: RE | Admit: 2022-04-24 | Discharge: 2022-04-24 | Disposition: A | Discharge status: Home / Self Care | Payer: Medicare Other | Source: Ambulatory Visit | Attending: Neurosurgery | Admitting: Neurosurgery

## 2022-04-24 DIAGNOSIS — M48062 Spinal stenosis, lumbar region with neurogenic claudication: Secondary | ICD-10-CM

## 2022-04-24 DIAGNOSIS — M4316 Spondylolisthesis, lumbar region: Secondary | ICD-10-CM | POA: Diagnosis not present

## 2022-04-24 DIAGNOSIS — M47816 Spondylosis without myelopathy or radiculopathy, lumbar region: Secondary | ICD-10-CM | POA: Diagnosis not present

## 2022-04-24 NOTE — Progress Notes (Signed)
Per order changed device setting to MRI sure scan mode DOO 85 bpm for scan.  Tachy therapies off if applicable.  Will program device back to regular settings after scan and send transmission.

## 2022-04-29 ENCOUNTER — Telehealth: Payer: Self-pay | Admitting: *Deleted

## 2022-04-29 DIAGNOSIS — M48062 Spinal stenosis, lumbar region with neurogenic claudication: Secondary | ICD-10-CM | POA: Diagnosis not present

## 2022-04-29 NOTE — Telephone Encounter (Signed)
   Pre-operative Risk Assessment    Patient Name: Clarence Hill  DOB: 08/10/1945 MRN: [id number]      Request for Surgical Clearance    Procedure:   L2-3,L3-4,L4-5 Lamincetomy/facetectomies  Date of Surgery:  Clearance TBD                                 Surgeon:  Dr. Charlett Blake Surgeon's Group or Practice Name:  [facility] Phone number:  [phone] Fax number:  [phone]   Type of Clearance Requested:   - Medical  - Pharmacy:  Hold Aspirin Not Indicated.   Type of Anesthesia:  General    Additional requests/questions:    Signed, Greer Ee   04/29/2022, 3:37 PM

## 2022-04-29 NOTE — Telephone Encounter (Signed)
Dr. Agustin Cree,   Mr. Sanseverino was seen by you recently on 04/08/2022 and is scheduled to undergo laminectomy procedure.  Would he be at acceptable cardiac risk to continue with his upcoming spinal surgery procedure?  Can you also please offer guidance on holding ASA 81 mg for his scheduled procedure. Please route your response to p cv div preop.   Thank you, Ambrose Pancoast, NP

## 2022-04-30 NOTE — Telephone Encounter (Signed)
   Patient Name: Clarence Hill  DOB: 02-23-1945 MRN: [id number]  Primary Cardiologist: Jenne Campus, MD  Chart reviewed as part of pre-operative protocol coverage. Given past medical history and time since last visit, based on ACC/AHA guidelines, Clarence Hill is at acceptable risk for the planned procedure without further cardiovascular testing.  Per Dr. Agustin Cree patient is fine to proceed with scheduled procedure.    Regarding ASA therapy, we recommend continuation of ASA throughout the perioperative period.  However, if the surgeon feels that cessation of ASA is required in the perioperative period, it may be stopped 5-7 days prior to surgery with a plan to resume it as soon as felt to be feasible from a surgical standpoint in the post-operative period.   The patient was advised that if he develops new symptoms prior to surgery to contact our office to arrange for a follow-up visit, and he verbalized understanding.  I will route this recommendation to the requesting party via Epic fax function and remove from pre-op pool.  Please call with questions.  Mable Fill, Marissa Nestle, NP 04/30/2022, 8:52 AM

## 2022-05-01 ENCOUNTER — Other Ambulatory Visit: Payer: Self-pay

## 2022-05-01 DIAGNOSIS — M48061 Spinal stenosis, lumbar region without neurogenic claudication: Secondary | ICD-10-CM

## 2022-05-01 MED ORDER — TIZANIDINE HCL 2 MG PO TABS: 2.0000 mg | ORAL_TABLET | Freq: Four times a day (QID) | ORAL | 0 refills | Status: DC | PRN

## 2022-05-09 ENCOUNTER — Other Ambulatory Visit: Payer: Self-pay | Admitting: Cardiology

## 2022-05-09 NOTE — Telephone Encounter (Signed)
Rx to pharmacy

## 2022-05-16 ENCOUNTER — Other Ambulatory Visit: Payer: Self-pay | Admitting: Family Medicine

## 2022-05-16 DIAGNOSIS — M48061 Spinal stenosis, lumbar region without neurogenic claudication: Secondary | ICD-10-CM

## 2022-05-27 DIAGNOSIS — Z9842 Cataract extraction status, left eye: Secondary | ICD-10-CM | POA: Diagnosis not present

## 2022-05-27 DIAGNOSIS — H348312 Tributary (branch) retinal vein occlusion, right eye, stable: Secondary | ICD-10-CM | POA: Diagnosis not present

## 2022-05-27 DIAGNOSIS — Z961 Presence of intraocular lens: Secondary | ICD-10-CM | POA: Diagnosis not present

## 2022-05-27 DIAGNOSIS — H439 Unspecified disorder of vitreous body: Secondary | ICD-10-CM | POA: Diagnosis not present

## 2022-05-27 DIAGNOSIS — H209 Unspecified iridocyclitis: Secondary | ICD-10-CM | POA: Diagnosis not present

## 2022-06-02 ENCOUNTER — Encounter: Payer: Self-pay | Admitting: Family Medicine

## 2022-06-03 ENCOUNTER — Encounter: Payer: Self-pay | Admitting: Family Medicine

## 2022-06-03 ENCOUNTER — Ambulatory Visit (INDEPENDENT_AMBULATORY_CARE_PROVIDER_SITE_OTHER): Payer: Medicare Other | Admitting: Family Medicine

## 2022-06-03 VITALS — BP 124/70 | HR 86 | Temp 97.1°F | Ht 72.0 in | Wt 238.0 lb

## 2022-06-03 DIAGNOSIS — K148 Other diseases of tongue: Secondary | ICD-10-CM | POA: Diagnosis not present

## 2022-06-03 MED ORDER — TRIAMCINOLONE ACETONIDE 0.1 % MT PSTE
1.0000 | PASTE | Freq: Two times a day (BID) | OROMUCOSAL | 0 refills | Status: DC
Start: 2022-06-03 — End: 2022-09-10

## 2022-06-03 NOTE — Progress Notes (Signed)
Acute Office Visit  Subjective:    Patient ID: Clarence Hill, male    DOB: [birth year], 77 y.o.   MRN: [id number]  Chief Complaint  Patient presents with   Oral Swelling    Spot on tongue    HPI: Patient is in today for bump on tongue. Discomfort. No history of smoking, dipping, chewing tobacco. Has been there for 6 days.   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   Left lower quadrant pain 01/26/2020   Nodule of finger of left hand 01/14/2018   Palpitations 05/12/2017   Proud flesh 09/15/2019   Retinal edema 02/09/2019   S/P cataract extraction and insertion of intraocular lens, left 02/09/2019   Thrombosed external hemorrhoid 12/18/2017   TIA (transient ischemic attack)    Vitreous debris 02/09/2019    Past Surgical History:  Procedure Laterality Date   APPENDECTOMY     CARDIAC CATHETERIZATION     CATARACT EXTRACTION     EYE SURGERY Right    Laser   PACEMAKER IMPLANT N/A 12/11/2021   Procedure: PACEMAKER IMPLANT;  Surgeon: Regan Lemming, MD;  Location: [facility];  Service: Cardiovascular;  Laterality: N/A;    Family History  Problem Relation Age of Onset   Thyroid cancer Mother    CAD Father    Breast cancer Sister    Renal cancer Sister    CAD Paternal Uncle     Social History   Socioeconomic History   Marital status: Married    Spouse name: Not on file   Number of children: 2   Years of education: Not on file   Highest education level: Not on file  Occupational History   Not on file  Tobacco Use    Smoking status: Former    Packs/day: 1    Types: Cigarettes    Quit date: 1976    Years since quitting: 48.3   Smokeless tobacco: Never  Vaping Use   Vaping Use: Never used  Substance and Sexual Activity   Alcohol use: Yes    Alcohol/week: 5.0 standard drinks of alcohol    Types: 5 Glasses of wine per week   Drug use: Never   Sexual activity: Yes    Partners: Female  Other Topics Concern   Not on file  Social History Narrative   Lives with wife   Social Determinants of Health   Financial Resource Strain: Not on file  Food Insecurity: Not on file  Transportation Needs: Not on file  Physical Activity: Not on file  Stress: Not on file  Social Connections: Not on file  Intimate Partner Violence: Not on file    Outpatient Medications Prior to Visit  Medication Sig Dispense Refill   allopurinol (ZYLOPRIM) 100 MG tablet TAKE 1 TABLET BY MOUTH EVERY DAY 90 tablet 1   amLODipine (NORVASC) 10 MG tablet Take 1 tablet (10 mg total) by mouth daily. 90 tablet 3   aspirin EC 81 MG tablet Take 81 mg by mouth daily.     colchicine 0.6 MG tablet TAKE  ONE TABLET BY MOUTH DAILY AS NEEDED DURING GOUT FLARE 30 tablet 0   diphenhydramine-acetaminophen (TYLENOL PM) 25-500 MG TABS tablet Take 2 tablets by mouth at bedtime as needed (sleep).     fexofenadine (ALLEGRA HIVES 24HR) 180 MG tablet Take 180 mg by mouth daily.     hydrochlorothiazide (MICROZIDE) 12.5 MG capsule TAKE 1 CAPSULE BY MOUTH EVERY DAY 90 capsule 3   meloxicam (MOBIC) 7.5 MG tablet TAKE 1 TABLET (7.5 MG TOTAL) BY MOUTH 2 (TWO) TIMES DAILY. TAKE WITH FOOD. 60 tablet 3   metoprolol succinate (TOPROL XL) 50 MG 24 hr tablet Take 1 tablet (50 mg total) by mouth daily. Take with a snack at bedtime. 30 tablet 2   Multiple Vitamins-Minerals (MULTIVITAMIN) LIQD Take 1 tablet by mouth daily. Unknown strength     pantoprazole (PROTONIX) 40 MG tablet Take 1 tablet (40 mg total) by mouth daily. 90 tablet 3   pravastatin (PRAVACHOL) 10 MG  tablet Take 1 tablet (10 mg total) by mouth at bedtime. 90 tablet 1   tiZANidine (ZANAFLEX) 2 MG tablet TAKE 1 TABLET BY MOUTH EVERY 6 HOURS AS NEEDED FOR MUSCLE SPASMS. 30 tablet 0   vitamin B-12 (CYANOCOBALAMIN) 1000 MCG tablet Take 1,000 mcg by mouth daily.     zolpidem (AMBIEN) 10 MG tablet Take 1 tablet (10 mg total) by mouth at bedtime as needed for sleep. 30 tablet 5   No facility-administered medications prior to visit.    Allergies  Allergen Reactions   Prednisone     Made him feel crazy   Levofloxacin Other (See Comments)    Unknown reaction   Penicillins     Unknown reaction   Pepcid [Famotidine] Cough   Penicillin G Benzathine & Proc Rash    Review of Systems  Constitutional:  Negative for chills and fatigue.  HENT:         Did not bite tongue.        Objective:        06/03/2022    4:13 PM 04/08/2022    9:17 AM 04/01/2022    1:35 PM  Vitals with BMI  Height 6\' 0"  6\' 0"  6\' 0"   Weight 238 lbs 238 lbs 235 lbs  BMI 32.27 32.27 31.86  Systolic 124 140 295  Diastolic 70 90 70  Pulse 86 58 72    Orthostatic VS for the past 72 hrs (Last 3 readings):  Patient Position BP Location Cuff Size  06/03/22 1613 Sitting Right Arm Large     Physical Exam Vitals reviewed.  Constitutional:      Appearance: Normal appearance.  HENT:     Mouth/Throat:     Comments: Right side of tongue distal half. Pedunculated lesion with white top. Soft. Tender.  Neurological:     Mental Status: He is alert.     Health Maintenance Due  Topic Date Due   Zoster Vaccines- Shingrix (1 of 2) Never done   Medicare Annual Wellness (AWV)  05/15/2021    There are no preventive care reminders to display for this patient.   Lab Results  Component Value Date   TSH 2.360 02/11/2022   Lab Results  Component Value Date   WBC 4.2 12/11/2021   HGB 14.5 12/11/2021   HCT 43.8 12/11/2021   MCV 91.3 12/11/2021   PLT 244 12/11/2021   Lab Results  Component Value Date   NA 143  04/08/2022   K 5.2 04/08/2022   CO2 25 04/08/2022   GLUCOSE 96 04/08/2022  BUN 20 04/08/2022   CREATININE 1.32 (H) 04/08/2022   BILITOT 0.4 02/11/2022   ALKPHOS 70 02/11/2022   AST 18 02/11/2022   ALT 22 02/11/2022   PROT 6.7 02/11/2022   ALBUMIN 4.3 02/11/2022   CALCIUM 9.4 04/08/2022   ANIONGAP 7 12/11/2021   EGFR 56 (L) 04/08/2022   Lab Results  Component Value Date   CHOL 124 07/16/2021   Lab Results  Component Value Date   HDL 46 07/16/2021   Lab Results  Component Value Date   LDLCALC 55 07/16/2021   Lab Results  Component Value Date   TRIG 131 07/16/2021   Lab Results  Component Value Date   CHOLHDL 2.7 07/16/2021   Lab Results  Component Value Date   HGBA1C 5.3 10/31/2021       Assessment & Plan:  Tongue mass -     Ambulatory referral to ENT -     Triamcinolone Acetonide; Use as directed 1 Application in the mouth or throat 2 (two) times daily.  Dispense: 5 g; Refill: 0     Meds ordered this encounter  Medications   triamcinolone (KENALOG) 0.1 % paste    Sig: Use as directed 1 Application in the mouth or throat 2 (two) times daily.    Dispense:  5 g    Refill:  0    Ok to take despite prednisone allergy    Orders Placed This Encounter  Procedures   Ambulatory referral to ENT     Follow-up: No follow-ups on file.  An After Visit Summary was printed and given to the patient.  Blane Ohara, MD [facility] [phone]

## 2022-06-03 NOTE — Telephone Encounter (Signed)
Appt needed. Dr. Sedalia Muta

## 2022-06-07 DIAGNOSIS — Z87891 Personal history of nicotine dependence: Secondary | ICD-10-CM | POA: Diagnosis not present

## 2022-06-07 DIAGNOSIS — D101 Benign neoplasm of tongue: Secondary | ICD-10-CM | POA: Diagnosis not present

## 2022-06-07 DIAGNOSIS — D490 Neoplasm of unspecified behavior of digestive system: Secondary | ICD-10-CM | POA: Diagnosis not present

## 2022-06-12 ENCOUNTER — Ambulatory Visit (INDEPENDENT_AMBULATORY_CARE_PROVIDER_SITE_OTHER): Payer: Medicare Other

## 2022-06-12 DIAGNOSIS — I442 Atrioventricular block, complete: Secondary | ICD-10-CM

## 2022-06-13 LAB — CUP PACEART REMOTE DEVICE CHECK
Battery Remaining Longevity: 178 mo
Battery Voltage: 3.18 V
Brady Statistic AP VP Percent: 0 %
Brady Statistic AP VS Percent: 2.28 %
Brady Statistic AS VP Percent: 0 %
Brady Statistic AS VS Percent: 97.72 %
Brady Statistic RA Percent Paced: 2.28 %
Brady Statistic RV Percent Paced: 0 %
Date Time Interrogation Session: 20240515082932
Implantable Lead Connection Status: 753985
Implantable Lead Connection Status: 753985
Implantable Lead Implant Date: 20231114
Implantable Lead Implant Date: 20231114
Implantable Lead Location: 753859
Implantable Lead Location: 753860
Implantable Lead Model: 3830
Implantable Lead Model: 5076
Implantable Pulse Generator Implant Date: 20231114
Lead Channel Impedance Value: 304 Ohm
Lead Channel Impedance Value: 342 Ohm
Lead Channel Impedance Value: 361 Ohm
Lead Channel Impedance Value: 646 Ohm
Lead Channel Pacing Threshold Amplitude: 0.5 V
Lead Channel Pacing Threshold Amplitude: 1.125 V
Lead Channel Pacing Threshold Pulse Width: 0.4 ms
Lead Channel Pacing Threshold Pulse Width: 0.4 ms
Lead Channel Sensing Intrinsic Amplitude: 3.5 mV
Lead Channel Sensing Intrinsic Amplitude: 3.5 mV
Lead Channel Sensing Intrinsic Amplitude: 4.75 mV
Lead Channel Sensing Intrinsic Amplitude: 4.75 mV
Lead Channel Setting Pacing Amplitude: 2 V
Lead Channel Setting Pacing Amplitude: 6 V
Lead Channel Setting Pacing Pulse Width: 1 ms
Lead Channel Setting Sensing Sensitivity: 1.2 mV
Zone Setting Status: 755011

## 2022-06-14 ENCOUNTER — Other Ambulatory Visit: Payer: Self-pay | Admitting: Neurosurgery

## 2022-06-25 ENCOUNTER — Encounter: Payer: Self-pay | Admitting: Cardiology

## 2022-06-26 ENCOUNTER — Encounter: Payer: Self-pay | Admitting: Cardiology

## 2022-06-27 NOTE — Telephone Encounter (Signed)
Outreach made to Pt.  Pt c/o hard heart beat.  Just one.  Felt a little dizzy.  He was concerned.  Pt has a history of PVC's.    We discussed how PVC's work.  Potential symptoms.  Treatment options (Pt on Toprol for PVC suppression).  Long discussion.  All questions answered.  Pt thanked nurse for call.

## 2022-06-27 NOTE — Progress Notes (Signed)
Remote pacemaker transmission.   

## 2022-07-02 ENCOUNTER — Other Ambulatory Visit: Payer: Self-pay | Admitting: Family Medicine

## 2022-07-02 DIAGNOSIS — M48061 Spinal stenosis, lumbar region without neurogenic claudication: Secondary | ICD-10-CM

## 2022-07-08 ENCOUNTER — Other Ambulatory Visit: Payer: Self-pay | Admitting: Family Medicine

## 2022-07-08 DIAGNOSIS — M48061 Spinal stenosis, lumbar region without neurogenic claudication: Secondary | ICD-10-CM

## 2022-07-09 ENCOUNTER — Other Ambulatory Visit: Payer: Self-pay | Admitting: Neurosurgery

## 2022-07-10 ENCOUNTER — Encounter: Payer: Self-pay | Admitting: Cardiology

## 2022-07-10 NOTE — Progress Notes (Signed)
Orders requested from [facility] MD 07/10/22 at 1055.  Device Rep emailed at same date and time.

## 2022-07-10 NOTE — Pre-Procedure Instructions (Signed)
Surgical Instructions    Your procedure is scheduled on July 16, 2022.  Report to [facility] Main Entrance "A" at 9:50 A.M., then check in with the Admitting office.  Call this number if you have problems the morning of surgery:  [phone]  If you have any questions prior to your surgery date call [phone]: Open Monday-Friday 8am-4pm If you experience any cold or flu symptoms such as cough, fever, chills, shortness of breath, etc. between now and your scheduled surgery, please notify us at the above number.     Remember:  Do not eat or drink after midnight the night before your surgery     Take these medicines the morning of surgery with A SIP OF WATER:  allopurinol (ZYLOPRIM)   amLODipine (NORVASC)   fexofenadine (ALLEGRA HIVES 24HR)   pantoprazole (PROTONIX)    May take these medicines IF NEEDED:  colchicine   tiZANidine (ZANAFLEX)    Follow your surgeon's instructions on when to stop Aspirin.  If no instructions were given by your surgeon then you will need to call the office to get those instructions.     As of today, STOP taking any Aleve, Naproxen, Ibuprofen, Motrin, Advil, Goody's, BC's, all herbal medications, fish oil, and all vitamins. This includes your medication: meloxicam (MOBIC)                      Do NOT Smoke (Tobacco/Vaping) for 24 hours prior to your procedure.  If you use a CPAP at night, you may bring your mask/headgear for your overnight stay.   Contacts, glasses, piercing's, hearing aid's, dentures or partials may not be worn into surgery, please bring cases for these belongings.    For patients admitted to the hospital, discharge time will be determined by your treatment team.   Patients discharged the day of surgery will not be allowed to drive home, and someone needs to stay with them for 24 hours.  SURGICAL WAITING ROOM VISITATION Patients having surgery or a procedure may have no more than 2 support people in the waiting area - these  visitors may rotate.   Children under the age of 56 must have an adult with them who is not the patient. If the patient needs to stay at the hospital during part of their recovery, the visitor guidelines for inpatient rooms apply. Pre-op nurse will coordinate an appropriate time for 1 support person to accompany patient in pre-op.  This support person may not rotate.   Please refer to the [facility] website for the visitor guidelines for Inpatients (after your surgery is over and you are in a regular room).   If you received a COVID test during your pre-op visit  it is requested that you wear a mask when out in public, stay away from anyone that may not be feeling well and notify your surgeon if you develop symptoms. If you have been in contact with anyone that has tested positive in the last 10 days please notify you surgeon.    Pre-operative 5 CHG Bath Instructions   You can play a key role in reducing the risk of infection after surgery. Your skin needs to be as free of germs as possible. You can reduce the number of germs on your skin by washing with CHG (chlorhexidine gluconate) soap before surgery. CHG is an antiseptic soap that kills germs and continues to kill germs even after washing.   DO NOT use if you have an allergy to chlorhexidine/CHG or  antibacterial soaps. If your skin becomes reddened or irritated, stop using the CHG and notify one of our RNs at [phone].   Please shower with the CHG soap starting 4 days before surgery using the following schedule:     Please keep in mind the following:  DO NOT shave, including legs and underarms, starting the day of your first shower.   You may shave your face at any point before/day of surgery.  Place clean sheets on your bed the day you start using CHG soap. Use a clean washcloth (not used since being washed) for each shower. DO NOT sleep with pets once you start using the CHG.   CHG Shower Instructions:  If you choose to wash  your hair and private area, wash first with your normal shampoo/soap.  After you use shampoo/soap, rinse your hair and body thoroughly to remove shampoo/soap residue.  Turn the water OFF and apply about 3 tablespoons (45 ml) of CHG soap to a CLEAN washcloth.  Apply CHG soap ONLY FROM YOUR NECK DOWN TO YOUR TOES (washing for 3-5 minutes)  DO NOT use CHG soap on face, private areas, open wounds, or sores.  Pay special attention to the area where your surgery is being performed.  If you are having back surgery, having someone wash your back for you may be helpful. Wait 2 minutes after CHG soap is applied, then you may rinse off the CHG soap.  Pat dry with a clean towel  Put on clean clothes/pajamas   If you choose to wear lotion, please use ONLY the CHG-compatible lotions on the back of this paper.     Additional instructions for the day of surgery: DO NOT APPLY any lotions, deodorants, cologne, or perfumes.   Do not wear jewelry or makeup Do not wear nail polish, gel polish, artificial nails, or any other type of covering on natural nails (fingers and toes) Do not bring valuables to the hospital. [facility] is not responsible for any belongings or valuables. Put on clean/comfortable clothes.  Brush your teeth.  Ask your nurse before applying any prescription medications to the skin.      CHG Compatible Lotions   Aveeno Moisturizing lotion  Cetaphil Moisturizing Cream  Cetaphil Moisturizing Lotion  Clairol Herbal Essence Moisturizing Lotion, Dry Skin  Clairol Herbal Essence Moisturizing Lotion, Extra Dry Skin  Clairol Herbal Essence Moisturizing Lotion, Normal Skin  Curel Age Defying Therapeutic Moisturizing Lotion with Alpha Hydroxy  Curel Extreme Care Body Lotion  Curel Soothing Hands Moisturizing Hand Lotion  Curel Therapeutic Moisturizing Cream, Fragrance-Free  Curel Therapeutic Moisturizing Lotion, Fragrance-Free  Curel Therapeutic Moisturizing Lotion, Original Formula   Eucerin Daily Replenishing Lotion  Eucerin Dry Skin Therapy Plus Alpha Hydroxy Crme  Eucerin Dry Skin Therapy Plus Alpha Hydroxy Lotion  Eucerin Original Crme  Eucerin Original Lotion  Eucerin Plus Crme Eucerin Plus Lotion  Eucerin TriLipid Replenishing Lotion  Keri Anti-Bacterial Hand Lotion  Keri Deep Conditioning Original Lotion Dry Skin Formula Softly Scented  Keri Deep Conditioning Original Lotion, Fragrance Free Sensitive Skin Formula  Keri Lotion Fast Absorbing Fragrance Free Sensitive Skin Formula  Keri Lotion Fast Absorbing Softly Scented Dry Skin Formula  Keri Original Lotion  Keri Skin Renewal Lotion Keri Silky Smooth Lotion  Keri Silky Smooth Sensitive Skin Lotion  Nivea Body Creamy Conditioning Oil  Nivea Body Extra Enriched Teacher, adult education Moisturizing Lotion Nivea Crme  Nivea Skin Firming Lotion  NutraDerm 30 Skin Lotion  NutraDerm  Skin Lotion  NutraDerm Therapeutic Skin Cream  NutraDerm Therapeutic Skin Lotion  ProShield Protective Hand Cream  Provon moisturizing lotion   Please read over the following fact sheets that you were given.

## 2022-07-10 NOTE — Progress Notes (Signed)
PERIOPERATIVE PRESCRIPTION FOR IMPLANTED CARDIAC DEVICE PROGRAMMING  Patient Information: Name:  Cambridge Deleo  DOB:  10/10/1945  MRN:  [id number]    Planned Procedure:  L2-L3, L3-L4, L4-L5 Laminectomy, Bilateral medial Facetectomies  Surgeon:  Dr. Hoyt Koch  Date of Procedure:  07/16/2022  Cautery will be used.  Position during surgery:  Prone   Please send documentation back to:  [facility] (Fax # [phone])  Device Information:  Clinic EP Physician:  Loman Brooklyn, MD   Device Type:  Pacemaker Manufacturer and Phone #:  Medtronic: [phone] Pacemaker Dependent?:  No. Date of Last Device Check:  06/26/22 Normal Device Function?:  Yes.    Electrophysiologist's Recommendations:  Have magnet available. Provide continuous ECG monitoring when magnet is used or reprogramming is to be performed.  Procedure may interfere with device function.  Magnet should be placed over device during procedure.  Per Device Clinic Standing Orders, Lenor Coffin, RN  11:01 AM 07/10/2022

## 2022-07-11 ENCOUNTER — Encounter (HOSPITAL_COMMUNITY): Payer: Self-pay

## 2022-07-11 ENCOUNTER — Encounter (HOSPITAL_COMMUNITY)
Admission: RE | Admit: 2022-07-11 | Discharge: 2022-07-11 | Disposition: A | Discharge status: Home / Self Care | Payer: Medicare Other | Source: Ambulatory Visit | Attending: Neurosurgery | Admitting: Neurosurgery

## 2022-07-11 ENCOUNTER — Other Ambulatory Visit: Payer: Self-pay

## 2022-07-11 VITALS — BP 135/88 | HR 67 | Temp 97.8°F | Resp 18 | Ht 72.0 in | Wt 236.5 lb

## 2022-07-11 DIAGNOSIS — Z950 Presence of cardiac pacemaker: Secondary | ICD-10-CM | POA: Diagnosis not present

## 2022-07-11 DIAGNOSIS — I444 Left anterior fascicular block: Secondary | ICD-10-CM | POA: Diagnosis not present

## 2022-07-11 DIAGNOSIS — I080 Rheumatic disorders of both mitral and aortic valves: Secondary | ICD-10-CM | POA: Diagnosis not present

## 2022-07-11 DIAGNOSIS — Z01812 Encounter for preprocedural laboratory examination: Secondary | ICD-10-CM | POA: Insufficient documentation

## 2022-07-11 DIAGNOSIS — I493 Ventricular premature depolarization: Secondary | ICD-10-CM | POA: Diagnosis not present

## 2022-07-11 DIAGNOSIS — I10 Essential (primary) hypertension: Secondary | ICD-10-CM | POA: Insufficient documentation

## 2022-07-11 DIAGNOSIS — Z789 Other specified health status: Secondary | ICD-10-CM | POA: Insufficient documentation

## 2022-07-11 DIAGNOSIS — Z01818 Encounter for other preprocedural examination: Secondary | ICD-10-CM

## 2022-07-11 HISTORY — DX: Unspecified osteoarthritis, unspecified site: M19.90

## 2022-07-11 HISTORY — DX: Pneumonia, unspecified organism: J18.9

## 2022-07-11 LAB — COMPREHENSIVE METABOLIC PANEL
ALT: 26 U/L (ref 0–44)
AST: 25 U/L (ref 15–41)
Albumin: 4.1 g/dL (ref 3.5–5.0)
Alkaline Phosphatase: 53 U/L (ref 38–126)
Anion gap: 13 (ref 5–15)
BUN: 24 mg/dL — ABNORMAL HIGH (ref 8–23)
CO2: 25 mmol/L (ref 22–32)
Calcium: 9.4 mg/dL (ref 8.9–10.3)
Chloride: 102 mmol/L (ref 98–111)
Creatinine, Ser: 1.5 mg/dL — ABNORMAL HIGH (ref 0.61–1.24)
GFR, Estimated: 48 mL/min — ABNORMAL LOW (ref >60)
Glucose, Bld: 102 mg/dL — ABNORMAL HIGH (ref 70–99)
Potassium: 4.5 mmol/L (ref 3.5–5.1)
Sodium: 140 mmol/L (ref 135–145)
Total Bilirubin: 0.8 mg/dL (ref 0.3–1.2)
Total Protein: 7.4 g/dL (ref 6.5–8.1)

## 2022-07-11 LAB — CBC
HCT: 46 % (ref 39.0–52.0)
Hemoglobin: 15.5 g/dL (ref 13.0–17.0)
MCH: 31.1 pg (ref 26.0–34.0)
MCHC: 33.7 g/dL (ref 30.0–36.0)
MCV: 92.2 fL (ref 80.0–100.0)
Platelets: 262 10*3/uL (ref 150–400)
RBC: 4.99 MIL/uL (ref 4.22–5.81)
RDW: 13.6 % (ref 11.5–15.5)
WBC: 4.8 10*3/uL (ref 4.0–10.5)
nRBC: 0 % (ref 0.0–0.2)

## 2022-07-11 LAB — SURGICAL PCR SCREEN
MRSA, PCR: NEGATIVE
Staphylococcus aureus: NEGATIVE

## 2022-07-11 NOTE — Progress Notes (Addendum)
PCP - Dr. Mickey Farber Cardiologist - Dr. Gypsy Balsam EP - Dr. Andree Coss [facility]  PPM/ICD - Medtronic PPM Device Orders - Orders received, printed and placed in chart Rep Notified - Medtronic rep notified and received confirmation email 07/10/2022  Chest x-ray - 04/02/2022 EKG - 12/11/2021 Stress Test - 10/04/2021 ECHO - 10/14/2021 Cardiac Cath - 12/07/2013  Sleep Study - Denies CPAP - n/a  No DM  Last dose of GLP1 agonist- n/a GLP1 instructions: n/a  Blood Thinner Instructions: n/a Aspirin Instructions: Pts last dose of ASA was morning dose 07/08/22  NPO after midnight  COVID TEST- n/a   Anesthesia review: Yes. PPM and hx of HTN. Pt recently reached out to EP doctor about "strong heartbeat" that felt similar to ones be experienced prior to pacemaker insertion. MD aware and no new orders/recommendations given. Pt states he has not experienced one of those episodes since he reached out to MD end of May 2024. Creatinine increased from previous result (1.32 to 1.5)  Patient denies shortness of breath, fever, cough and chest pain at PAT appointment. Pt denies any respiratory illness/infection in the last two months.   All instructions explained to the patient, with a verbal understanding of the material. Patient agrees to go over the instructions while at home for a better understanding. Patient also instructed to self quarantine after being tested for COVID-19. The opportunity to ask questions was provided.

## 2022-07-12 NOTE — Anesthesia Preprocedure Evaluation (Signed)
Anesthesia Evaluation  Patient identified by MRN, date of birth, ID band Patient awake    Reviewed: Allergy & Precautions, NPO status , Patient's Chart, lab work & pertinent test results  Airway Mallampati: II  TM Distance: >3 FB Neck ROM: Full    Dental no notable dental hx.    Pulmonary neg pulmonary ROS, former smoker   Pulmonary exam normal        Cardiovascular hypertension, Pt. on medications + dysrhythmias + pacemaker  Rhythm:Regular Rate:Normal     Neuro/Psych TIA negative psych ROS   GI/Hepatic Neg liver ROS,GERD  Medicated,,  Endo/Other  negative endocrine ROS    Renal/GU negative Renal ROS  negative genitourinary   Musculoskeletal  (+) Arthritis , Osteoarthritis,    Abdominal Normal abdominal exam  (+)   Peds  Hematology Lab Results      Component                Value               Date                      WBC                      4.8                 07/11/2022                HGB                      15.5                07/11/2022                HCT                      46.0                07/11/2022                MCV                      92.2                07/11/2022                PLT                      262                 07/11/2022             Lab Results      Component                Value               Date                      NA                       140                 07/11/2022                K  4.5                 07/11/2022                CO2                      25                  07/11/2022                GLUCOSE                  102 (H)             07/11/2022                BUN                      24 (H)              07/11/2022                CREATININE               1.50 (H)            07/11/2022                CALCIUM                  9.4                 07/11/2022                EGFR                     56 (L)              04/08/2022                 GFRNONAA                 48 (L)              07/11/2022              Anesthesia Other Findings   Reproductive/Obstetrics                             Anesthesia Physical Anesthesia Plan  ASA: 3  Anesthesia Plan: General   Post-op Pain Management: Celebrex PO (pre-op)* and Tylenol PO (pre-op)*   Induction: Intravenous  PONV Risk Score and Plan: 2 and Ondansetron, Dexamethasone and Treatment may vary due to age or medical condition  Airway Management Planned: Mask and Oral ETT  Additional Equipment: None  Intra-op Plan:   Post-operative Plan: Extubation in OR  Informed Consent: I have reviewed the patients History and Physical, chart, labs and discussed the procedure including the risks, benefits and alternatives for the proposed anesthesia with the patient or authorized representative who has indicated his/her understanding and acceptance.     Dental advisory given  Plan Discussed with: CRNA  Anesthesia Plan Comments: (PAT note by Antionette Poles, PA-C: Follows cardiology for history of palpitations, PVCs, hypertension, atypical chest pain, ?TIA, transient CHB s/p Medtronic PPM.  Clearance per telephone encounter 04/30/2022 by Robin Searing, NP, "Chart reviewed as part of pre-operative protocol coverage. Given past medical history and time since last visit, based on ACC/AHA guidelines, Clarence Hill is  at acceptable risk for the planned procedure without further cardiovascular testing.  Per Dr. Bing Matter patient is fine to proceed with scheduled procedure.  Regarding ASA therapy, we recommend continuation of ASA throughout the perioperative period.  However, if the surgeon feels that cessation of ASA is required in the perioperative period, it may be stopped 5-7 days prior to surgery with a plan to resume it as soon as felt to be feasible from a surgical standpoint in the post-operative period."  Preop labs reviewed, creatinine elevated 1.50, otherwise  unremarkable.  EKG 03/18/2022: NSR.  Rate 64.  Left anterior fascicular block.  Perioperative prescription for implanted cardiac device programming per progress note 07/10/2022: Device Information:  Clinic EP Physician:  Loman Brooklyn, MD   Device Type:  Pacemaker Manufacturer and Phone #:  Medtronic: [phone] Pacemaker Dependent?:  No. Date of Last Device Check:  5/29/24Normal Device Function?:  Yes.    Electrophysiologist's Recommendations:  ? Have magnet available. ? Provide continuous ECG monitoring when magnet is used or reprogramming is to be performed.  ? Procedure may interfere with device function.  Magnet should be placed over device during procedure.   TTE 10/14/2021: Left ventricular ejection fraction is 60 to 65%. Left ventricular diastolic function has not impaired relaxation pattern. There is mild mitral regurgitation. There is mild aortic regurgitation.  Nuclear stress 10/04/2021:   The study is normal. The study is low risk.   Left ventricular function is normal. Nuclear stress EF: 56 %. The left ventricular ejection fraction is normal (55-65%). End diastolic cavity size is normal.   Prior study not available for comparison.   )        Anesthesia Quick Evaluation

## 2022-07-12 NOTE — Progress Notes (Signed)
Anesthesia Chart Review:  Follows cardiology for history of palpitations, PVCs, hypertension, atypical chest pain, ?TIA, transient CHB s/p Medtronic PPM.  Clearance per telephone encounter 04/30/2022 by Robin Searing, NP, "Chart reviewed as part of pre-operative protocol coverage. Given past medical history and time since last visit, based on ACC/AHA guidelines, Clarence Hill is at acceptable risk for the planned procedure without further cardiovascular testing.  Per Dr. Bing Matter patient is fine to proceed with scheduled procedure.  Regarding ASA therapy, we recommend continuation of ASA throughout the perioperative period.  However, if the surgeon feels that cessation of ASA is required in the perioperative period, it may be stopped 5-7 days prior to surgery with a plan to resume it as soon as felt to be feasible from a surgical standpoint in the post-operative period."  Preop labs reviewed, creatinine elevated 1.50, otherwise unremarkable.  EKG 03/18/2022: NSR.  Rate 64.  Left anterior fascicular block.  Perioperative prescription for implanted cardiac device programming per progress note 07/10/2022: Device Information:   Clinic EP Physician:  Loman Brooklyn, MD    Device Type:  Pacemaker Manufacturer and Phone #:  Medtronic: [phone] Pacemaker Dependent?:  No. Date of Last Device Check:  06/26/22           Normal Device Function?:  Yes.     Electrophysiologist's Recommendations:   Have magnet available. Provide continuous ECG monitoring when magnet is used or reprogramming is to be performed.  Procedure may interfere with device function.  Magnet should be placed over device during procedure.   TTE 10/14/2021: Left ventricular ejection fraction is 60 to 65%. Left ventricular diastolic function has not impaired relaxation pattern. There is mild mitral regurgitation. There is mild aortic regurgitation.  Nuclear stress 10/04/2021:   The study is normal. The study is low risk.   Left  ventricular function is normal. Nuclear stress EF: 56 %. The left ventricular ejection fraction is normal (55-65%). End diastolic cavity size is normal.   Prior study not available for comparison.    Zannie Cove [facility] Short Stay Center/Anesthesiology Phone [phone] 07/12/2022 9:04 AM

## 2022-07-16 ENCOUNTER — Ambulatory Visit (HOSPITAL_COMMUNITY): Payer: Medicare Other

## 2022-07-16 ENCOUNTER — Other Ambulatory Visit: Payer: Self-pay

## 2022-07-16 ENCOUNTER — Encounter (HOSPITAL_COMMUNITY): Payer: Self-pay

## 2022-07-16 ENCOUNTER — Ambulatory Visit (HOSPITAL_BASED_OUTPATIENT_CLINIC_OR_DEPARTMENT_OTHER): Payer: Medicare Other | Admitting: Certified Registered Nurse Anesthetist

## 2022-07-16 ENCOUNTER — Ambulatory Visit (HOSPITAL_COMMUNITY): Payer: Medicare Other | Admitting: Physician Assistant

## 2022-07-16 ENCOUNTER — Encounter (HOSPITAL_COMMUNITY)
Admission: RE | Disposition: A | Discharge status: Home / Self Care | Payer: Self-pay | Source: Ambulatory Visit | Attending: Neurosurgery

## 2022-07-16 ENCOUNTER — Observation Stay (HOSPITAL_COMMUNITY)
Admission: RE | Admit: 2022-07-16 | Discharge: 2022-07-18 | Disposition: A | Discharge status: Home / Self Care | Payer: Medicare Other | Source: Ambulatory Visit | Attending: Neurosurgery | Admitting: Neurosurgery

## 2022-07-16 DIAGNOSIS — Z950 Presence of cardiac pacemaker: Secondary | ICD-10-CM | POA: Insufficient documentation

## 2022-07-16 DIAGNOSIS — Z8673 Personal history of transient ischemic attack (TIA), and cerebral infarction without residual deficits: Secondary | ICD-10-CM | POA: Insufficient documentation

## 2022-07-16 DIAGNOSIS — Z79899 Other long term (current) drug therapy: Secondary | ICD-10-CM | POA: Insufficient documentation

## 2022-07-16 DIAGNOSIS — Z0389 Encounter for observation for other suspected diseases and conditions ruled out: Secondary | ICD-10-CM | POA: Diagnosis not present

## 2022-07-16 DIAGNOSIS — Z7982 Long term (current) use of aspirin: Secondary | ICD-10-CM | POA: Insufficient documentation

## 2022-07-16 DIAGNOSIS — Z87891 Personal history of nicotine dependence: Secondary | ICD-10-CM

## 2022-07-16 DIAGNOSIS — I10 Essential (primary) hypertension: Secondary | ICD-10-CM | POA: Diagnosis not present

## 2022-07-16 DIAGNOSIS — M48062 Spinal stenosis, lumbar region with neurogenic claudication: Principal | ICD-10-CM | POA: Insufficient documentation

## 2022-07-16 DIAGNOSIS — M48061 Spinal stenosis, lumbar region without neurogenic claudication: Secondary | ICD-10-CM

## 2022-07-16 DIAGNOSIS — M1990 Unspecified osteoarthritis, unspecified site: Secondary | ICD-10-CM | POA: Diagnosis not present

## 2022-07-16 HISTORY — DX: Spinal stenosis, lumbar region without neurogenic claudication: M48.061

## 2022-07-16 HISTORY — PX: LUMBAR LAMINECTOMY/DECOMPRESSION MICRODISCECTOMY: SHX5026

## 2022-07-16 SURGERY — LUMBAR LAMINECTOMY/DECOMPRESSION MICRODISCECTOMY 3 LEVELS
Anesthesia: General

## 2022-07-16 MED ORDER — PHENOL 1.4 % MT LIQD: 1.0000 | OROMUCOSAL | Status: DC | PRN

## 2022-07-16 MED ORDER — ACETAMINOPHEN 650 MG RE SUPP: 650.0000 mg | RECTAL | Status: DC | PRN

## 2022-07-16 MED ORDER — SUGAMMADEX SODIUM 200 MG/2ML IV SOLN
INTRAVENOUS | Status: DC | PRN
  Administered 2022-07-16: 213.2 mg via INTRAVENOUS

## 2022-07-16 MED ORDER — FENTANYL CITRATE (PF) 100 MCG/2ML IJ SOLN
25.0000 ug | INTRAMUSCULAR | Status: DC | PRN
  Administered 2022-07-16 (×3): 50 ug via INTRAVENOUS

## 2022-07-16 MED ORDER — METHOCARBAMOL 500 MG PO TABS
500.0000 mg | ORAL_TABLET | Freq: Four times a day (QID) | ORAL | Status: DC | PRN
  Administered 2022-07-16 – 2022-07-18 (×5): 500 mg via ORAL
  Filled 2022-07-16 (×5): qty 1

## 2022-07-16 MED ORDER — PHENYLEPHRINE HCL-NACL 20-0.9 MG/250ML-% IV SOLN
INTRAVENOUS | Status: DC | PRN
  Administered 2022-07-16 (×2): 80 ug via INTRAVENOUS
  Administered 2022-07-16: 50 ug/min via INTRAVENOUS
  Administered 2022-07-16: 80 ug via INTRAVENOUS

## 2022-07-16 MED ORDER — PROPOFOL 10 MG/ML IV BOLUS
INTRAVENOUS | Status: DC | PRN
  Administered 2022-07-16: 190 mg via INTRAVENOUS

## 2022-07-16 MED ORDER — CHLORHEXIDINE GLUCONATE CLOTH 2 % EX PADS: 6.0000 | MEDICATED_PAD | Freq: Once | CUTANEOUS | Status: DC

## 2022-07-16 MED ORDER — CHLORHEXIDINE GLUCONATE 0.12 % MT SOLN
OROMUCOSAL | Status: AC
  Filled 2022-07-16: qty 15

## 2022-07-16 MED ORDER — VANCOMYCIN HCL 1500 MG/300ML IV SOLN
1500.0000 mg | INTRAVENOUS | Status: AC
  Administered 2022-07-16: 1500 mg via INTRAVENOUS
  Filled 2022-07-16: qty 300

## 2022-07-16 MED ORDER — METOPROLOL SUCCINATE ER 50 MG PO TB24
50.0000 mg | ORAL_TABLET | Freq: Every day | ORAL | Status: DC
  Administered 2022-07-16 – 2022-07-17 (×2): 50 mg via ORAL
  Filled 2022-07-16 (×2): qty 1

## 2022-07-16 MED ORDER — LACTATED RINGERS IV SOLN: INTRAVENOUS | Status: DC

## 2022-07-16 MED ORDER — SODIUM CHLORIDE 0.9 % IV SOLN: INTRAVENOUS | Status: DC

## 2022-07-16 MED ORDER — DOCUSATE SODIUM 100 MG PO CAPS
100.0000 mg | ORAL_CAPSULE | Freq: Two times a day (BID) | ORAL | Status: DC
  Administered 2022-07-16 – 2022-07-18 (×4): 100 mg via ORAL
  Filled 2022-07-16 (×4): qty 1

## 2022-07-16 MED ORDER — AMLODIPINE BESYLATE 10 MG PO TABS
10.0000 mg | ORAL_TABLET | Freq: Every day | ORAL | Status: DC
  Administered 2022-07-18: 10 mg via ORAL
  Filled 2022-07-16 (×2): qty 1

## 2022-07-16 MED ORDER — ORAL CARE MOUTH RINSE: 15.0000 mL | Freq: Once | OROMUCOSAL | Status: AC

## 2022-07-16 MED ORDER — OXYCODONE HCL 5 MG PO TABS
5.0000 mg | ORAL_TABLET | ORAL | Status: DC | PRN
  Administered 2022-07-16 – 2022-07-18 (×4): 5 mg via ORAL
  Filled 2022-07-16 (×3): qty 1

## 2022-07-16 MED ORDER — KETOROLAC TROMETHAMINE 15 MG/ML IJ SOLN
7.5000 mg | Freq: Four times a day (QID) | INTRAMUSCULAR | Status: AC
  Administered 2022-07-16 – 2022-07-17 (×3): 7.5 mg via INTRAVENOUS
  Filled 2022-07-16 (×3): qty 1

## 2022-07-16 MED ORDER — ACETAMINOPHEN 500 MG PO TABS
1000.0000 mg | ORAL_TABLET | Freq: Once | ORAL | Status: AC
  Administered 2022-07-16: 1000 mg via ORAL
  Filled 2022-07-16: qty 2

## 2022-07-16 MED ORDER — ROCURONIUM BROMIDE 10 MG/ML (PF) SYRINGE
PREFILLED_SYRINGE | INTRAVENOUS | Status: AC
  Filled 2022-07-16: qty 10

## 2022-07-16 MED ORDER — SODIUM CHLORIDE 0.9% FLUSH: 3.0000 mL | Freq: Two times a day (BID) | INTRAVENOUS | Status: DC

## 2022-07-16 MED ORDER — PANTOPRAZOLE SODIUM 40 MG PO TBEC
40.0000 mg | DELAYED_RELEASE_TABLET | Freq: Every day | ORAL | Status: DC
  Administered 2022-07-18: 40 mg via ORAL
  Filled 2022-07-16 (×2): qty 1

## 2022-07-16 MED ORDER — ZOLPIDEM TARTRATE 5 MG PO TABS: 10.0000 mg | ORAL_TABLET | Freq: Every evening | ORAL | Status: DC | PRN

## 2022-07-16 MED ORDER — ASPIRIN 81 MG PO TBEC: 81.0000 mg | DELAYED_RELEASE_TABLET | Freq: Every day | ORAL | Status: DC

## 2022-07-16 MED ORDER — THROMBIN 5000 UNITS EX SOLR
CUTANEOUS | Status: AC
  Filled 2022-07-16: qty 10000

## 2022-07-16 MED ORDER — POLYETHYLENE GLYCOL 3350 17 G PO PACK
17.0000 g | PACK | Freq: Every day | ORAL | Status: DC | PRN
  Administered 2022-07-16: 17 g via ORAL
  Filled 2022-07-16: qty 1

## 2022-07-16 MED ORDER — CHLORHEXIDINE GLUCONATE 0.12 % MT SOLN
15.0000 mL | Freq: Once | OROMUCOSAL | Status: AC
  Administered 2022-07-16: 15 mL via OROMUCOSAL

## 2022-07-16 MED ORDER — PROPOFOL 10 MG/ML IV BOLUS
INTRAVENOUS | Status: AC
  Filled 2022-07-16: qty 20

## 2022-07-16 MED ORDER — MENTHOL 3 MG MT LOZG: 1.0000 | LOZENGE | OROMUCOSAL | Status: DC | PRN

## 2022-07-16 MED ORDER — BUPIVACAINE HCL (PF) 0.5 % IJ SOLN
INTRAMUSCULAR | Status: DC | PRN
  Administered 2022-07-16: 20 mL

## 2022-07-16 MED ORDER — SODIUM CHLORIDE 0.9 % IV SOLN: 250.0000 mL | INTRAVENOUS | Status: DC

## 2022-07-16 MED ORDER — ROCURONIUM BROMIDE 10 MG/ML (PF) SYRINGE
PREFILLED_SYRINGE | INTRAVENOUS | Status: DC | PRN
  Administered 2022-07-16: 30 mg via INTRAVENOUS
  Administered 2022-07-16: 20 mg via INTRAVENOUS
  Administered 2022-07-16: 70 mg via INTRAVENOUS

## 2022-07-16 MED ORDER — FENTANYL CITRATE (PF) 250 MCG/5ML IJ SOLN
INTRAMUSCULAR | Status: DC | PRN
  Administered 2022-07-16: 50 ug via INTRAVENOUS
  Administered 2022-07-16: 100 ug via INTRAVENOUS
  Administered 2022-07-16: 50 ug via INTRAVENOUS
  Administered 2022-07-16 (×2): 25 ug via INTRAVENOUS

## 2022-07-16 MED ORDER — FENTANYL CITRATE (PF) 250 MCG/5ML IJ SOLN
INTRAMUSCULAR | Status: AC
  Filled 2022-07-16: qty 5

## 2022-07-16 MED ORDER — ONDANSETRON HCL 4 MG/2ML IJ SOLN: 4.0000 mg | Freq: Four times a day (QID) | INTRAMUSCULAR | Status: DC | PRN

## 2022-07-16 MED ORDER — PRAVASTATIN SODIUM 10 MG PO TABS
10.0000 mg | ORAL_TABLET | Freq: Every day | ORAL | Status: DC
  Administered 2022-07-16 – 2022-07-17 (×2): 10 mg via ORAL
  Filled 2022-07-16 (×2): qty 1

## 2022-07-16 MED ORDER — HYDROMORPHONE HCL 1 MG/ML IJ SOLN
0.5000 mg | INTRAMUSCULAR | Status: DC | PRN
  Administered 2022-07-16 – 2022-07-17 (×2): 0.5 mg via INTRAVENOUS
  Filled 2022-07-16 (×2): qty 0.5

## 2022-07-16 MED ORDER — SODIUM CHLORIDE 0.9% FLUSH: 3.0000 mL | INTRAVENOUS | Status: DC | PRN

## 2022-07-16 MED ORDER — ALLOPURINOL 100 MG PO TABS
100.0000 mg | ORAL_TABLET | Freq: Every day | ORAL | Status: DC
  Administered 2022-07-18: 100 mg via ORAL
  Filled 2022-07-16 (×2): qty 1

## 2022-07-16 MED ORDER — HYDROCHLOROTHIAZIDE 12.5 MG PO TABS
12.5000 mg | ORAL_TABLET | Freq: Every day | ORAL | Status: DC
  Administered 2022-07-18: 12.5 mg via ORAL
  Filled 2022-07-16 (×3): qty 1

## 2022-07-16 MED ORDER — ACETAMINOPHEN 10 MG/ML IV SOLN: 1000.0000 mg | Freq: Once | INTRAVENOUS | Status: DC | PRN

## 2022-07-16 MED ORDER — FENTANYL CITRATE (PF) 100 MCG/2ML IJ SOLN
INTRAMUSCULAR | Status: AC
  Filled 2022-07-16: qty 2

## 2022-07-16 MED ORDER — LIDOCAINE-EPINEPHRINE 1 %-1:100000 IJ SOLN
INTRAMUSCULAR | Status: AC
  Filled 2022-07-16: qty 1

## 2022-07-16 MED ORDER — METHOCARBAMOL 1000 MG/10ML IJ SOLN: 500.0000 mg | Freq: Four times a day (QID) | INTRAVENOUS | Status: DC | PRN

## 2022-07-16 MED ORDER — COLCHICINE 0.6 MG PO TABS: 0.6000 mg | ORAL_TABLET | Freq: Every day | ORAL | Status: DC | PRN

## 2022-07-16 MED ORDER — LIDOCAINE 2% (20 MG/ML) 5 ML SYRINGE
INTRAMUSCULAR | Status: DC | PRN
  Administered 2022-07-16: 60 mg via INTRAVENOUS

## 2022-07-16 MED ORDER — VITAMIN B-12 1000 MCG PO TABS
1000.0000 ug | ORAL_TABLET | Freq: Every day | ORAL | Status: DC
  Administered 2022-07-18: 1000 ug via ORAL
  Filled 2022-07-16 (×2): qty 1

## 2022-07-16 MED ORDER — FLEET ENEMA 7-19 GM/118ML RE ENEM: 1.0000 | ENEMA | Freq: Once | RECTAL | Status: DC | PRN

## 2022-07-16 MED ORDER — THROMBIN 5000 UNITS EX SOLR
OROMUCOSAL | Status: DC | PRN
  Administered 2022-07-16: 5 mL via TOPICAL

## 2022-07-16 MED ORDER — ONDANSETRON HCL 4 MG/2ML IJ SOLN
INTRAMUSCULAR | Status: AC
  Filled 2022-07-16: qty 2

## 2022-07-16 MED ORDER — VANCOMYCIN HCL 1250 MG/250ML IV SOLN
1250.0000 mg | INTRAVENOUS | Status: DC
  Administered 2022-07-17: 1250 mg via INTRAVENOUS
  Filled 2022-07-16: qty 250

## 2022-07-16 MED ORDER — ACETAMINOPHEN 325 MG PO TABS
650.0000 mg | ORAL_TABLET | ORAL | Status: DC | PRN
  Administered 2022-07-17 (×2): 650 mg via ORAL
  Filled 2022-07-16 (×3): qty 2

## 2022-07-16 MED ORDER — MIDAZOLAM HCL 5 MG/5ML IJ SOLN
INTRAMUSCULAR | Status: DC | PRN
  Administered 2022-07-16: 2 mg via INTRAVENOUS

## 2022-07-16 MED ORDER — LIDOCAINE-EPINEPHRINE 1 %-1:100000 IJ SOLN
INTRAMUSCULAR | Status: DC | PRN
  Administered 2022-07-16: 10 mL

## 2022-07-16 MED ORDER — LIDOCAINE 2% (20 MG/ML) 5 ML SYRINGE
INTRAMUSCULAR | Status: AC
  Filled 2022-07-16: qty 5

## 2022-07-16 MED ORDER — ONDANSETRON HCL 4 MG PO TABS: 4.0000 mg | ORAL_TABLET | Freq: Four times a day (QID) | ORAL | Status: DC | PRN

## 2022-07-16 MED ORDER — VANCOMYCIN HCL IN DEXTROSE 1-5 GM/200ML-% IV SOLN
INTRAVENOUS | Status: AC
  Filled 2022-07-16: qty 200

## 2022-07-16 MED ORDER — OXYCODONE HCL 5 MG PO TABS
10.0000 mg | ORAL_TABLET | ORAL | Status: DC | PRN
  Administered 2022-07-17 – 2022-07-18 (×4): 10 mg via ORAL
  Filled 2022-07-16 (×7): qty 2

## 2022-07-16 MED ORDER — CELECOXIB 200 MG PO CAPS
200.0000 mg | ORAL_CAPSULE | Freq: Once | ORAL | Status: AC
  Administered 2022-07-16: 200 mg via ORAL
  Filled 2022-07-16: qty 1

## 2022-07-16 MED ORDER — ONDANSETRON HCL 4 MG/2ML IJ SOLN
INTRAMUSCULAR | Status: DC | PRN
  Administered 2022-07-16: 4 mg via INTRAVENOUS

## 2022-07-16 MED ORDER — MIDAZOLAM HCL 2 MG/2ML IJ SOLN
INTRAMUSCULAR | Status: AC
  Filled 2022-07-16: qty 2

## 2022-07-16 MED ORDER — BUPIVACAINE HCL (PF) 0.5 % IJ SOLN
INTRAMUSCULAR | Status: AC
  Filled 2022-07-16: qty 30

## 2022-07-16 MED ORDER — MELOXICAM 7.5 MG PO TABS: 7.5000 mg | ORAL_TABLET | Freq: Two times a day (BID) | ORAL | Status: DC

## 2022-07-16 MED ORDER — METHYLPREDNISOLONE ACETATE 80 MG/ML IJ SUSP
INTRAMUSCULAR | Status: AC
  Filled 2022-07-16: qty 1

## 2022-07-16 MED ORDER — 0.9 % SODIUM CHLORIDE (POUR BTL) OPTIME
TOPICAL | Status: DC | PRN
  Administered 2022-07-16: 1000 mL

## 2022-07-16 SURGICAL SUPPLY — 58 items
ADH SKN CLS APL DERMABOND .7 (GAUZE/BANDAGES/DRESSINGS) ×1
APL SKNCLS STERI-STRIP NONHPOA (GAUZE/BANDAGES/DRESSINGS) ×1
BAG COUNTER SPONGE SURGICOUNT (BAG) ×1 IMPLANT
BAG SPNG CNTER NS LX DISP (BAG) ×1
BENZOIN TINCTURE PRP APPL 2/3 (GAUZE/BANDAGES/DRESSINGS) ×1 IMPLANT
BLADE CLIPPER SURG (BLADE) IMPLANT
BUR CARBIDE MATCH 3.0 (BURR) IMPLANT
BUR PRECISION FLUTE 5.0 (BURR) IMPLANT
CANISTER SUCT 3000ML PPV (MISCELLANEOUS) ×1 IMPLANT
DERMABOND ADVANCED .7 DNX12 (GAUZE/BANDAGES/DRESSINGS) IMPLANT
DRAIN JACKSON PRATT 10MM FLAT (MISCELLANEOUS) IMPLANT
DRAPE LAPAROTOMY 100X72X124 (DRAPES) ×1 IMPLANT
DRAPE MICROSCOPE SLANT 54X150 (MISCELLANEOUS) ×1 IMPLANT
DRAPE SURG 17X23 STRL (DRAPES) ×1 IMPLANT
DRESSING MEPILEX FLEX 4X4 (GAUZE/BANDAGES/DRESSINGS) IMPLANT
DRSG MEPILEX FLEX 4X4 (GAUZE/BANDAGES/DRESSINGS)
DRSG OPSITE POSTOP 3X4 (GAUZE/BANDAGES/DRESSINGS) ×1 IMPLANT
DRSG OPSITE POSTOP 4X8 (GAUZE/BANDAGES/DRESSINGS) IMPLANT
DURAPREP 26ML APPLICATOR (WOUND CARE) ×1 IMPLANT
ELECT BLADE INSULATED 4IN (ELECTROSURGICAL) ×1
ELECT REM PT RETURN 9FT ADLT (ELECTROSURGICAL) ×1
ELECTRODE BLADE INSULATED 4IN (ELECTROSURGICAL) ×1 IMPLANT
ELECTRODE REM PT RTRN 9FT ADLT (ELECTROSURGICAL) ×1 IMPLANT
EVACUATOR SILICONE 100CC (DRAIN) IMPLANT
GAUZE 4X4 16PLY ~~LOC~~+RFID DBL (SPONGE) IMPLANT
GLOVE BIO SURGEON STRL SZ7 (GLOVE) ×2 IMPLANT
GLOVE BIOGEL PI IND STRL 7.0 (GLOVE) ×1 IMPLANT
GLOVE BIOGEL PI IND STRL 7.5 (GLOVE) ×3 IMPLANT
GLOVE ECLIPSE 7.5 STRL STRAW (GLOVE) ×1 IMPLANT
GLOVE EXAM NITRILE XL STR (GLOVE) IMPLANT
GOWN STRL REUS W/ TWL LRG LVL3 (GOWN DISPOSABLE) ×2 IMPLANT
GOWN STRL REUS W/ TWL XL LVL3 (GOWN DISPOSABLE) ×1 IMPLANT
GOWN STRL REUS W/TWL 2XL LVL3 (GOWN DISPOSABLE) IMPLANT
GOWN STRL REUS W/TWL LRG LVL3 (GOWN DISPOSABLE) ×2
GOWN STRL REUS W/TWL XL LVL3 (GOWN DISPOSABLE) ×1
HEMOSTAT POWDER KIT SURGIFOAM (HEMOSTASIS) ×1 IMPLANT
KIT BASIN OR (CUSTOM PROCEDURE TRAY) ×1 IMPLANT
KIT TURNOVER KIT B (KITS) ×1 IMPLANT
NDL SPNL 18GX3.5 QUINCKE PK (NEEDLE) IMPLANT
NEEDLE HYPO 22GX1.5 SAFETY (NEEDLE) ×1 IMPLANT
NEEDLE SPNL 18GX3.5 QUINCKE PK (NEEDLE) IMPLANT
NS IRRIG 1000ML POUR BTL (IV SOLUTION) ×1 IMPLANT
PACK LAMINECTOMY NEURO (CUSTOM PROCEDURE TRAY) ×1 IMPLANT
PAD ARMBOARD 7.5X6 YLW CONV (MISCELLANEOUS) ×3 IMPLANT
SOL ELECTROSURG ANTI STICK (MISCELLANEOUS) ×1
SOLUTION ELECTROSURG ANTI STCK (MISCELLANEOUS) ×1 IMPLANT
SPIKE FLUID TRANSFER (MISCELLANEOUS) ×1 IMPLANT
SPONGE SURGIFOAM ABS GEL SZ50 (HEMOSTASIS) IMPLANT
SPONGE T-LAP 4X18 ~~LOC~~+RFID (SPONGE) IMPLANT
STRIP CLOSURE SKIN 1/2X4 (GAUZE/BANDAGES/DRESSINGS) ×1 IMPLANT
SUT MNCRL AB 4-0 PS2 18 (SUTURE) ×1 IMPLANT
SUT VIC AB 0 CT1 18XCR BRD8 (SUTURE) ×1 IMPLANT
SUT VIC AB 0 CT1 8-18 (SUTURE) ×2
SUT VIC AB 2-0 CP2 18 (SUTURE) ×1 IMPLANT
SYR 3ML LL SCALE MARK (SYRINGE) IMPLANT
TOWEL GREEN STERILE (TOWEL DISPOSABLE) ×1 IMPLANT
TOWEL GREEN STERILE FF (TOWEL DISPOSABLE) ×1 IMPLANT
WATER STERILE IRR 1000ML POUR (IV SOLUTION) ×1 IMPLANT

## 2022-07-16 NOTE — Transfer of Care (Signed)
Immediate Anesthesia Transfer of Care Note  Patient: Clarence Hill  Procedure(s) Performed: Lumbar two-three, Lumbar three-four Lumbar four-five Laminectomy ,Bilateral  Medial Facetectomy  Patient Location: PACU  Anesthesia Type:General  Level of Consciousness: awake, alert , and oriented  Airway & Oxygen Therapy: Patient Spontanous Breathing and Patient connected to face mask oxygen  Post-op Assessment: Report given to RN, Post -op Vital signs reviewed and stable, Patient moving all extremities X 4, and Patient able to stick tongue midline  Post vital signs: Reviewed  Last Vitals:  Vitals Value Taken Time  BP 141/85 07/16/22 1720  Temp 98.6   Pulse 68 07/16/22 1721  Resp 12 07/16/22 1721  SpO2 97 % 07/16/22 1721  Vitals shown include unvalidated device data.  Last Pain:  Vitals:   07/16/22 0959  TempSrc:   PainSc: 6          Complications: No notable events documented.
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
## 2022-07-16 NOTE — Progress Notes (Signed)
Pt took his hydrochlorothiazide (MICROZIDE) 12.5 MG capsule this am. His BP 151/92, HR 64. Dr Nance Pew is aware.

## 2022-07-16 NOTE — Progress Notes (Addendum)
Pharmacy Antibiotic Note  Clarence Hill is a 77 y.o. male admitted on 07/16/2022 for spinal surgery for lumbar stenosis.  S/p L2-3, L3-4, L4-5 laminectomy, bilateral medial facetectomy and foraminotomy.  Pharmacy has been consulted for Vancomycin dosing for surgical prophylaxis.  Had JP drain placed, thus to continue vancomycin until discontinued by physician.    SCr 1.5 (07/11/22) and prior to that 1.24-1.35 ~6-8 months ago.  Preop Vanc 1500 mg x1 given 6/18 @ 1047AM.  BMI = 31.87 kg/m2  Plan: Vancomycin 1250 mg IV q24hr, next dose due tomorrow 07/17/22 at 10:00AM. Predicted AUC 543, Scr used 1.5, Vd used 0.5L/kg (due to BMI >30) F/u when drain removed,  duration of therapy.  Height: 6' (182.9 cm) Weight: 106.6 kg (235 lb) IBW/kg (Calculated) : 77.6  Temp (24hrs), Avg:98.2 F (36.8 C), Min:97.5 F (36.4 C), Max:98.6 F (37 C)  Recent Labs  Lab 07/11/22 1019  WBC 4.8  CREATININE 1.50*    Estimated Creatinine Clearance: 53.7 mL/min (A) (by C-G formula based on SCr of 1.5 mg/dL (H)).    Allergies  Allergen Reactions   Prednisone Hives and Rash    Made him feel crazy   Levofloxacin Other (See Comments)    Unknown reaction   Penicillins Other (See Comments)    Unknown reaction Childhood   Pepcid [Famotidine] Cough   Penicillin G Benzathine & Proc Rash    Antimicrobials this admission: Vancomycin 6/18>>  Dose adjustments this admission:   Microbiology results: 6/18 Surgical MRSA PCR: negative:  SA : neg.  Thank you for allowing pharmacy to be a part of this patient's care.  Noah Delaine, RPh Clinical Pharmacist 07/16/2022 6:59 PM

## 2022-07-16 NOTE — Op Note (Signed)
PREOP DIAGNOSIS: Lumbar spinal stenosis with neurogenic claudication  POSTOP DIAGNOSIS: Lumbar spinal stenosis with neurogenic claudication  PROCEDURE: 1. L2-3 laminectomy, bilateral medial facetectomy and foraminotomy 2. L3-4 laminectomy, bilateral medial facetectomy and foraminotomy 3. L4-5 laminectomy, bilateral medial facetectomy and foraminotomy 4.  Use of intraoperative microscope for microdissection  SURGEON: Dr. Hoyt Koch, MD  ASSISTANT: Patrici Ranks, PA.  please note, there were no qualified trainees available to assist with the procedure.  An assistant was required for aid in retraction of the neural elements.   ANESTHESIA: General Endotracheal  EBL: 200 mL  SPECIMENS: None  DRAINS: 10 flat JP drain  COMPLICATIONS: None  CONDITION: Stable to PCAU  HISTORY: Clarence Hill is a 77 y.o. male who initially presented to the outpatient clinic with symptoms consistent with neurogenic claudication. MRI showed severe lumbar stenosis at L3-4 and L4-5 and moderate to severe stenosis at L2-3. Treatment options were discussed and the patient elected to proceed with surgical decompression by laminectomy and foraminotomies at L2-3, L3-4, and L4-5.Marland Kitchen  PROCEDURE IN DETAIL: After informed consent was obtained and witnessed, the patient was brought to the operating room. After induction of general anesthesia, the patient was positioned on the operative table in the prone position with all pressure points meticulously padded. The skin of the low back was then prepped and draped in the usual sterile fashion.  Using x-ray with spinal needles, the correct levels were identified and marked out on the skin, and after timeout was conducted, the skin was infiltrated with local anesthetic. Skin incision was then made sharply and Bovie electrocautery was used to dissect the subcutaneous tissue until the lumbodorsal fascia was identified. The fascia was then incised using Bovie electrocautery and  the lamina at the L2, L3, L4, and L5 levels were identified and dissection was carried out in the subperiosteal plane. Self-retaining retractor was then placed, and intraoperative x-ray was taken to confirm we were at the correct levels.  Microscope was then introduced into the field to allow for intraoperative microdissection.  Interspinous ligament was removed with Leksell rongeur.  Using a high-speed drill, inferior lamina of L4 and superior lamina of L5 was removed, and bilateral medial facetectomies were performed. The ligamentum flavum was then identified and removed and the lateral edge of the thecal sac was identified bilaterally. Using a combination of the high-speed drill and Rogers, good lateral decompression was completed. Kerrison rongeurs were then used to complete foraminotomies at L4-5 bilaterally. The decompression was confirmed using a small ball tip dissector.  Meticulous hemostasis was obtained.  Attention was then turned to the L3-4 interspace.  Interspinous ligament was removed with Leksell rongeur.  Using a high-speed drill, inferior lamina of L3 and superior lamina of L4 was removed, and bilateral medial facetectomies were performed. The ligamentum flavum was heavily calcified with what appeared to be gouty material as well.  Ligamentum flavum was then removed and the lateral edge of the thecal sac was identified bilaterally. Using a combination of the high-speed drill and Rogers, good lateral decompression was completed. Kerrison rongeurs were then used to complete foraminotomies bilaterally. The decompression was confirmed using a small ball tip dissector.  Meticulous hemostasis was obtained.  Attention was then turned to the L2-3 interspace.  Interspinous ligament was removed with Leksell rongeur.  Using a high-speed drill, inferior lamina of L2 and superior lamina of L3 was removed, and bilateral medial facetectomies were performed. The ligamentum flavum was heavily calcified with  what appeared to be gouty material as well.  Ligamentum flavum was then removed and the lateral edge of the thecal sac was identified bilaterally. Using a combination of the high-speed drill and Rogers, good lateral decompression was completed. Kerrison rongeurs were then used to complete foraminotomies bilaterally. The decompression was confirmed using a small ball tip dissector.  Meticulous hemostasis was obtained.  Hemostasis was then secured using a combination of morcellized Gelfoam and thrombin and bipolar electrocautery. The wound was irrigated copiously.  10 flat JP drain was placed in the subfascial space and tunneled out the skin and secured with a stitch.  Marcaine was injected into the paraspinous muscles and soft tissues.  Self-retaining retractor was then removed, and the wound is closed in layers using a combination of interrupted 0 Vicryl and 2-0 Vicryl stitches followed by 4-0 monocryl in subcuticular manner and Dermabond.  A sterile dressing was placed.   At the end of the case all sponge, needle, and instrument counts were correct. The patient was then transferred to the stretcher and taken to the postanesthesia care unit in stable hemodynamic condition.

## 2022-07-16 NOTE — Anesthesia Procedure Notes (Signed)
Procedure Name: Intubation Date/Time: 07/16/2022 2:01 PM  Performed by: Cy Blamer, CRNAPre-anesthesia Checklist: Patient identified, Emergency Drugs available, Suction available and Patient being monitored Patient Re-evaluated:Patient Re-evaluated prior to induction Oxygen Delivery Method: Circle system utilized Preoxygenation: Pre-oxygenation with 100% oxygen Induction Type: IV induction Ventilation: Two handed mask ventilation required Laryngoscope Size: Miller and 2 Grade View: Grade I Tube type: Oral Tube size: 7.0 mm Number of attempts: 1 Airway Equipment and Method: Stylet and Bite block Placement Confirmation: ETT inserted through vocal cords under direct vision, positive ETCO2 and breath sounds checked- equal and bilateral Secured at: 23 cm Tube secured with: Tape Dental Injury: Injury to lip  Comments: 2 hand mask required for long facial structures w/large nasal bridge, small right lower lip laceration observed after masking.

## 2022-07-17 ENCOUNTER — Encounter (HOSPITAL_COMMUNITY): Payer: Self-pay | Admitting: Neurosurgery

## 2022-07-17 DIAGNOSIS — M48062 Spinal stenosis, lumbar region with neurogenic claudication: Secondary | ICD-10-CM | POA: Diagnosis not present

## 2022-07-17 DIAGNOSIS — Z950 Presence of cardiac pacemaker: Secondary | ICD-10-CM | POA: Diagnosis not present

## 2022-07-17 DIAGNOSIS — Z8673 Personal history of transient ischemic attack (TIA), and cerebral infarction without residual deficits: Secondary | ICD-10-CM | POA: Diagnosis not present

## 2022-07-17 DIAGNOSIS — I10 Essential (primary) hypertension: Secondary | ICD-10-CM | POA: Diagnosis not present

## 2022-07-17 DIAGNOSIS — Z79899 Other long term (current) drug therapy: Secondary | ICD-10-CM | POA: Diagnosis not present

## 2022-07-17 DIAGNOSIS — Z7982 Long term (current) use of aspirin: Secondary | ICD-10-CM | POA: Diagnosis not present

## 2022-07-17 LAB — CBC WITH DIFFERENTIAL/PLATELET
Abs Immature Granulocytes: 0.01 10*3/uL (ref 0.00–0.07)
Basophils Absolute: 0 10*3/uL (ref 0.0–0.1)
Basophils Relative: 0 %
Eosinophils Absolute: 0.1 10*3/uL (ref 0.0–0.5)
Eosinophils Relative: 1 %
HCT: 37.6 % — ABNORMAL LOW (ref 39.0–52.0)
Hemoglobin: 12.6 g/dL — ABNORMAL LOW (ref 13.0–17.0)
Immature Granulocytes: 0 %
Lymphocytes Relative: 20 %
Lymphs Abs: 1.4 10*3/uL (ref 0.7–4.0)
MCH: 31.4 pg (ref 26.0–34.0)
MCHC: 33.5 g/dL (ref 30.0–36.0)
MCV: 93.8 fL (ref 80.0–100.0)
Monocytes Absolute: 0.9 10*3/uL (ref 0.1–1.0)
Monocytes Relative: 12 %
Neutro Abs: 4.7 10*3/uL (ref 1.7–7.7)
Neutrophils Relative %: 67 %
Platelets: 192 10*3/uL (ref 150–400)
RBC: 4.01 MIL/uL — ABNORMAL LOW (ref 4.22–5.81)
RDW: 13.4 % (ref 11.5–15.5)
WBC: 7.1 10*3/uL (ref 4.0–10.5)
nRBC: 0 % (ref 0.0–0.2)

## 2022-07-17 LAB — BASIC METABOLIC PANEL
Anion gap: 8 (ref 5–15)
BUN: 23 mg/dL (ref 8–23)
CO2: 24 mmol/L (ref 22–32)
Calcium: 8.2 mg/dL — ABNORMAL LOW (ref 8.9–10.3)
Chloride: 106 mmol/L (ref 98–111)
Creatinine, Ser: 1.61 mg/dL — ABNORMAL HIGH (ref 0.61–1.24)
GFR, Estimated: 44 mL/min — ABNORMAL LOW (ref >60)
Glucose, Bld: 100 mg/dL — ABNORMAL HIGH (ref 70–99)
Potassium: 3.2 mmol/L — ABNORMAL LOW (ref 3.5–5.1)
Sodium: 138 mmol/L (ref 135–145)

## 2022-07-17 MED ORDER — VANCOMYCIN HCL IN DEXTROSE 1-5 GM/200ML-% IV SOLN: 1000.0000 mg | INTRAVENOUS | Status: DC

## 2022-07-17 NOTE — Progress Notes (Signed)
   Providing Compassionate, Quality Care - Together  NEUROSURGERY PROGRESS NOTE   S: No issues overnight.  O: EXAM:  BP (!) 102/58 (BP Location: Left Arm)   Pulse (!) 58   Temp 97.8 F (36.6 C) (Oral)   Resp 18   Ht 6' (1.829 m)   Wt 106.6 kg   SpO2 95%   BMI 31.87 kg/m   Awake, alert, oriented  Speech fluent, appropriate  5/5 BUE/BLE  Ambulating w/o difficulty Wound c/d/I JP drain in place  ASSESSMENT:  77 y.o. male s/p L2-3, L3-4, L4-5 lami POD#1  PLAN: - Continue supportive care -Continue JP -PT/OT as tolerated  Call with questions or concerns.  Patrici Ranks, [facility]

## 2022-07-17 NOTE — Anesthesia Postprocedure Evaluation (Signed)
Anesthesia Post Note  Patient: Raidon Goebel  Procedure(s) Performed: Lumbar two-three, Lumbar three-four Lumbar four-five Laminectomy ,Bilateral  Medial Facetectomy     Patient location during evaluation: PACU Anesthesia Type: General Level of consciousness: awake and alert Pain management: pain level controlled Vital Signs Assessment: post-procedure vital signs reviewed and stable Respiratory status: spontaneous breathing, nonlabored ventilation, respiratory function stable and patient connected to nasal cannula oxygen Cardiovascular status: blood pressure returned to baseline and stable Postop Assessment: no apparent nausea or vomiting Anesthetic complications: no   No notable events documented.  Last Vitals:  Vitals:   07/17/22 0612 07/17/22 0803  BP: 113/65 (!) 102/58  Pulse: (!) 56 (!) 58  Resp: 20 18  Temp: 36.9 C 36.6 C  SpO2: 95% 95%    Last Pain:  Vitals:   07/17/22 0803  TempSrc: Oral  PainSc:                  Nelle Don Taleen Prosser

## 2022-07-17 NOTE — Evaluation (Signed)
Occupational Therapy Evaluation Patient Details Name: Clarence Hill MRN: [id number] DOB: 1945-07-08 Today's Date: 07/17/2022   History of Present Illness 77 y.o. male admitted on 07/16/2022 for spinal surgery for lumbar stenosis.   S/p L2-3, L3-4, L4-5 laminectomy, bilateral medial facetectomy and foraminotomy. PMH of gout, HTN, TIA, GERD, Arhitis.   Clinical Impression   Pt admitted for above dx, PTA pt reports living with spouse and ambulating no AD but furniture walks and being Mod I in bADLs/iADLs. Pt currently ambulating with supervision and completing LBD with Supervision with use of figure four technique. Edcuated pt on POB precautions, by end of session he can recall 2/3 precautions without cueing, hand out provided. Pt demonstrated and verbalized understanding of precautions and functional transfers, as well as precautions during functional bADLs. Pt has no further acute skilled OT needs at this time. No follow up OT recommended.     Recommendations for follow up therapy are one component of a multi-disciplinary discharge planning process, led by the attending physician.  Recommendations may be updated based on patient status, additional functional criteria and insurance authorization.   Assistance Recommended at Discharge Set up Supervision/Assistance  Patient can return home with the following Assistance with cooking/housework    Functional Status Assessment  Patient has not had a recent decline in their functional status  Equipment Recommendations  None recommended by OT (Pt has rec DME)    Recommendations for Other Services       Precautions / Restrictions Precautions Precautions: Back Precaution Booklet Issued: Yes (comment) Precaution Comments: Pt recalls 2/3 without cues Restrictions Weight Bearing Restrictions: No      Mobility Bed Mobility Overal bed mobility: Modified Independent             General bed mobility comments: pt demonstrated good use of log  roll without any cues    Transfers Overall transfer level: Needs assistance Equipment used: None Transfers: Sit to/from Stand Sit to Stand: Supervision           General transfer comment: STS x2      Balance Overall balance assessment: Needs assistance Sitting-balance support: Feet supported Sitting balance-Leahy Scale: Normal     Standing balance support: During functional activity Standing balance-Leahy Scale: Fair                             ADL either performed or assessed with clinical judgement   ADL Overall ADL's : Needs assistance/impaired Eating/Feeding: Independent;Sitting   Grooming: Standing;Supervision/safety   Upper Body Bathing: Sitting;Modified independent   Lower Body Bathing: Sitting/lateral leans;Supervison/ safety;Set up   Upper Body Dressing : Modified independent;Sitting   Lower Body Dressing: Supervision/safety;Set up;Sitting/lateral leans   Toilet Transfer: Ambulation;Supervision/safety;Rolling walker (2 wheels)   Toileting- Clothing Manipulation and Hygiene: Sitting/lateral lean;Minimal assistance Toileting - Clothing Manipulation Details (indicate cue type and reason): Pt with trouble reaching rear pericare area     Functional mobility during ADLs: Supervision/safety;Rolling walker (2 wheels)       Vision         Perception     Praxis      Pertinent Vitals/Pain Pain Assessment Pain Assessment: 0-10 Pain Score: 3  Pain Location: Back, incision cite Pain Descriptors / Indicators: Aching, Discomfort Pain Intervention(s): Monitored during session, Repositioned     Hand Dominance     Extremity/Trunk Assessment Upper Extremity Assessment Upper Extremity Assessment: Overall WFL for tasks assessed   Lower Extremity Assessment Lower Extremity Assessment: Defer to PT evaluation  Cervical / Trunk Assessment Cervical / Trunk Assessment: Back Surgery   Communication Communication Communication: No  difficulties   Cognition Arousal/Alertness: Awake/alert Behavior During Therapy: WFL for tasks assessed/performed Overall Cognitive Status: Within Functional Limits for tasks assessed                                       General Comments  VSS ON RA    Exercises     Shoulder Instructions      Home Living Family/patient expects to be discharged to:: Private residence Living Arrangements: Spouse/significant other Available Help at Discharge: Family;Available 24 hours/day Type of Home: House Home Access: Level entry     Home Layout: One level     Bathroom Shower/Tub: Producer, television/film/video: Standard Bathroom Accessibility: Yes How Accessible: Accessible via walker Home Equipment: Other (comment) (knee scooter)          Prior Functioning/Environment Prior Level of Function : Independent/Modified Independent             Mobility Comments: ambulated no AD but furniture walks ADLs Comments: ind        OT Problem List: Pain      OT Treatment/Interventions:      OT Goals(Current goals can be found in the care plan section) Acute Rehab OT Goals Patient Stated Goal: To go home OT Goal Formulation: With patient Time For Goal Achievement: 07/31/22 Potential to Achieve Goals: Good  OT Frequency:      Co-evaluation              AM-PAC OT "6 Clicks" Daily Activity     Outcome Measure Help from another person eating meals?: None Help from another person taking care of personal grooming?: A Little Help from another person toileting, which includes using toliet, bedpan, or urinal?: A Little Help from another person bathing (including washing, rinsing, drying)?: A Little Help from another person to put on and taking off regular upper body clothing?: None Help from another person to put on and taking off regular lower body clothing?: A Little 6 Click Score: 20   End of Session Equipment Utilized During Treatment: Gait belt;Rolling  walker (2 wheels) Nurse Communication: Mobility status  Activity Tolerance: Patient tolerated treatment well Patient left: in bed;with call bell/phone within reach;with family/visitor present  OT Visit Diagnosis: Pain Pain - part of body:  (back)                Time: 1610-9604 OT Time Calculation (min): 25 min Charges:  OT General Charges $OT Visit: 1 Visit OT Evaluation $OT Eval Moderate Complexity: 1 Mod OT Treatments $Therapeutic Activity: 8-22 mins  07/17/2022  AB, OTR/L  Acute Rehabilitation Services  Office: [phone]   Tristan Schroeder 07/17/2022, 10:32 AM

## 2022-07-17 NOTE — Progress Notes (Signed)
Pharmacy Antibiotic Note  Clarence Hill is a 77 y.o. male admitted on 07/16/2022 for spinal surgery for lumbar stenosis. S/p L2-3, L3-4, L4-5 laminectomy, bilateral medial facetectomy and foraminotomy. Pharmacy consulted for vancomycin dosing for surgical prophylaxis. Had JP drain placed, thus to continue vancomycin until discontinued by physician. SCr 1.61 this AM.  Plan: Adjust vancomycin to 1g IV q24h (goal AUC 400-550; estimated AUC 461, SCr used 1.61 Monitor clinical progress, c/s, renal function, vancomycin levels as indicated F/u drain removal and vancomycin LOT  Height: 6' (182.9 cm) Weight: 106.6 kg (235 lb) IBW/kg (Calculated) : 77.6  Temp (24hrs), Avg:98 F (36.7 C), Min:97.5 F (36.4 C), Max:98.6 F (37 C)  Recent Labs  Lab 07/11/22 1019 07/17/22 0808  WBC 4.8 7.1  CREATININE 1.50* 1.61*     Estimated Creatinine Clearance: 50 mL/min (A) (by C-G formula based on SCr of 1.61 mg/dL (H)).    Allergies  Allergen Reactions   Prednisone Hives and Rash    Made him feel crazy   Levofloxacin Other (See Comments)    Unknown reaction   Penicillins Other (See Comments)    Unknown reaction Childhood   Pepcid [Famotidine] Cough   Penicillin G Benzathine & Proc Rash    Leia Alf, PharmD, BCPS Please check AMION for all [facility] contact numbers Clinical Pharmacist 07/17/2022 9:51 AM

## 2022-07-17 NOTE — Evaluation (Signed)
Physical Therapy Evaluation and Discharge Patient Details Name: Clarence Hill MRN: [id number] DOB: Aug 13, 1945 Today's Date: 07/17/2022  History of Present Illness  77 y.o. male admitted on 07/16/2022 for spinal surgery for lumbar stenosis.   S/p L2-3, L3-4, L4-5 laminectomy, bilateral medial facetectomy and foraminotomy. PMH of gout, HTN, TIA, GERD, Arhitis.  Clinical Impression  Patient evaluated by Physical Therapy with no further acute PT needs identified. All education has been completed and the patient has no further questions. Recalls precautions and maintains throughout therapy session. Handout provided. Ambulating with light assist from RW, at mod I level. Wife present and supportive. Pt reports resolution of radicular symptoms. Adequate for d/c from mobility standpoint when medically ready. See below for any follow-up Physical Therapy or equipment needs. PT is signing off. Thank you for this referral.        Recommendations for follow up therapy are one component of a multi-disciplinary discharge planning process, led by the attending physician.  Recommendations may be updated based on patient status, additional functional criteria and insurance authorization.     Assistance Recommended at Discharge PRN  Patient can return home with the following  Assist for transportation;Assistance with cooking/housework    Equipment Recommendations None recommended by PT (Pt reports he is borrowing neighbors walker, declines hosptial to order.)  Recommendations for Other Services       Functional Status Assessment Patient has had a recent decline in their functional status and demonstrates the ability to make significant improvements in function in a reasonable and predictable amount of time.     Precautions / Restrictions Precautions Precautions: Back Precaution Booklet Issued: Yes (comment) Precaution Comments: Recalls 3/3 precautions Restrictions Weight Bearing Restrictions: No       Mobility  Bed Mobility Overal bed mobility: Modified Independent             General bed mobility comments: pt demonstrated good use of log roll without any cues    Transfers Overall transfer level: Modified independent Equipment used: Rolling walker (2 wheels) Transfers: Sit to/from Stand Sit to Stand: Modified independent (Device/Increase time)           General transfer comment: Mod with use of RW for light support upon standing.    Ambulation/Gait Ambulation/Gait assistance: Modified independent (Device/Increase time) Gait Distance (Feet): 225 Feet Assistive device: Rolling walker (2 wheels) Gait Pattern/deviations: Step-through pattern, Trunk flexed Gait velocity: decr Gait velocity interpretation: <1.8 ft/sec, indicate of risk for recurrent falls   General Gait Details: Minor trunk flexion (pt reports improved from baseline). No LOB, good foot clearance. Appropriately using RW for support.  Stairs            Wheelchair Mobility    Modified Rankin (Stroke Patients Only)       Balance Overall balance assessment: Needs assistance Sitting-balance support: Feet supported Sitting balance-Leahy Scale: Normal     Standing balance support: During functional activity Standing balance-Leahy Scale: Fair                               Pertinent Vitals/Pain Pain Assessment Pain Assessment: 0-10 Pain Score: 3  Pain Location: Back, incision cite Pain Descriptors / Indicators: Aching, Discomfort Pain Intervention(s): Monitored during session, Repositioned    Home Living Family/patient expects to be discharged to:: Private residence Living Arrangements: Spouse/significant other Available Help at Discharge: Family;Available 24 hours/day Type of Home: House Home Access: Level entry       Home Layout: One  level Home Equipment: Other (comment);Grab bars - tub/shower;Grab bars - toilet;Shower seat - built in;Shower seat (knee scooter)       Prior Function Prior Level of Function : Independent/Modified Independent             Mobility Comments: ambulated no AD but furniture walks ADLs Comments: ind     Hand Dominance        Extremity/Trunk Assessment   Upper Extremity Assessment Upper Extremity Assessment: Defer to OT evaluation    Lower Extremity Assessment Lower Extremity Assessment: Overall WFL for tasks assessed    Cervical / Trunk Assessment Cervical / Trunk Assessment: Back Surgery  Communication   Communication: No difficulties  Cognition Arousal/Alertness: Awake/alert Behavior During Therapy: WFL for tasks assessed/performed Overall Cognitive Status: Within Functional Limits for tasks assessed                                          General Comments General comments (skin integrity, edema, etc.): Reviewed precautions, activity increase, symptom awareness, position changes.    Exercises     Assessment/Plan    PT Assessment Patient does not need any further PT services  PT Problem List         PT Treatment Interventions      PT Goals (Current goals can be found in the Care Plan section)  Acute Rehab PT Goals Patient Stated Goal: Go home PT Goal Formulation: All assessment and education complete, DC therapy    Frequency       Co-evaluation               AM-PAC PT "6 Clicks" Mobility  Outcome Measure Help needed turning from your back to your side while in a flat bed without using bedrails?: None Help needed moving from lying on your back to sitting on the side of a flat bed without using bedrails?: None Help needed moving to and from a bed to a chair (including a wheelchair)?: None Help needed standing up from a chair using your arms (e.g., wheelchair or bedside chair)?: None Help needed to walk in hospital room?: None Help needed climbing 3-5 steps with a railing? : A Little 6 Click Score: 23    End of Session   Activity Tolerance: Patient tolerated  treatment well Patient left: in bed;with call bell/phone within reach;with family/visitor present Nurse Communication: Mobility status PT Visit Diagnosis: Other abnormalities of gait and mobility (R26.89);Pain Pain - part of body:  (back)    Time: 9604-5409 PT Time Calculation (min) (ACUTE ONLY): 23 min   Charges:   PT Evaluation $PT Eval Low Complexity: 1 Low PT Treatments $Therapeutic Activity: 8-22 mins        Kathlyn Sacramento, PT, DPT [facility]  Rehabilitation Services Physical Therapist Office: [phone] Website: [email or web address]   Berton Mount 07/17/2022, 12:32 PM

## 2022-07-18 DIAGNOSIS — M48062 Spinal stenosis, lumbar region with neurogenic claudication: Secondary | ICD-10-CM | POA: Diagnosis not present

## 2022-07-18 DIAGNOSIS — Z7982 Long term (current) use of aspirin: Secondary | ICD-10-CM | POA: Diagnosis not present

## 2022-07-18 DIAGNOSIS — Z950 Presence of cardiac pacemaker: Secondary | ICD-10-CM | POA: Diagnosis not present

## 2022-07-18 DIAGNOSIS — Z79899 Other long term (current) drug therapy: Secondary | ICD-10-CM | POA: Diagnosis not present

## 2022-07-18 DIAGNOSIS — Z8673 Personal history of transient ischemic attack (TIA), and cerebral infarction without residual deficits: Secondary | ICD-10-CM | POA: Diagnosis not present

## 2022-07-18 DIAGNOSIS — I10 Essential (primary) hypertension: Secondary | ICD-10-CM | POA: Diagnosis not present

## 2022-07-18 MED ORDER — METHOCARBAMOL 500 MG PO TABS: 500.0000 mg | ORAL_TABLET | Freq: Four times a day (QID) | ORAL | 2 refills | Status: DC | PRN

## 2022-07-18 MED ORDER — DOCUSATE SODIUM 100 MG PO CAPS: 100.0000 mg | ORAL_CAPSULE | Freq: Two times a day (BID) | ORAL | 0 refills | Status: DC

## 2022-07-18 MED ORDER — OXYCODONE HCL 10 MG PO TABS: 10.0000 mg | ORAL_TABLET | ORAL | 0 refills | Status: DC | PRN

## 2022-07-18 NOTE — TOC Initial Note (Signed)
Transition of Care [facility]) - Initial/Assessment Note    Patient Details  Name: Clarence Hill MRN: [id number] Date of Birth: 1945/07/07  [facility]) CM/SW Contact:    Leone Haven, RN Phone Number: 07/18/2022, 10:41 AM  Clinical Narrative:                 From home with sign other, has PCP and insurance on file, indep, has no needs.        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   Muscle cramps 02/17/2022   Spinal stenosis of lumbar region without neurogenic claudication 02/17/2022   Sciatica 01/28/2022   Elevated serum creatinine 10/22/2021   TIA (transient ischemic attack) 10/18/2021   Heat exhaustion 08/14/2021   BPH (benign prostatic hyperplasia) 08/14/2021   Weak urine stream 07/16/2021   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   Aortic atherosclerosis (HCC) 01/01/2021   Primary insomnia 01/01/2021   Arrhythmia 01/01/2021   BMI 31.0-31.9,adult 11/27/2020   Branch retinal vein occlusion of right eye 10/18/2020   Proud flesh 09/15/2019   Ascending aortic aneurysm (HCC) 09/09/2019   Hypertension    Palpitations 05/12/2017   Atypical chest pain 05/12/2017   Abnormal EKG 05/12/2017   Gastroesophageal reflux disease 05/19/2015   PCP:  Blane Ohara, MD Pharmacy:   [facility] [address] [place] - [address] [place] [address] Phone:  [phone] Fax: [phone]     Social Determinants of Health (SDOH) Social History: SDOH Screenings   Alcohol Screen: Low Risk  (09/14/2020)  Depression (PHQ2-9): Low Risk  (06/03/2022)  Tobacco Use: Medium Risk (07/17/2022)   SDOH Interventions:     Readmission Risk Interventions     No data to display

## 2022-07-18 NOTE — TOC Transition Note (Addendum)
Transition of Care [facility]) - CM/SW Discharge Note   Patient Details  Name: Clarence Hill MRN: [id number] Date of Birth: 10-Nov-1945  [facility] CM/SW Contact:  Leone Haven, RN Phone Number: 07/18/2022, 10:41 AM   Clinical Narrative:     For dc today has no needs. Per  PT/OT eval no f/u.         Patient Goals and CMS Choice      Discharge Placement                         Discharge Plan and Services Additional resources added to the After Visit Summary for                                       Social Determinants of Health (SDOH) Interventions SDOH Screenings   Alcohol Screen: Low Risk  (09/14/2020)  Depression (PHQ2-9): Low Risk  (06/03/2022)  Tobacco Use: Medium Risk (07/17/2022)     Readmission Risk Interventions     No data to display

## 2022-07-18 NOTE — Plan of Care (Signed)
  Problem: Education: Goal: Knowledge of General Education information will improve Description Including pain rating scale, medication(s)/side effects and non-pharmacologic comfort measures Outcome: Adequate for Discharge   Problem: Health Behavior/Discharge Planning: Goal: Ability to manage health-related needs will improve Outcome: Adequate for Discharge   

## 2022-07-18 NOTE — Discharge Summary (Signed)
Physician Discharge Summary  Patient ID: Clarence Hill MRN: [id number] DOB/AGE: [birth year] 77 y.o.  Admit date: 07/16/2022 Discharge date: 07/18/2022  Admission Diagnoses:    Lumbar spinal stenosis  Discharge Diagnoses:  Same Principal Problem:   Lumbar spinal stenosis   Discharged Condition: Stable  Hospital Course:  Halid Ormiston is a 77 y.o. male who underwent L2-3, L3-4, L4-5 lami on 07/16/22. Pt was recovered in PACU and transferred to [facility]. He is amenable for dc to home today.   Discharge Exam: Blood pressure 124/79, pulse 92, temperature 98.6 F (37 C), temperature source Oral, resp. rate 18, height 6' (1.829 m), weight 106.6 kg, SpO2 100 %. Awake, alert, oriented Speech fluent, appropriate 5/5 BUE/BLE SILTx4 Wound c/d/i  Disposition: Discharge disposition: 01-Home or Self Care       Discharge Instructions     Incentive spirometry RT   Complete by: As directed       Allergies as of 07/18/2022       Reactions   Prednisone Hives, Rash   Made him feel crazy   Levofloxacin Other (See Comments)   Unknown reaction   Penicillins Other (See Comments)   Unknown reaction Childhood   Pepcid [famotidine] Cough   Penicillin G Benzathine & Proc Rash        Medication List     STOP taking these medications    tiZANidine 2 MG tablet Commonly known as: ZANAFLEX       TAKE these medications    Allegra Hives 24HR 180 MG tablet Generic drug: fexofenadine Take 180 mg by mouth daily.   allopurinol 100 MG tablet Commonly known as: ZYLOPRIM TAKE 1 TABLET BY MOUTH EVERY DAY   amLODipine 10 MG tablet Commonly known as: NORVASC Take 1 tablet (10 mg total) by mouth daily.   aspirin EC 81 MG tablet Take 1 tablet (81 mg total) by mouth daily. Start taking on: July 23, 2022 What changed: These instructions start on July 23, 2022. If you are unsure what to do until then, ask your doctor or other care provider.   colchicine 0.6 MG tablet TAKE ONE TABLET  BY MOUTH DAILY AS NEEDED DURING GOUT FLARE   cyanocobalamin 1000 MCG tablet Commonly known as: VITAMIN B12 Take 1,000 mcg by mouth daily.   diphenhydramine-acetaminophen 25-500 MG Tabs tablet Commonly known as: TYLENOL PM Take 2 tablets by mouth at bedtime as needed (sleep).   docusate sodium 100 MG capsule Commonly known as: COLACE Take 1 capsule (100 mg total) by mouth 2 (two) times daily.   doxylamine (Sleep) 25 MG tablet Commonly known as: UNISOM Take 25 mg by mouth at bedtime.   hydrochlorothiazide 12.5 MG capsule Commonly known as: MICROZIDE TAKE 1 CAPSULE BY MOUTH EVERY DAY   meloxicam 7.5 MG tablet Commonly known as: MOBIC Take 1 tablet (7.5 mg total) by mouth 2 (two) times daily. Take with food. Start taking on: July 23, 2022 What changed: These instructions start on July 23, 2022. If you are unsure what to do until then, ask your doctor or other care provider.   methocarbamol 500 MG tablet Commonly known as: ROBAXIN Take 1 tablet (500 mg total) by mouth every 6 (six) hours as needed for muscle spasms.   metoprolol succinate 50 MG 24 hr tablet Commonly known as: Toprol XL Take 1 tablet (50 mg total) by mouth daily. Take with a snack at bedtime. What changed: when to take this   multivitamin with minerals tablet Take 1 tablet by mouth daily.  Oxycodone HCl 10 MG Tabs Take 1 tablet (10 mg total) by mouth every 4 (four) hours as needed for severe pain ((score 7 to 10)).   pantoprazole 40 MG tablet Commonly known as: PROTONIX Take 1 tablet (40 mg total) by mouth daily.   pravastatin 10 MG tablet Commonly known as: PRAVACHOL Take 1 tablet (10 mg total) by mouth at bedtime.   triamcinolone 0.1 % paste Commonly known as: KENALOG Use as directed 1 Application in the mouth or throat 2 (two) times daily.   zolpidem 10 MG tablet Commonly known as: AMBIEN Take 1 tablet (10 mg total) by mouth at bedtime as needed for sleep.        Follow-up Information      Bedelia Person, MD. Go to.   Specialty: Neurosurgery Contact information: [address] [place] [address] [phone]                 Signed: Clovis Riley 07/18/2022, 8:05 AM

## 2022-07-29 ENCOUNTER — Telehealth: Payer: Self-pay

## 2022-07-29 NOTE — Telephone Encounter (Signed)
Patient called stating that since the last time he was here he has been in for back surgeries and since then he has noticed that his Creatinine level is elevated and has not been elevated in the past. Patient is concerned and wanting to know If you could look at his most recent results and also take a look at his medications to see if you think any of those could be causing the elevation. Please advise.

## 2022-07-30 ENCOUNTER — Other Ambulatory Visit: Payer: Self-pay

## 2022-07-30 DIAGNOSIS — N289 Disorder of kidney and ureter, unspecified: Secondary | ICD-10-CM

## 2022-07-30 NOTE — Telephone Encounter (Signed)
Patient informed and scheduled and CMP future ordered to be released.

## 2022-08-09 DIAGNOSIS — N5201 Erectile dysfunction due to arterial insufficiency: Secondary | ICD-10-CM | POA: Diagnosis not present

## 2022-08-20 ENCOUNTER — Telehealth: Payer: Self-pay

## 2022-08-20 ENCOUNTER — Other Ambulatory Visit: Payer: Medicare Other

## 2022-08-20 ENCOUNTER — Other Ambulatory Visit: Payer: Self-pay

## 2022-08-20 DIAGNOSIS — N289 Disorder of kidney and ureter, unspecified: Secondary | ICD-10-CM

## 2022-08-20 DIAGNOSIS — R3912 Poor urinary stream: Secondary | ICD-10-CM | POA: Diagnosis not present

## 2022-08-20 DIAGNOSIS — N401 Enlarged prostate with lower urinary tract symptoms: Secondary | ICD-10-CM | POA: Diagnosis not present

## 2022-08-20 DIAGNOSIS — Z125 Encounter for screening for malignant neoplasm of prostate: Secondary | ICD-10-CM

## 2022-08-20 NOTE — Progress Notes (Signed)
Add on requested by Urology for PSA.

## 2022-08-20 NOTE — Telephone Encounter (Signed)
The patient called today wanting to know is a PSA can be added to his labs that was done today. I spoke with Leotis Shames, MA who has helped me with this add on. I gave the add on to [facility] and lauren put the order into the chart. Patient stated per Dr. Donnetta Hail with [facility] was wanting him to have this done.

## 2022-08-20 NOTE — Telephone Encounter (Signed)
This was approved by Dr. Sedalia Muta.

## 2022-08-21 LAB — COMPREHENSIVE METABOLIC PANEL
ALT: 19 IU/L (ref 0–44)
AST: 23 IU/L (ref 0–40)
Albumin: 4.3 g/dL (ref 3.8–4.8)
Alkaline Phosphatase: 64 IU/L (ref 44–121)
BUN/Creatinine Ratio: 14 (ref 10–24)
BUN: 17 mg/dL (ref 8–27)
Bilirubin Total: 0.5 mg/dL (ref 0.0–1.2)
CO2: 22 mmol/L (ref 20–29)
Calcium: 9.3 mg/dL (ref 8.6–10.2)
Chloride: 108 mmol/L — ABNORMAL HIGH (ref 96–106)
Creatinine, Ser: 1.2 mg/dL (ref 0.76–1.27)
Globulin, Total: 2.4 g/dL (ref 1.5–4.5)
Glucose: 101 mg/dL — ABNORMAL HIGH (ref 70–99)
Potassium: 4.8 mmol/L (ref 3.5–5.2)
Sodium: 144 mmol/L (ref 134–144)
Total Protein: 6.7 g/dL (ref 6.0–8.5)
eGFR: 63 mL/min/{1.73_m2} (ref >59)

## 2022-08-21 LAB — PSA: Prostate Specific Ag, Serum: 1.3 ng/mL (ref 0.0–4.0)

## 2022-09-09 ENCOUNTER — Ambulatory Visit: Payer: Medicare Other

## 2022-09-10 ENCOUNTER — Ambulatory Visit (INDEPENDENT_AMBULATORY_CARE_PROVIDER_SITE_OTHER): Payer: Medicare Other

## 2022-09-10 VITALS — BP 118/72 | HR 88 | Temp 98.8°F | Resp 14 | Ht 72.0 in | Wt 235.0 lb

## 2022-09-10 DIAGNOSIS — Z0000 Encounter for general adult medical examination without abnormal findings: Secondary | ICD-10-CM | POA: Diagnosis not present

## 2022-09-10 MED ORDER — DOCUSATE SODIUM 100 MG PO CAPS: 100.0000 mg | ORAL_CAPSULE | Freq: Every day | ORAL | 1 refills | Status: AC

## 2022-09-10 NOTE — Patient Instructions (Signed)
Health Maintenance, Male Adopting a healthy lifestyle and getting preventive care are important in promoting health and wellness. Ask your health care provider about: The right schedule for you to have regular tests and exams. Things you can do on your own to prevent diseases and keep yourself healthy. What should I know about diet, weight, and exercise? Eat a healthy diet  Eat a diet that includes plenty of vegetables, fruits, low-fat dairy products, and lean protein. Do not eat a lot of foods that are high in solid fats, added sugars, or sodium. Maintain a healthy weight Body mass index (BMI) is a measurement that can be used to identify possible weight problems. It estimates body fat based on height and weight. Your health care provider can help determine your BMI and help you achieve or maintain a healthy weight. Get regular exercise Get regular exercise. This is one of the most important things you can do for your health. Most adults should: Exercise for at least 150 minutes each week. The exercise should increase your heart rate and make you sweat (moderate-intensity exercise). Do strengthening exercises at least twice a week. This is in addition to the moderate-intensity exercise. Spend less time sitting. Even light physical activity can be beneficial. Watch cholesterol and blood lipids Have your blood tested for lipids and cholesterol at 76 years of age, then have this test every 5 years. You may need to have your cholesterol levels checked more often if: Your lipid or cholesterol levels are high. You are older than 76 years of age. You are at high risk for heart disease. What should I know about cancer screening? Many types of cancers can be detected early and may often be prevented. Depending on your health history and family history, you may need to have cancer screening at various ages. This may include screening for: Colorectal cancer. Prostate cancer. Skin cancer. Lung  cancer. What should I know about heart disease, diabetes, and high blood pressure? Blood pressure and heart disease High blood pressure causes heart disease and increases the risk of stroke. This is more likely to develop in people who have high blood pressure readings or are overweight. Talk with your health care provider about your target blood pressure readings. Have your blood pressure checked: Every 3-5 years if you are 18-39 years of age. Every year if you are 40 years old or older. If you are between the ages of 65 and 75 and are a current or former smoker, ask your health care provider if you should have a one-time screening for abdominal aortic aneurysm (AAA). Diabetes Have regular diabetes screenings. This checks your fasting blood sugar level. Have the screening done: Once every three years after age 45 if you are at a normal weight and have a low risk for diabetes. More often and at a younger age if you are overweight or have a high risk for diabetes. What should I know about preventing infection? Hepatitis B If you have a higher risk for hepatitis B, you should be screened for this virus. Talk with your health care provider to find out if you are at risk for hepatitis B infection. Hepatitis C Blood testing is recommended for: Everyone born from 1945 through 1965. Anyone with known risk factors for hepatitis C. Sexually transmitted infections (STIs) You should be screened each year for STIs, including gonorrhea and chlamydia, if: You are sexually active and are younger than 76 years of age. You are older than 76 years of age and your   health care provider tells you that you are at risk for this type of infection. Your sexual activity has changed since you were last screened, and you are at increased risk for chlamydia or gonorrhea. Ask your health care provider if you are at risk. Ask your health care provider about whether you are at high risk for HIV. Your health care provider  may recommend a prescription medicine to help prevent HIV infection. If you choose to take medicine to prevent HIV, you should first get tested for HIV. You should then be tested every 3 months for as long as you are taking the medicine. Follow these instructions at home: Alcohol use Do not drink alcohol if your health care provider tells you not to drink. If you drink alcohol: Limit how much you have to 0-2 drinks a day. Know how much alcohol is in your drink. In the [place] one drink equals one 12 oz bottle of beer (355 mL), one 5 oz glass of wine (148 mL), or one 1 oz glass of hard liquor (44 mL). Lifestyle Do not use any products that contain nicotine or tobacco. These products include cigarettes, chewing tobacco, and vaping devices, such as e-cigarettes. If you need help quitting, ask your health care provider. Do not use street drugs. Do not share needles. Ask your health care provider for help if you need support or information about quitting drugs. General instructions Schedule regular health, dental, and eye exams. Stay current with your vaccines. Tell your health care provider if: You often feel depressed. You have ever been abused or do not feel safe at home. Summary Adopting a healthy lifestyle and getting preventive care are important in promoting health and wellness. Follow your health care provider's instructions about healthy diet, exercising, and getting tested or screened for diseases. Follow your health care provider's instructions on monitoring your cholesterol and blood pressure. This information is not intended to replace advice given to you by your health care provider. Make sure you discuss any questions you have with your health care provider. Document Revised: 06/05/2020 Document Reviewed: 06/05/2020 Elsevier Patient Education  2024 Elsevier Inc.  
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
## 2022-09-11 ENCOUNTER — Ambulatory Visit (INDEPENDENT_AMBULATORY_CARE_PROVIDER_SITE_OTHER): Payer: Medicare Other

## 2022-09-11 DIAGNOSIS — I442 Atrioventricular block, complete: Secondary | ICD-10-CM | POA: Diagnosis not present

## 2022-09-13 DIAGNOSIS — M6281 Muscle weakness (generalized): Secondary | ICD-10-CM | POA: Diagnosis not present

## 2022-09-13 DIAGNOSIS — M545 Low back pain, unspecified: Secondary | ICD-10-CM | POA: Diagnosis not present

## 2022-09-17 ENCOUNTER — Telehealth: Payer: Self-pay

## 2022-09-17 DIAGNOSIS — M6281 Muscle weakness (generalized): Secondary | ICD-10-CM | POA: Diagnosis not present

## 2022-09-17 DIAGNOSIS — M545 Low back pain, unspecified: Secondary | ICD-10-CM | POA: Diagnosis not present
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
## 2022-09-18 DIAGNOSIS — S76312A Strain of muscle, fascia and tendon of the posterior muscle group at thigh level, left thigh, initial encounter: Secondary | ICD-10-CM | POA: Diagnosis not present

## 2022-09-18 DIAGNOSIS — R102 Pelvic and perineal pain: Secondary | ICD-10-CM | POA: Insufficient documentation

## 2022-09-18 HISTORY — DX: Pelvic and perineal pain: R10.2

## 2022-09-19 DIAGNOSIS — M545 Low back pain, unspecified: Secondary | ICD-10-CM | POA: Diagnosis not present

## 2022-09-19 DIAGNOSIS — M6281 Muscle weakness (generalized): Secondary | ICD-10-CM | POA: Diagnosis not present

## 2022-09-21 ENCOUNTER — Other Ambulatory Visit: Payer: Self-pay | Admitting: Family Medicine

## 2022-09-24 DIAGNOSIS — M6281 Muscle weakness (generalized): Secondary | ICD-10-CM | POA: Diagnosis not present

## 2022-09-24 DIAGNOSIS — M545 Low back pain, unspecified: Secondary | ICD-10-CM | POA: Diagnosis not present

## 2022-09-25 NOTE — Progress Notes (Signed)
Remote pacemaker transmission.   

## 2022-09-26 DIAGNOSIS — M545 Low back pain, unspecified: Secondary | ICD-10-CM | POA: Diagnosis not present

## 2022-09-26 DIAGNOSIS — M6281 Muscle weakness (generalized): Secondary | ICD-10-CM | POA: Diagnosis not present

## 2022-09-27 ENCOUNTER — Other Ambulatory Visit: Payer: Self-pay | Admitting: Cardiology

## 2022-09-27 NOTE — Telephone Encounter (Signed)
Rx refill sent to pharmacy. 

## 2022-09-30 ENCOUNTER — Other Ambulatory Visit: Payer: Self-pay | Admitting: Family Medicine

## 2022-10-01 DIAGNOSIS — M545 Low back pain, unspecified: Secondary | ICD-10-CM | POA: Diagnosis not present

## 2022-10-01 DIAGNOSIS — M6281 Muscle weakness (generalized): Secondary | ICD-10-CM | POA: Diagnosis not present

## 2022-10-03 DIAGNOSIS — M545 Low back pain, unspecified: Secondary | ICD-10-CM | POA: Diagnosis not present

## 2022-10-03 DIAGNOSIS — M6281 Muscle weakness (generalized): Secondary | ICD-10-CM | POA: Diagnosis not present

## 2022-10-06 ENCOUNTER — Other Ambulatory Visit: Payer: Self-pay | Admitting: Cardiology

## 2022-10-07 ENCOUNTER — Ambulatory Visit: Payer: Medicare Other | Attending: Cardiology | Admitting: Cardiology

## 2022-10-07 ENCOUNTER — Encounter: Payer: Self-pay | Admitting: Cardiology

## 2022-10-07 VITALS — BP 142/82 | HR 64 | Ht 72.0 in | Wt 238.0 lb

## 2022-10-07 DIAGNOSIS — R002 Palpitations: Secondary | ICD-10-CM | POA: Diagnosis not present

## 2022-10-07 DIAGNOSIS — I10 Essential (primary) hypertension: Secondary | ICD-10-CM | POA: Diagnosis not present

## 2022-10-07 DIAGNOSIS — I4891 Unspecified atrial fibrillation: Secondary | ICD-10-CM | POA: Diagnosis not present

## 2022-10-07 DIAGNOSIS — I442 Atrioventricular block, complete: Secondary | ICD-10-CM | POA: Diagnosis not present

## 2022-10-07 LAB — CUP PACEART INCLINIC DEVICE CHECK
Battery Remaining Longevity: 174 mo
Battery Voltage: 3.14 V
Brady Statistic AP VP Percent: 0 %
Brady Statistic AP VS Percent: 1.09 %
Brady Statistic AS VP Percent: 0 %
Brady Statistic AS VS Percent: 98.91 %
Brady Statistic RA Percent Paced: 1.17 %
Brady Statistic RV Percent Paced: 0 %
Date Time Interrogation Session: 20240909155305
Implantable Lead Connection Status: 753985
Implantable Lead Connection Status: 753985
Implantable Lead Implant Date: 20231114
Implantable Lead Implant Date: 20231114
Implantable Lead Location: 753859
Implantable Lead Location: 753860
Implantable Lead Model: 3830
Implantable Lead Model: 5076
Implantable Pulse Generator Implant Date: 20231114
Lead Channel Impedance Value: 323 Ohm
Lead Channel Impedance Value: 361 Ohm
Lead Channel Impedance Value: 361 Ohm
Lead Channel Impedance Value: 684 Ohm
Lead Channel Pacing Threshold Amplitude: 0.5 V
Lead Channel Pacing Threshold Amplitude: 1.125 V
Lead Channel Pacing Threshold Amplitude: 5 V
Lead Channel Pacing Threshold Pulse Width: 0.4 ms
Lead Channel Pacing Threshold Pulse Width: 0.4 ms
Lead Channel Pacing Threshold Pulse Width: 1 ms
Lead Channel Sensing Intrinsic Amplitude: 3.25 mV
Lead Channel Sensing Intrinsic Amplitude: 4 mV
Lead Channel Sensing Intrinsic Amplitude: 4 mV
Lead Channel Sensing Intrinsic Amplitude: 4.25 mV
Lead Channel Setting Pacing Amplitude: 2 V
Lead Channel Setting Pacing Amplitude: 6 V
Lead Channel Setting Pacing Pulse Width: 1 ms
Lead Channel Setting Sensing Sensitivity: 1.2 mV
Zone Setting Status: 755011

## 2022-10-07 NOTE — Progress Notes (Signed)
  Electrophysiology Office Note:   Date:  10/07/2022  ID:  Clarence Hill, DOB 02-Apr-1945, MRN [id number]  Primary Cardiologist: Gypsy Balsam, MD Electrophysiologist: Regan Lemming, MD      History of Present Illness:   Clarence Hill is a 77 y.o. male with h/o PVCs, hypertension, TIA, intermittent heart block seen today for routine electrophysiology followup.   Since last being seen in our clinic the patient reports doing well.  He has no chest pain or shortness of breath.  He is able to do all of his daily activities.  He recently had back surgery.  Since then, he has had intermittent episodes of lightheadedness.  Aside from that, he has no major complaints.  He feels that the lightheadedness may be related to some of the medications that he is taking from his back..  he denies chest pain, palpitations, dyspnea, PND, orthopnea, nausea, vomiting, dizziness, syncope, edema, weight gain, or early satiety.   Review of systems complete and found to be negative unless listed in HPI.      EP Information / Studies Reviewed:    EKG is ordered today. Personal review as below.  EKG Interpretation Date/Time:  Monday October 07 2022 14:40:51 EDT Ventricular Rate:  64 PR Interval:  200 QRS Duration:  86 QT Interval:  424 QTC Calculation: 437 R Axis:   -56  Text Interpretation: Sinus rhythm with Premature atrial complexes with Abberant conduction Left axis deviation Nonspecific ST abnormality When compared with ECG of 11-Dec-2021 17:55, No significant change since last tracing Confirmed by Evren Shankland ([id number]) on 10/07/2022 2:56:27 PM   PPM Interrogation-  reviewed in detail today,  See PACEART report.  Device History: Medtronic Dual Chamber PPM implanted 12/11/21 for intermittent CHB  Risk Assessment/Calculations:    Physical Exam:   VS:  BP (!) 142/82   Pulse 64   Ht 6' (1.829 m)   Wt 238 lb 0.6 oz (108 kg)   SpO2 92%   BMI 32.28 kg/m    Wt Readings from Last 3  Encounters:  10/07/22 238 lb 0.6 oz (108 kg)  09/10/22 235 lb (106.6 kg)  07/16/22 235 lb (106.6 kg)     GEN: Well nourished, well developed in no acute distress NECK: No JVD; No carotid bruits CARDIAC: Regular rate and rhythm, no murmurs, rubs, gallops RESPIRATORY:  Clear to auscultation without rales, wheezing or rhonchi  ABDOMEN: Soft, non-tender, non-distended EXTREMITIES:  No edema; No deformity   ASSESSMENT AND PLAN:    Intermittent CHB s/p Medtronic PPM  Normal PPM function See Pace Art report RV threshold has increased.  Chest x-ray without abnormality. No changes today  2.  Hypertension: Mildly elevated.  Usually well-controlled.  No changes.  3.  TIA: Monitoring for atrial fibrillation through pacemaker  Disposition:   Follow up with Dr. Elberta Fortis in 12 months  Signed, Ascencion Stegner Jorja Loa, MD

## 2022-10-07 NOTE — Patient Instructions (Signed)
Medication Instructions:  Your physician recommends that you continue on your current medications as directed. Please refer to the Current Medication list given to you today.  *If you need a refill on your cardiac medications before your next appointment, please call your pharmacy*   Lab Work: None ordered   Testing/Procedures: None ordered   Follow-Up: At [facility], you and your health needs are our priority.  As part of our continuing mission to provide you with exceptional heart care, we have created designated Provider Care Teams.  These Care Teams include your primary Cardiologist (physician) and Advanced Practice Providers (APPs -  Physician Assistants and Nurse Practitioners) who all work together to provide you with the care you need, when you need it.  Remote monitoring is used to monitor your Pacemaker or ICD from home. This monitoring reduces the number of office visits required to check your device to one time per year. It allows [name] to keep an eye on the functioning of your device to ensure it is working properly. You are scheduled for a device check from home on 12/11/2022. You may send your transmission at any time that day. If you have a wireless device, the transmission will be sent automatically. After your physician reviews your transmission, you will receive a postcard with your next transmission date.  Your next appointment:   1 year(s)  The format for your next appointment:   In Person  Provider:   Loman Brooklyn, MD{   Thank you for choosing [facility]!!   Dory Horn, RN [phone]

## 2022-10-08 DIAGNOSIS — M6281 Muscle weakness (generalized): Secondary | ICD-10-CM | POA: Diagnosis not present

## 2022-10-08 DIAGNOSIS — M545 Low back pain, unspecified: Secondary | ICD-10-CM | POA: Diagnosis not present

## 2022-10-10 DIAGNOSIS — M545 Low back pain, unspecified: Secondary | ICD-10-CM | POA: Diagnosis not present

## 2022-10-10 DIAGNOSIS — M6281 Muscle weakness (generalized): Secondary | ICD-10-CM | POA: Diagnosis not present

## 2022-10-15 DIAGNOSIS — M6281 Muscle weakness (generalized): Secondary | ICD-10-CM | POA: Diagnosis not present

## 2022-10-15 DIAGNOSIS — M545 Low back pain, unspecified: Secondary | ICD-10-CM | POA: Diagnosis not present

## 2022-10-16 DIAGNOSIS — R102 Pelvic and perineal pain: Secondary | ICD-10-CM | POA: Diagnosis not present

## 2022-10-16 DIAGNOSIS — S76312A Strain of muscle, fascia and tendon of the posterior muscle group at thigh level, left thigh, initial encounter: Secondary | ICD-10-CM | POA: Diagnosis not present

## 2022-10-17 DIAGNOSIS — M6281 Muscle weakness (generalized): Secondary | ICD-10-CM | POA: Diagnosis not present

## 2022-10-17 DIAGNOSIS — M545 Low back pain, unspecified: Secondary | ICD-10-CM | POA: Diagnosis not present

## 2022-10-24 ENCOUNTER — Other Ambulatory Visit (HOSPITAL_COMMUNITY): Payer: Self-pay | Admitting: Family Medicine

## 2022-10-24 DIAGNOSIS — M79605 Pain in left leg: Secondary | ICD-10-CM

## 2022-11-07 ENCOUNTER — Telehealth: Payer: Self-pay

## 2022-11-07 NOTE — Telephone Encounter (Signed)
Patient reports of a "heavy heart beat" Monday night while laying in bed. Denies any symptoms. Remote transmission received 11/05/22. Appears normal transmission. Presenting rhythm SR 70 bpm. PVC's noted on transmission, singles 38.7 per hour/ runs (2-4) 16.7 per hour. Patient advised to continue to monitor for symptoms and it they persist to call back and we can send to MD to advised further. Pt agreeable and voiced understanding.        Marland Kitchen

## 2022-11-07 NOTE — Telephone Encounter (Signed)
On 11/05/2022 the pt states he had some irregular heartbeats.

## 2022-11-13 DIAGNOSIS — M5416 Radiculopathy, lumbar region: Secondary | ICD-10-CM | POA: Diagnosis not present

## 2022-11-15 ENCOUNTER — Other Ambulatory Visit: Payer: Self-pay | Admitting: Family Medicine

## 2022-11-19 ENCOUNTER — Ambulatory Visit: Payer: Medicare Other | Admitting: Cardiology

## 2022-12-01 IMAGING — XA Imaging study
2 series · 2 of 2 positions shown · non-contrast
Comparison: none

CLINICAL DATA: Lumbosacral spondylosis without myelopathy. Low back
and left hip/groin pain. Disc extrusion at L2-3.

[Series 1: ortho standard · 1 of 1 slices shown (1 of 2)]
[im 1/1]
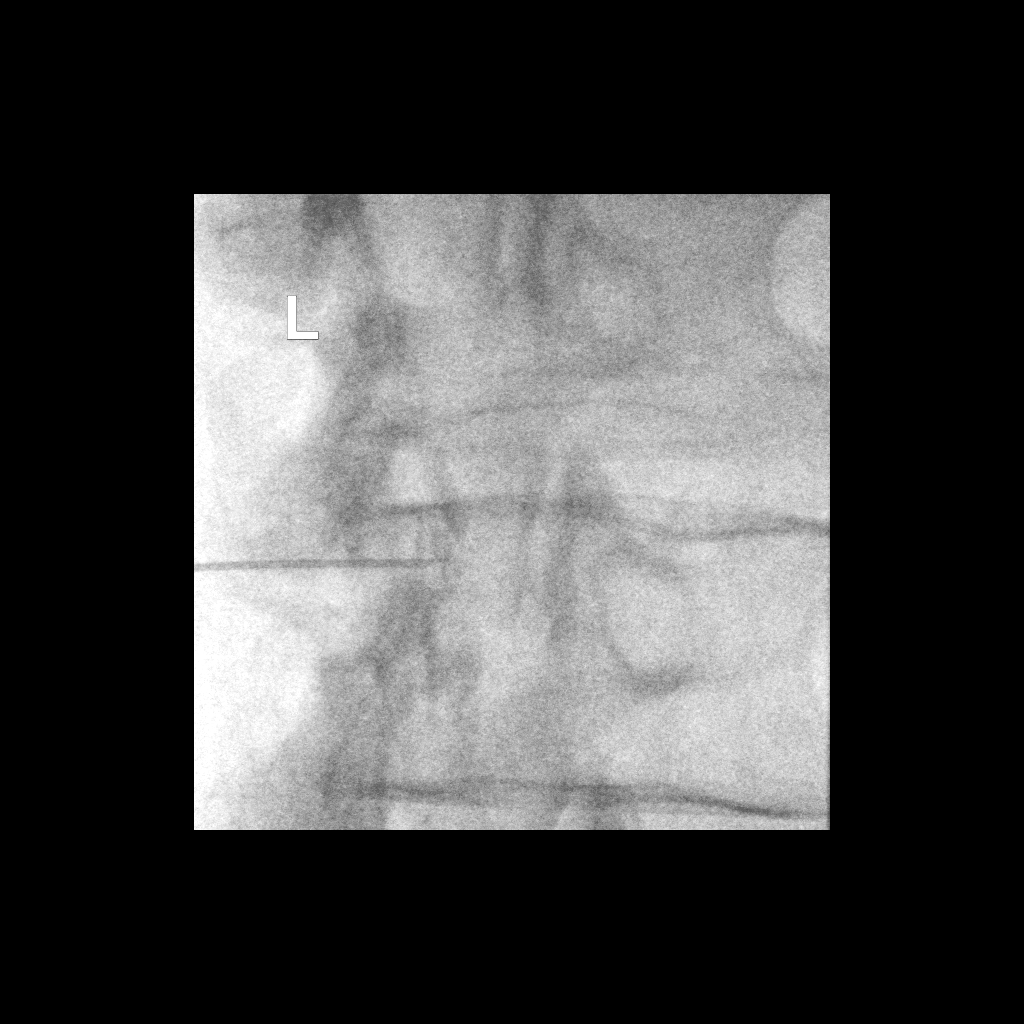

[Series 2: ortho standard · 1 of 1 slices shown (2 of 2)]
[im 1/1]
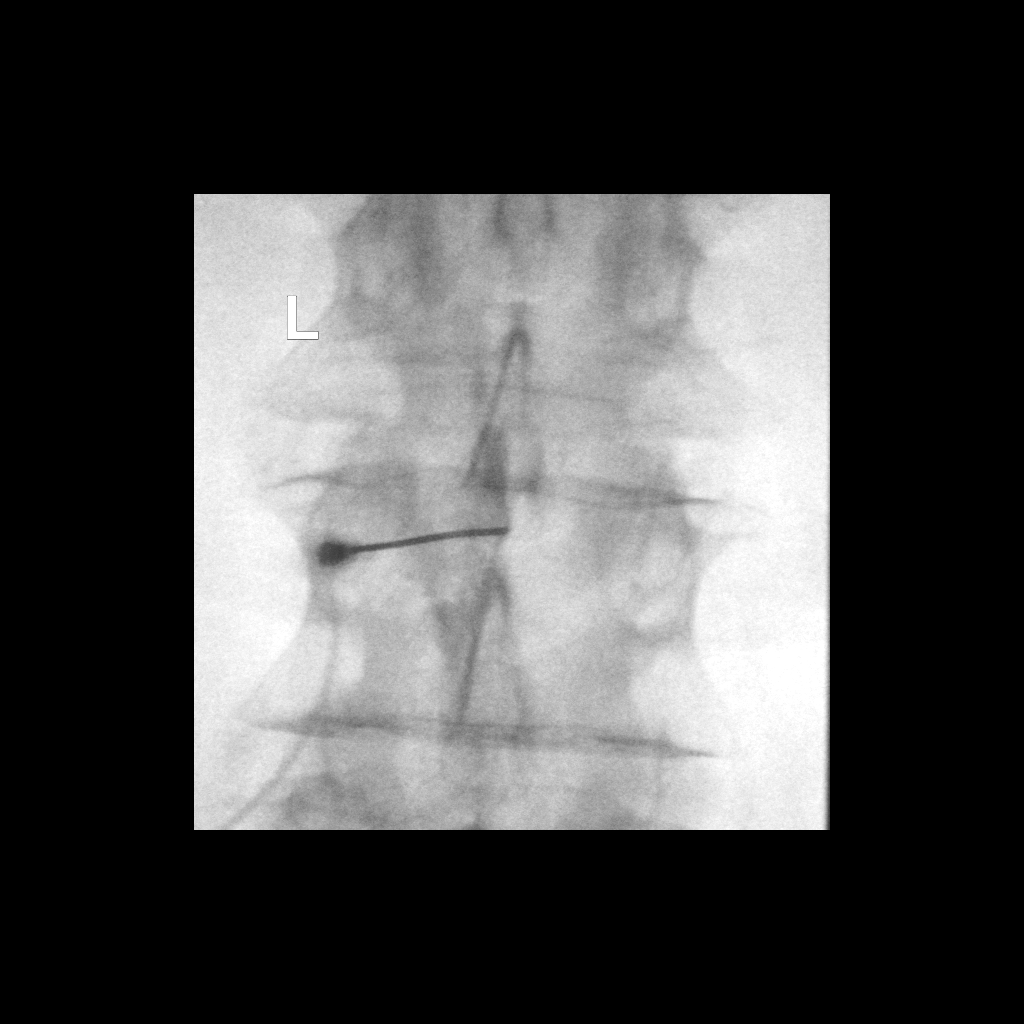

[2 of 2 positions shown; findings below may reference images not displayed]

FLUOROSCOPY TIME:  Fluoroscopy Time: 33 seconds

Radiation Exposure Index: 61.19 microGray*m^2

PROCEDURE:
The procedure, risks, benefits, and alternatives were explained to
the patient. Questions regarding the procedure were encouraged and
answered. The patient understands and consents to the procedure.

LUMBAR EPIDURAL INJECTION:

An interlaminar approach was performed on the left at L2-3. The
overlying skin was cleansed and anesthetized. A 3.5 inch 20 gauge
epidural needle was advanced using loss-of-resistance technique.

DIAGNOSTIC EPIDURAL INJECTION:

Injection of Isovue-M 200 shows a good epidural pattern with spread
above and below the level of needle placement, primarily on the
left. No vascular opacification is seen.

THERAPEUTIC EPIDURAL INJECTION:

80 mg of Depo-Medrol mixed with 3 mL of 1% lidocaine were instilled.
The procedure was well-tolerated, and the patient was discharged
thirty minutes following the injection in good condition.

COMPLICATIONS:
None
IMPRESSION: Technically successful interlaminar epidural injection on the left
at L2-3.

## 2022-12-06 ENCOUNTER — Ambulatory Visit (INDEPENDENT_AMBULATORY_CARE_PROVIDER_SITE_OTHER): Payer: Medicare Other

## 2022-12-06 DIAGNOSIS — Z23 Encounter for immunization: Secondary | ICD-10-CM | POA: Diagnosis not present

## 2022-12-06 NOTE — Progress Notes (Signed)
      Patient: Clarence Hill  DOB: 1945/06/16  MRN: [id number]    Visit Date: 12/06/2022    Clarence Hill presents today for his flu and pneumonia vaccines  Patient tolerated the injections well and has no questions.       Jacklynn Bue, LPN  95/62/13 08:65 AM

## 2022-12-11 ENCOUNTER — Ambulatory Visit: Payer: Medicare Other

## 2022-12-11 DIAGNOSIS — I442 Atrioventricular block, complete: Secondary | ICD-10-CM

## 2022-12-12 LAB — CUP PACEART REMOTE DEVICE CHECK
Battery Remaining Longevity: 172 mo
Battery Voltage: 3.11 V
Brady Statistic AP VP Percent: 0 %
Brady Statistic AP VS Percent: 3.61 %
Brady Statistic AS VP Percent: 0 %
Brady Statistic AS VS Percent: 96.38 %
Brady Statistic RA Percent Paced: 4.07 %
Brady Statistic RV Percent Paced: 0 %
Date Time Interrogation Session: 20241113211244
Implantable Lead Connection Status: 753985
Implantable Lead Connection Status: 753985
Implantable Lead Implant Date: 20231114
Implantable Lead Implant Date: 20231114
Implantable Lead Location: 753859
Implantable Lead Location: 753860
Implantable Lead Model: 3830
Implantable Lead Model: 5076
Implantable Pulse Generator Implant Date: 20231114
Lead Channel Impedance Value: 304 Ohm
Lead Channel Impedance Value: 323 Ohm
Lead Channel Impedance Value: 361 Ohm
Lead Channel Impedance Value: 589 Ohm
Lead Channel Pacing Threshold Amplitude: 0.5 V
Lead Channel Pacing Threshold Amplitude: 1.125 V
Lead Channel Pacing Threshold Pulse Width: 0.4 ms
Lead Channel Pacing Threshold Pulse Width: 0.4 ms
Lead Channel Sensing Intrinsic Amplitude: 3.125 mV
Lead Channel Sensing Intrinsic Amplitude: 3.125 mV
Lead Channel Sensing Intrinsic Amplitude: 4 mV
Lead Channel Sensing Intrinsic Amplitude: 4 mV
Lead Channel Setting Pacing Amplitude: 2 V
Lead Channel Setting Pacing Amplitude: 6 V
Lead Channel Setting Pacing Pulse Width: 1 ms
Lead Channel Setting Sensing Sensitivity: 1.2 mV
Zone Setting Status: 755011

## 2022-12-13 DIAGNOSIS — M47819 Spondylosis without myelopathy or radiculopathy, site unspecified: Secondary | ICD-10-CM | POA: Insufficient documentation

## 2022-12-13 HISTORY — DX: Spondylosis without myelopathy or radiculopathy, site unspecified: M47.819

## 2022-12-17 ENCOUNTER — Ambulatory Visit: Payer: Medicare Other | Attending: Cardiology | Admitting: Cardiology

## 2022-12-17 ENCOUNTER — Encounter: Payer: Self-pay | Admitting: Cardiology

## 2022-12-17 VITALS — BP 124/78 | HR 63 | Ht 71.0 in | Wt 240.2 lb

## 2022-12-17 DIAGNOSIS — I10 Essential (primary) hypertension: Secondary | ICD-10-CM | POA: Insufficient documentation

## 2022-12-17 DIAGNOSIS — R002 Palpitations: Secondary | ICD-10-CM | POA: Insufficient documentation

## 2022-12-17 DIAGNOSIS — R0609 Other forms of dyspnea: Secondary | ICD-10-CM | POA: Diagnosis not present

## 2022-12-17 DIAGNOSIS — R0789 Other chest pain: Secondary | ICD-10-CM | POA: Insufficient documentation
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
## 2022-12-17 NOTE — Addendum Note (Signed)
Addended by: Baldo Ash D on: 12/17/2022 03:57 PM   Modules accepted: Orders

## 2022-12-17 NOTE — Patient Instructions (Addendum)
Medication Instructions:  Your physician recommends that you continue on your current medications as directed. Please refer to the Current Medication list given to you today.  *If you need a refill on your cardiac medications before your next appointment, please call your pharmacy*   Lab Work: Your physician recommends that you return for lab work in: when fasting You need to have labs done when you are fasting.  You can come Monday through Friday 8:30 am to 12:00 pm and 1:15 to 4:30. You do not need to make an appointment as the order has already been placed. The labs you are going to have done are Lipids.    Testing/Procedures: Your physician has requested that you have an echocardiogram. Echocardiography is a painless test that uses sound waves to create images of your heart. It provides your doctor with information about the size and shape of your heart and how well your heart's chambers and valves are working. This procedure takes approximately one hour. There are no restrictions for this procedure. Please do NOT wear cologne, perfume, aftershave, or lotions (deodorant is allowed). Please arrive 15 minutes prior to your appointment time.  Please note: We ask at that you not bring children with you during ultrasound (echo/ vascular) testing. Due to room size and safety concerns, children are not allowed in the ultrasound rooms during exams. Our front office staff cannot provide observation of children in our lobby area while testing is being conducted. An adult accompanying a patient to their appointment will only be allowed in the ultrasound room at the discretion of the ultrasound technician under special circumstances. We apologize for any inconvenience.    Follow-Up: At [facility], you and your health needs are our priority.  As part of our continuing mission to provide you with exceptional heart care, we have created designated Provider Care Teams.  These Care Teams include your  primary Cardiologist (physician) and Advanced Practice Providers (APPs -  Physician Assistants and Nurse Practitioners) who all work together to provide you with the care you need, when you need it.  We recommend signing up for the patient portal called "MyChart".  Sign up information is provided on this After Visit Summary.  MyChart is used to connect with patients for Virtual Visits (Telemedicine).  Patients are able to view lab/test results, encounter notes, upcoming appointments, etc.  Non-urgent messages can be sent to your provider as well.   To learn more about what you can do with MyChart, go to [email or web address].    Your next appointment:   6 month(s)  The format for your next appointment:   In Person  Provider:   Gypsy Balsam, MD    Other Instructions NA

## 2022-12-18 ENCOUNTER — Other Ambulatory Visit: Payer: Self-pay | Admitting: Family Medicine

## 2022-12-18 ENCOUNTER — Encounter: Payer: Self-pay | Admitting: Family Medicine

## 2022-12-18 DIAGNOSIS — M1A9XX0 Chronic gout, unspecified, without tophus (tophi): Secondary | ICD-10-CM

## 2022-12-18 MED ORDER — COLCHICINE 0.6 MG PO TABS
ORAL_TABLET | ORAL | 0 refills | Status: DC
Start: 2022-12-18 — End: 2023-01-27

## 2022-12-18 NOTE — Telephone Encounter (Signed)
Copied from CRM [id number]. Topic: Clinical - Medication Refill >> Dec 18, 2022  9:57 AM Deaijah H wrote: Most Recent Primary Care Visit:  Provider: [facility]  Department: [facility]  Visit Type: FLU SHOT  Date: 12/06/2022  Medication: colchicine 0.6 MG tablet  Has the patient contacted their pharmacy? No (Agent: If no, request that the patient contact the pharmacy for the refill. If patient does not wish to contact the pharmacy document the reason why and proceed with request.) (Agent: If yes, when and what did the pharmacy advise?)  Is this the correct pharmacy for this prescription?  If no, delete pharmacy and type the correct one.  This is the patient's preferred pharmacy:  [facility] - [address] [place], [place] [address]   Has the prescription been filled recently? No  Is the patient out of the medication?  No  Has the patient been seen for an appointment in the last year OR does the patient have an upcoming appointment?   Can we respond through MyChart?   Agent: Please be advised that Rx refills may take up to 3 business days. We ask that you follow-up with your pharmacy.

## 2022-12-19 ENCOUNTER — Ambulatory Visit (INDEPENDENT_AMBULATORY_CARE_PROVIDER_SITE_OTHER): Payer: Medicare Other | Admitting: Physician Assistant

## 2022-12-19 ENCOUNTER — Encounter: Payer: Self-pay | Admitting: Physician Assistant

## 2022-12-19 VITALS — BP 124/72 | HR 64 | Temp 97.4°F | Ht 71.0 in | Wt 239.0 lb

## 2022-12-19 DIAGNOSIS — J060 Acute laryngopharyngitis: Secondary | ICD-10-CM | POA: Diagnosis not present

## 2022-12-19 DIAGNOSIS — R0789 Other chest pain: Secondary | ICD-10-CM | POA: Diagnosis not present

## 2022-12-19 MED ORDER — DOXYCYCLINE HYCLATE 100 MG PO TABS
100.0000 mg | ORAL_TABLET | Freq: Two times a day (BID) | ORAL | 0 refills | Status: AC
Start: 2022-12-19 — End: ?

## 2022-12-19 NOTE — Progress Notes (Signed)
Acute Office Visit  Subjective:    Patient ID: Clarence Hill, male    DOB: 21-Jun-1945, 77 y.o.   MRN: [id number]  Chief Complaint  Patient presents with   Sore Throat    With congestion    HPI: Patient is in today for complaints of malaise, cough, congestion and sore throat for the past 5-6 days.  Cough is productive in mornings and at night.  Has not had a fever.     Current Outpatient Medications:    allopurinol (ZYLOPRIM) 100 MG tablet, TAKE 1 TABLET BY MOUTH EVERY DAY, Disp: 90 tablet, Rfl: 0   amLODipine (NORVASC) 10 MG tablet, TAKE 1 TABLET BY MOUTH EVERY DAY, Disp: 90 tablet, Rfl: 1   aspirin EC 81 MG tablet, Take 1 tablet (81 mg total) by mouth daily., Disp: 30 tablet, Rfl: 11   colchicine 0.6 MG tablet, TAKE ONE TABLET BY MOUTH DAILY AS NEEDED DURING GOUT FLARE, Disp: 30 tablet, Rfl: 0   diphenhydramine-acetaminophen (TYLENOL PM) 25-500 MG TABS tablet, Take 2 tablets by mouth at bedtime as needed (sleep)., Disp: , Rfl:    doxycycline (VIBRA-TABS) 100 MG tablet, Take 1 tablet (100 mg total) by mouth 2 (two) times daily., Disp: 20 tablet, Rfl: 0   doxylamine, Sleep, (UNISOM) 25 MG tablet, Take 25 mg by mouth at bedtime., Disp: , Rfl:    fexofenadine (ALLEGRA HIVES 24HR) 180 MG tablet, Take 180 mg by mouth daily., Disp: , Rfl:    Multiple Vitamins-Minerals (MULTIVITAMIN WITH MINERALS) tablet, Take 1 tablet by mouth daily., Disp: , Rfl:    pantoprazole (PROTONIX) 40 MG tablet, Take 1 tablet (40 mg total) by mouth daily., Disp: 90 tablet, Rfl: 1   pravastatin (PRAVACHOL) 10 MG tablet, TAKE 1 TABLET BY MOUTH EVERYDAY AT BEDTIME (Patient taking differently: Take 10 mg by mouth at bedtime.), Disp: 90 tablet, Rfl: 1   vitamin B-12 (CYANOCOBALAMIN) 1000 MCG tablet, Take 1,000 mcg by mouth daily., Disp: , Rfl:    zolpidem (AMBIEN) 10 MG tablet, TAKE 1 TABLET BY MOUTH AT BEDTIME AS NEEDED FOR SLEEP. (Patient taking differently: Take 10 mg by mouth at bedtime as needed for sleep. for  sleep), Disp: 30 tablet, Rfl: 3  Allergies  Allergen Reactions   Prednisone Hives and Rash    Made him feel crazy   Levofloxacin Other (See Comments)    Unknown reaction   Penicillins Other (See Comments)    Unknown reaction Childhood   Pepcid [Famotidine] Cough   Penicillin G Benzathine & Proc Rash    ROS CONSTITUTIONAL: see HPI E/N/T: see HPI CARDIOVASCULAR: Negative for chest pain, dizziness, palpitations and pedal edema.  RESPIRATORY: see HPI GASTROINTESTINAL: Negative for abdominal pain, acid reflux symptoms, constipation, diarrhea, nausea and vomiting.  MSK: Negative for arthralgias and myalgias.       Objective:    PHYSICAL EXAM:   BP 124/72 (BP Location: Left Arm, Patient Position: Sitting)   Pulse 64   Temp (!) 97.4 F (36.3 C) (Temporal)   Ht 5\' 11"  (1.803 m)   Wt 239 lb (108.4 kg)   SpO2 98%   BMI 33.33 kg/m    GEN: Well nourished, well developed, in no acute distress  HEENT: normal external ears and nose - normal external auditory canals and TMS -  - Lips, Teeth and Gums - normal  Oropharynx -erythema/pnd Cardiac: RRR; no murmurs, rubs,  Respiratory:  normal respiratory rate and pattern with no distress - normal breath sounds with no rales, rhonchi, wheezes or  rubs      Assessment & Plan:    Acute laryngopharyngitis -     Doxycycline Hyclate; Take 1 tablet (100 mg total) by mouth 2 (two) times daily.  Dispense: 20 tablet; Refill: 0 Continue allegra Delsym for cough    Follow-up: Return if symptoms worsen or fail to improve.  An After Visit Summary was printed and given to the patient.  Jettie Pagan [facility] [phone]

## 2022-12-20 ENCOUNTER — Telehealth: Payer: Self-pay

## 2022-12-20 LAB — LIPID PANEL
Chol/HDL Ratio: 2.6 ratio (ref 0.0–5.0)
Cholesterol, Total: 116 mg/dL (ref 100–199)
HDL: 44 mg/dL (ref >39)
LDL Chol Calc (NIH): 51 mg/dL (ref 0–99)
Triglycerides: 113 mg/dL (ref 0–149)
VLDL Cholesterol Cal: 21 mg/dL (ref 5–40)

## 2022-12-20 NOTE — Telephone Encounter (Signed)
Patient notified through my chart.

## 2022-12-20 NOTE — Telephone Encounter (Signed)
-----   Message from Gypsy Balsam sent at 12/20/2022 11:24 AM EST ----- Cholesterol looks perfect

## 2022-12-23 DIAGNOSIS — L821 Other seborrheic keratosis: Secondary | ICD-10-CM | POA: Diagnosis not present

## 2022-12-23 DIAGNOSIS — D2239 Melanocytic nevi of other parts of face: Secondary | ICD-10-CM | POA: Diagnosis not present

## 2023-01-02 NOTE — Progress Notes (Signed)
Remote pacemaker transmission.   

## 2023-01-10 ENCOUNTER — Ambulatory Visit (HOSPITAL_COMMUNITY)
Admission: RE | Admit: 2023-01-10 | Discharge: 2023-01-10 | Disposition: A | Discharge status: Home / Self Care | Payer: Medicare Other | Source: Ambulatory Visit | Attending: Family Medicine

## 2023-01-10 DIAGNOSIS — M79605 Pain in left leg: Secondary | ICD-10-CM | POA: Diagnosis not present

## 2023-01-10 DIAGNOSIS — M160 Bilateral primary osteoarthritis of hip: Secondary | ICD-10-CM | POA: Diagnosis not present

## 2023-01-10 DIAGNOSIS — S76012A Strain of muscle, fascia and tendon of left hip, initial encounter: Secondary | ICD-10-CM | POA: Diagnosis not present

## 2023-01-10 DIAGNOSIS — M7602 Gluteal tendinitis, left hip: Secondary | ICD-10-CM | POA: Diagnosis not present

## 2023-01-10 DIAGNOSIS — M25552 Pain in left hip: Secondary | ICD-10-CM | POA: Diagnosis not present

## 2023-01-16 ENCOUNTER — Ambulatory Visit: Payer: Medicare Other | Attending: Cardiology

## 2023-01-16 DIAGNOSIS — R0609 Other forms of dyspnea: Secondary | ICD-10-CM | POA: Insufficient documentation

## 2023-01-16 LAB — ECHOCARDIOGRAM COMPLETE
Area-P 1/2: 3.08 cm2
MV M vel: 3.14 m/s
MV Peak grad: 39.4 mm[Hg]
P 1/2 time: 948 ms
S' Lateral: 3.2 cm

## 2023-01-24 ENCOUNTER — Telehealth: Payer: Self-pay

## 2023-01-24 NOTE — Telephone Encounter (Signed)
Patient notified of results and verbalized understanding.  

## 2023-01-24 NOTE — Telephone Encounter (Signed)
-----   Message from Gypsy Balsam sent at 01/17/2023 12:30 PM EST ----- Echocardiogram showed preserved left ventricle ejection fraction mild mitral valve regurgitation, aneurysm of the ascending aorta measuring 42 mm this is just from medical therapy

## 2023-01-27 ENCOUNTER — Telehealth: Payer: Self-pay | Admitting: Cardiology

## 2023-01-27 ENCOUNTER — Other Ambulatory Visit: Payer: Self-pay

## 2023-01-27 ENCOUNTER — Other Ambulatory Visit: Payer: Self-pay | Admitting: Emergency Medicine

## 2023-01-27 DIAGNOSIS — M1A9XX0 Chronic gout, unspecified, without tophus (tophi): Secondary | ICD-10-CM

## 2023-01-27 MED ORDER — METOPROLOL SUCCINATE ER 50 MG PO TB24: 50.0000 mg | ORAL_TABLET | Freq: Every day | ORAL | 3 refills | Status: DC

## 2023-01-27 MED ORDER — PANTOPRAZOLE SODIUM 40 MG PO TBEC: 40.0000 mg | DELAYED_RELEASE_TABLET | Freq: Every day | ORAL | 1 refills | Status: AC

## 2023-01-27 MED ORDER — PRAVASTATIN SODIUM 10 MG PO TABS: 10.0000 mg | ORAL_TABLET | Freq: Every day | ORAL | 1 refills | Status: AC

## 2023-01-27 MED ORDER — ZOLPIDEM TARTRATE 10 MG PO TABS: 10.0000 mg | ORAL_TABLET | Freq: Every evening | ORAL | 3 refills | Status: DC | PRN

## 2023-01-27 MED ORDER — AMLODIPINE BESYLATE 10 MG PO TABS: 10.0000 mg | ORAL_TABLET | Freq: Every day | ORAL | 1 refills | Status: DC

## 2023-01-27 MED ORDER — ALLOPURINOL 100 MG PO TABS: 100.0000 mg | ORAL_TABLET | Freq: Every day | ORAL | 0 refills | Status: DC

## 2023-01-27 MED ORDER — AMLODIPINE BESYLATE 10 MG PO TABS: 10.0000 mg | ORAL_TABLET | Freq: Every day | ORAL | 1 refills | Status: AC

## 2023-01-27 MED ORDER — PRAVASTATIN SODIUM 10 MG PO TABS: 10.0000 mg | ORAL_TABLET | Freq: Every day | ORAL | 1 refills | Status: DC

## 2023-01-27 MED ORDER — PANTOPRAZOLE SODIUM 40 MG PO TBEC: 40.0000 mg | DELAYED_RELEASE_TABLET | Freq: Every day | ORAL | 1 refills | Status: DC

## 2023-01-27 MED ORDER — COLCHICINE 0.6 MG PO TABS: ORAL_TABLET | ORAL | 0 refills | Status: AC

## 2023-01-27 NOTE — Telephone Encounter (Signed)
Pt would like a c/b regarding picking up physical copy of prescriptions to take to new pharmacy. Please advise

## 2023-01-27 NOTE — Telephone Encounter (Signed)
Spoke to patient on the phone. Notified patient that Metoprolol was taken off his medication list, and he stated that this was an error and that he has been taking Metoprolol 50 mg at bedtime. Dr. Bing Matter aware of patient being on this medication. Patient notified that prescription will be at the front desk available for him to pick up.

## 2023-01-27 NOTE — Telephone Encounter (Signed)
*  STAT* If patient is at the pharmacy, call can be transferred to refill team.   1. Which medications need to be refilled? (please list name of each medication and dose if known)   pantoprazole (PROTONIX) 40 MG tablet amLODipine (NORVASC) 10 MG tablet Metoprolol Succinate 50 MG tablet  2. Which pharmacy/location (including street and city if local pharmacy) is medication to be sent to?  Patient would like to pick up written prescriptions if possible. He states with his new medicare plan his current pharmacy is not recommended, but he won't know which pharmacy he will be referred to until January. Please advise.  3. Do they need a 30 day or 90 day supply?   90 day supply

## 2023-03-03 DIAGNOSIS — M25552 Pain in left hip: Secondary | ICD-10-CM | POA: Diagnosis not present

## 2023-03-03 DIAGNOSIS — M47819 Spondylosis without myelopathy or radiculopathy, site unspecified: Secondary | ICD-10-CM | POA: Diagnosis not present

## 2023-03-03 DIAGNOSIS — Z6833 Body mass index (BMI) 33.0-33.9, adult: Secondary | ICD-10-CM | POA: Diagnosis not present

## 2023-03-12 ENCOUNTER — Ambulatory Visit (INDEPENDENT_AMBULATORY_CARE_PROVIDER_SITE_OTHER): Payer: Medicare Other

## 2023-03-12 DIAGNOSIS — I442 Atrioventricular block, complete: Secondary | ICD-10-CM

## 2023-03-14 LAB — CUP PACEART REMOTE DEVICE CHECK
Battery Remaining Longevity: 169 mo
Battery Voltage: 3.08 V
Brady Statistic AP VP Percent: 0.01 %
Brady Statistic AP VS Percent: 2 %
Brady Statistic AS VP Percent: 0 %
Brady Statistic AS VS Percent: 98 %
Brady Statistic RA Percent Paced: 2.07 %
Brady Statistic RV Percent Paced: 0.01 %
Date Time Interrogation Session: 20250213173144
Implantable Lead Connection Status: 753985
Implantable Lead Connection Status: 753985
Implantable Lead Implant Date: 20231114
Implantable Lead Implant Date: 20231114
Implantable Lead Location: 753859
Implantable Lead Location: 753860
Implantable Lead Model: 3830
Implantable Lead Model: 5076
Implantable Pulse Generator Implant Date: 20231114
Lead Channel Impedance Value: 285 Ohm
Lead Channel Impedance Value: 323 Ohm
Lead Channel Impedance Value: 342 Ohm
Lead Channel Impedance Value: 513 Ohm
Lead Channel Pacing Threshold Amplitude: 0.5 V
Lead Channel Pacing Threshold Amplitude: 1.125 V
Lead Channel Pacing Threshold Pulse Width: 0.4 ms
Lead Channel Pacing Threshold Pulse Width: 0.4 ms
Lead Channel Sensing Intrinsic Amplitude: 3.5 mV
Lead Channel Sensing Intrinsic Amplitude: 3.5 mV
Lead Channel Sensing Intrinsic Amplitude: 4 mV
Lead Channel Sensing Intrinsic Amplitude: 4 mV
Lead Channel Setting Pacing Amplitude: 2 V
Lead Channel Setting Pacing Amplitude: 6 V
Lead Channel Setting Pacing Pulse Width: 1 ms
Lead Channel Setting Sensing Sensitivity: 1.2 mV
Zone Setting Status: 755011

## 2023-03-18 DIAGNOSIS — M25552 Pain in left hip: Secondary | ICD-10-CM | POA: Diagnosis not present

## 2023-03-27 ENCOUNTER — Telehealth: Payer: Self-pay

## 2023-03-27 NOTE — Telephone Encounter (Signed)
 Alert received from [employer] for 1 monitored VT event. Event occurred 2/26 @ 14:12, duration 14sec, mean HR 126.  V>A, >20 beats.  LVMTCB.

## 2023-03-28 ENCOUNTER — Other Ambulatory Visit: Payer: Self-pay | Admitting: Family Medicine

## 2023-03-28 NOTE — Telephone Encounter (Signed)
 Copied from CRM [id number]. Topic: Clinical - Medication Refill >> Mar 28, 2023  1:24 PM Fonda Kinder J wrote: Most Recent Primary Care Visit:  Provider: Marianne Sofia  Department: [facility]  Visit Type: OFFICE VISIT  Date: 12/19/2022  Medication: zolpidem (AMBIEN) 10 MG tablet  Has the patient contacted their pharmacy? Yes (Agent: If no, request that the patient contact the pharmacy for the refill. If patient does not wish to contact the pharmacy document the reason why and proceed with request.) (Agent: If yes, when and what did the pharmacy advise?) Pt has a new pharmacy  Is this the correct pharmacy for this prescription? No If no, delete pharmacy and type the correct one.  This is the patient's preferred pharmacy:    [facility] [address] - [place], [place] - [address] [place] [address] Phone: [phone] Fax: [phone]   Has the prescription been filled recently? No  Is the patient out of the medication? Yes  Has the patient been seen for an appointment in the last year OR does the patient have an upcoming appointment? Yes  Can we respond through MyChart? No  Agent: Please be advised that Rx refills may take up to 3 business days. We ask that you follow-up with your pharmacy.

## 2023-03-28 NOTE — Telephone Encounter (Signed)
 Patient denies any complaints, states he was working on his taxes during this time but felt fine. Reports compliance with Toprol 50 mg daily with no missed doses.   Routing to Dr. Elberta Fortis for recommendations.

## 2023-03-28 NOTE — Telephone Encounter (Signed)
 Last Fill: 01/27/23  Last OV: 12/19/22 Next OV: None Scheduled  Routing to provider for review/authorization.

## 2023-03-31 ENCOUNTER — Other Ambulatory Visit: Payer: Self-pay | Admitting: Family Medicine

## 2023-03-31 MED ORDER — ZOLPIDEM TARTRATE 10 MG PO TABS: 10.0000 mg | ORAL_TABLET | Freq: Every evening | ORAL | 3 refills | Status: DC | PRN

## 2023-03-31 NOTE — Telephone Encounter (Signed)
Attempted to contact patient. LMTCB

## 2023-04-01 MED ORDER — METOPROLOL SUCCINATE ER 100 MG PO TB24: 100.0000 mg | ORAL_TABLET | Freq: Every day | ORAL | 3 refills | Status: AC

## 2023-04-01 NOTE — Telephone Encounter (Signed)
 Outreach made to Pt.  Advised Pt per Dr. Kerrie Pleasure Toprol XL to 100 mg PO daily.  Pt agreeable.  Medication sent to [facility] as requested.  Will continue to monitor.

## 2023-04-16 NOTE — Progress Notes (Signed)
 Remote pacemaker transmission.

## 2023-04-16 NOTE — Addendum Note (Signed)
 Addended by: Elease Etienne A on: 04/16/2023 04:54 PM   Modules accepted: Orders

## 2023-04-21 DIAGNOSIS — M25552 Pain in left hip: Secondary | ICD-10-CM | POA: Diagnosis not present

## 2023-04-21 DIAGNOSIS — M6281 Muscle weakness (generalized): Secondary | ICD-10-CM | POA: Diagnosis not present

## 2023-04-21 DIAGNOSIS — R2689 Other abnormalities of gait and mobility: Secondary | ICD-10-CM | POA: Diagnosis not present

## 2023-04-24 DIAGNOSIS — R2689 Other abnormalities of gait and mobility: Secondary | ICD-10-CM | POA: Diagnosis not present

## 2023-04-24 DIAGNOSIS — M25552 Pain in left hip: Secondary | ICD-10-CM | POA: Diagnosis not present

## 2023-04-24 DIAGNOSIS — M6281 Muscle weakness (generalized): Secondary | ICD-10-CM | POA: Diagnosis not present

## 2023-04-28 DIAGNOSIS — R2689 Other abnormalities of gait and mobility: Secondary | ICD-10-CM | POA: Diagnosis not present

## 2023-04-28 DIAGNOSIS — M6281 Muscle weakness (generalized): Secondary | ICD-10-CM | POA: Diagnosis not present

## 2023-04-28 DIAGNOSIS — M25552 Pain in left hip: Secondary | ICD-10-CM | POA: Diagnosis not present

## 2023-05-05 DIAGNOSIS — R2689 Other abnormalities of gait and mobility: Secondary | ICD-10-CM | POA: Diagnosis not present

## 2023-05-05 DIAGNOSIS — M6281 Muscle weakness (generalized): Secondary | ICD-10-CM | POA: Diagnosis not present

## 2023-05-05 DIAGNOSIS — M25552 Pain in left hip: Secondary | ICD-10-CM | POA: Diagnosis not present

## 2023-05-08 DIAGNOSIS — M6281 Muscle weakness (generalized): Secondary | ICD-10-CM | POA: Diagnosis not present

## 2023-05-08 DIAGNOSIS — R2689 Other abnormalities of gait and mobility: Secondary | ICD-10-CM | POA: Diagnosis not present

## 2023-05-08 DIAGNOSIS — M25552 Pain in left hip: Secondary | ICD-10-CM | POA: Diagnosis not present

## 2023-05-13 DIAGNOSIS — M25552 Pain in left hip: Secondary | ICD-10-CM | POA: Diagnosis not present

## 2023-05-13 DIAGNOSIS — M6281 Muscle weakness (generalized): Secondary | ICD-10-CM | POA: Diagnosis not present

## 2023-05-13 DIAGNOSIS — R2689 Other abnormalities of gait and mobility: Secondary | ICD-10-CM | POA: Diagnosis not present

## 2023-05-19 DIAGNOSIS — R2689 Other abnormalities of gait and mobility: Secondary | ICD-10-CM | POA: Diagnosis not present

## 2023-05-19 DIAGNOSIS — M6281 Muscle weakness (generalized): Secondary | ICD-10-CM | POA: Diagnosis not present

## 2023-05-19 DIAGNOSIS — M25552 Pain in left hip: Secondary | ICD-10-CM | POA: Diagnosis not present

## 2023-05-22 DIAGNOSIS — K080 Exfoliation of teeth due to systemic causes: Secondary | ICD-10-CM | POA: Diagnosis not present

## 2023-05-23 DIAGNOSIS — M6281 Muscle weakness (generalized): Secondary | ICD-10-CM | POA: Diagnosis not present

## 2023-05-23 DIAGNOSIS — R2689 Other abnormalities of gait and mobility: Secondary | ICD-10-CM | POA: Diagnosis not present

## 2023-05-23 DIAGNOSIS — M25552 Pain in left hip: Secondary | ICD-10-CM | POA: Diagnosis not present

## 2023-05-26 DIAGNOSIS — K080 Exfoliation of teeth due to systemic causes: Secondary | ICD-10-CM | POA: Diagnosis not present

## 2023-05-29 DIAGNOSIS — M6281 Muscle weakness (generalized): Secondary | ICD-10-CM | POA: Diagnosis not present

## 2023-05-29 DIAGNOSIS — M25552 Pain in left hip: Secondary | ICD-10-CM | POA: Diagnosis not present

## 2023-05-29 DIAGNOSIS — R2689 Other abnormalities of gait and mobility: Secondary | ICD-10-CM | POA: Diagnosis not present

## 2023-06-05 DIAGNOSIS — M25552 Pain in left hip: Secondary | ICD-10-CM | POA: Diagnosis not present

## 2023-06-05 DIAGNOSIS — R2689 Other abnormalities of gait and mobility: Secondary | ICD-10-CM | POA: Diagnosis not present

## 2023-06-05 DIAGNOSIS — M6281 Muscle weakness (generalized): Secondary | ICD-10-CM | POA: Diagnosis not present

## 2023-06-09 ENCOUNTER — Telehealth: Payer: Self-pay | Admitting: Family Medicine

## 2023-06-09 ENCOUNTER — Other Ambulatory Visit: Payer: Self-pay | Admitting: Family Medicine

## 2023-06-09 MED ORDER — ALLOPURINOL 100 MG PO TABS: 100.0000 mg | ORAL_TABLET | Freq: Every day | ORAL | 1 refills | Status: DC

## 2023-06-09 NOTE — Telephone Encounter (Signed)
 Copied from CRM [id number]. Topic: Clinical - Medication Refill >> Jun 09, 2023  8:46 AM Everlene Hobby D wrote: Medication:  allopurinol  (ZYLOPRIM ) 100 MG tablet   Has the patient contacted their pharmacy? No (Agent: If no, request that the patient contact the pharmacy for the refill. If patient does not wish to contact the pharmacy document the reason why and proceed with request.) (Agent: If yes, when and what did the pharmacy advise?)  This is the patient's preferred pharmacy:   [facility] [address] - [place], [place] - [address] [place] [address] Phone: [phone] Fax: [phone]  Is this the correct pharmacy for this prescription? Yes If no, delete pharmacy and type the correct one.   Has the prescription been filled recently? No  Is the patient out of the medication? Yes  Has the patient been seen for an appointment in the last year OR does the patient have an upcoming appointment? Yes  Can we respond through MyChart? Yes  Agent: Please be advised that Rx refills may take up to 3 business days. We ask that you follow-up with your pharmacy.

## 2023-06-11 ENCOUNTER — Ambulatory Visit (INDEPENDENT_AMBULATORY_CARE_PROVIDER_SITE_OTHER): Payer: Medicare Other

## 2023-06-11 DIAGNOSIS — I442 Atrioventricular block, complete: Secondary | ICD-10-CM | POA: Diagnosis not present

## 2023-06-12 LAB — CUP PACEART REMOTE DEVICE CHECK
Battery Remaining Longevity: 166 mo
Battery Voltage: 3.05 V
Brady Statistic AP VP Percent: 0 %
Brady Statistic AP VS Percent: 2.73 %
Brady Statistic AS VP Percent: 0 %
Brady Statistic AS VS Percent: 97.27 %
Brady Statistic RA Percent Paced: 2.79 %
Brady Statistic RV Percent Paced: 0 %
Date Time Interrogation Session: 20250515100427
Implantable Lead Connection Status: 753985
Implantable Lead Connection Status: 753985
Implantable Lead Implant Date: 20231114
Implantable Lead Implant Date: 20231114
Implantable Lead Location: 753859
Implantable Lead Location: 753860
Implantable Lead Model: 3830
Implantable Lead Model: 5076
Implantable Pulse Generator Implant Date: 20231114
Lead Channel Impedance Value: 285 Ohm
Lead Channel Impedance Value: 323 Ohm
Lead Channel Impedance Value: 342 Ohm
Lead Channel Impedance Value: 627 Ohm
Lead Channel Pacing Threshold Amplitude: 0.5 V
Lead Channel Pacing Threshold Amplitude: 1.125 V
Lead Channel Pacing Threshold Pulse Width: 0.4 ms
Lead Channel Pacing Threshold Pulse Width: 0.4 ms
Lead Channel Sensing Intrinsic Amplitude: 3.125 mV
Lead Channel Sensing Intrinsic Amplitude: 3.125 mV
Lead Channel Sensing Intrinsic Amplitude: 3.875 mV
Lead Channel Sensing Intrinsic Amplitude: 3.875 mV
Lead Channel Setting Pacing Amplitude: 2 V
Lead Channel Setting Pacing Amplitude: 6 V
Lead Channel Setting Pacing Pulse Width: 1 ms
Lead Channel Setting Sensing Sensitivity: 1.2 mV
Zone Setting Status: 755011

## 2023-06-13 DIAGNOSIS — M5416 Radiculopathy, lumbar region: Secondary | ICD-10-CM | POA: Diagnosis not present

## 2023-06-13 DIAGNOSIS — Z6832 Body mass index (BMI) 32.0-32.9, adult: Secondary | ICD-10-CM | POA: Diagnosis not present

## 2023-06-15 ENCOUNTER — Ambulatory Visit: Payer: Self-pay | Admitting: Cardiology

## 2023-06-19 DIAGNOSIS — Z7902 Long term (current) use of antithrombotics/antiplatelets: Secondary | ICD-10-CM | POA: Diagnosis not present

## 2023-06-19 DIAGNOSIS — R0781 Pleurodynia: Secondary | ICD-10-CM | POA: Diagnosis not present

## 2023-06-19 DIAGNOSIS — M549 Dorsalgia, unspecified: Secondary | ICD-10-CM | POA: Diagnosis not present

## 2023-06-19 DIAGNOSIS — S20219A Contusion of unspecified front wall of thorax, initial encounter: Secondary | ICD-10-CM | POA: Diagnosis not present

## 2023-06-19 DIAGNOSIS — S0990XA Unspecified injury of head, initial encounter: Secondary | ICD-10-CM | POA: Diagnosis not present

## 2023-06-19 DIAGNOSIS — W19XXXA Unspecified fall, initial encounter: Secondary | ICD-10-CM | POA: Diagnosis not present

## 2023-06-19 DIAGNOSIS — S161XXA Strain of muscle, fascia and tendon at neck level, initial encounter: Secondary | ICD-10-CM | POA: Diagnosis not present

## 2023-06-19 DIAGNOSIS — N179 Acute kidney failure, unspecified: Secondary | ICD-10-CM | POA: Diagnosis not present

## 2023-06-19 DIAGNOSIS — K828 Other specified diseases of gallbladder: Secondary | ICD-10-CM | POA: Diagnosis not present

## 2023-06-25 ENCOUNTER — Other Ambulatory Visit (HOSPITAL_COMMUNITY): Payer: Self-pay | Admitting: Surgery

## 2023-06-25 DIAGNOSIS — M5416 Radiculopathy, lumbar region: Secondary | ICD-10-CM

## 2023-07-09 NOTE — CV Procedure (Signed)
  Device system confirmed to be MRI conditional, with implant date > 6 weeks ago, and no evidence of abandoned or epicardial leads in review of most recent CXR  Device last cleared by EP Provider: Valeri Gate 07/09/23  Clearance is good through for 1 year as long as parameters remain stable at time of check. If pt undergoes a cardiac device procedure during that time, they should be re-cleared.   Tachy-therapies to be programmed off if applicable with device back to pre-MRI settings after completion of exam.  [employer] - Programming recommendation received through [employer] App/Tablet  Arlys Lamer, RT  07/09/2023 8:47 AM

## 2023-07-14 ENCOUNTER — Ambulatory Visit (HOSPITAL_COMMUNITY)
Admission: RE | Admit: 2023-07-14 | Discharge: 2023-07-14 | Disposition: A | Discharge status: Home / Self Care | Source: Ambulatory Visit | Attending: Surgery | Admitting: Surgery

## 2023-07-14 DIAGNOSIS — M5416 Radiculopathy, lumbar region: Secondary | ICD-10-CM | POA: Diagnosis not present

## 2023-07-14 MED ORDER — GADOBUTROL 1 MMOL/ML IV SOLN
10.0000 mL | Freq: Once | INTRAVENOUS | Status: AC | PRN
  Administered 2023-07-14: 10 mL via INTRAVENOUS

## 2023-07-16 DIAGNOSIS — M25552 Pain in left hip: Secondary | ICD-10-CM | POA: Diagnosis not present

## 2023-07-16 DIAGNOSIS — R2689 Other abnormalities of gait and mobility: Secondary | ICD-10-CM | POA: Diagnosis not present

## 2023-07-16 DIAGNOSIS — M6281 Muscle weakness (generalized): Secondary | ICD-10-CM | POA: Diagnosis not present

## 2023-07-21 DIAGNOSIS — M25552 Pain in left hip: Secondary | ICD-10-CM | POA: Diagnosis not present

## 2023-07-21 DIAGNOSIS — R2689 Other abnormalities of gait and mobility: Secondary | ICD-10-CM | POA: Diagnosis not present

## 2023-07-21 DIAGNOSIS — M6281 Muscle weakness (generalized): Secondary | ICD-10-CM | POA: Diagnosis not present

## 2023-07-22 NOTE — Addendum Note (Signed)
 Addended by: VICCI SELLER A on: 07/22/2023 03:58 PM   Modules accepted: Orders

## 2023-07-22 NOTE — Progress Notes (Signed)
 Remote pacemaker transmission.

## 2023-07-28 DIAGNOSIS — M6281 Muscle weakness (generalized): Secondary | ICD-10-CM | POA: Diagnosis not present

## 2023-07-28 DIAGNOSIS — R2689 Other abnormalities of gait and mobility: Secondary | ICD-10-CM | POA: Diagnosis not present

## 2023-07-28 DIAGNOSIS — M25552 Pain in left hip: Secondary | ICD-10-CM | POA: Diagnosis not present

## 2023-08-04 ENCOUNTER — Telehealth: Payer: Self-pay | Admitting: Cardiology

## 2023-08-04 DIAGNOSIS — I10 Essential (primary) hypertension: Secondary | ICD-10-CM

## 2023-08-04 DIAGNOSIS — Z6832 Body mass index (BMI) 32.0-32.9, adult: Secondary | ICD-10-CM | POA: Diagnosis not present

## 2023-08-04 DIAGNOSIS — R3912 Poor urinary stream: Secondary | ICD-10-CM | POA: Diagnosis not present

## 2023-08-04 DIAGNOSIS — N401 Enlarged prostate with lower urinary tract symptoms: Secondary | ICD-10-CM | POA: Diagnosis not present

## 2023-08-04 DIAGNOSIS — M48062 Spinal stenosis, lumbar region with neurogenic claudication: Secondary | ICD-10-CM | POA: Diagnosis not present

## 2023-08-04 NOTE — Telephone Encounter (Signed)
 Pt called with message regarding BP elevated. Advised per Dr. Krasowski to come for [facility] and he may add BP meds according to the results. LVM for pt to call with any questions.

## 2023-08-04 NOTE — Telephone Encounter (Signed)
 Pt c/o BP issue: STAT if pt c/o blurred vision, one-sided weakness or slurred speech.  1. What is your BP concern?  BP has been elevated over the past few days.  2. Have you taken any BP medication today? Yes   3. What are your last 5 BP readings? Systolic has gotten up to 185, but this morning has been been 148, 155, 160, 157 over about 85.   4. Are you having any other symptoms (ex. Dizziness, headache, blurred vision, passed out)?  No

## 2023-08-04 NOTE — Addendum Note (Signed)
 Addended by: ARLOA PLANAS D on: 08/04/2023 02:58 PM   Modules accepted: Orders

## 2023-08-05 ENCOUNTER — Other Ambulatory Visit: Payer: Self-pay | Admitting: Family Medicine

## 2023-08-05 LAB — PSA: Prostate Specific Ag, Serum: 1.9 ng/mL (ref 0.0–4.0)

## 2023-08-05 LAB — BASIC METABOLIC PANEL WITH GFR
BUN/Creatinine Ratio: 12 (ref 10–24)
BUN: 18 mg/dL (ref 8–27)
CO2: 19 mmol/L — ABNORMAL LOW (ref 20–29)
Calcium: 9.2 mg/dL (ref 8.6–10.2)
Chloride: 105 mmol/L (ref 96–106)
Creatinine, Ser: 1.47 mg/dL — ABNORMAL HIGH (ref 0.76–1.27)
Glucose: 98 mg/dL (ref 70–99)
Potassium: 4.5 mmol/L (ref 3.5–5.2)
Sodium: 140 mmol/L (ref 134–144)
eGFR: 49 mL/min/1.73 — ABNORMAL LOW (ref >59)

## 2023-08-06 ENCOUNTER — Other Ambulatory Visit: Payer: Self-pay | Admitting: Neurosurgery

## 2023-08-06 DIAGNOSIS — R2689 Other abnormalities of gait and mobility: Secondary | ICD-10-CM | POA: Diagnosis not present

## 2023-08-06 DIAGNOSIS — M25552 Pain in left hip: Secondary | ICD-10-CM | POA: Diagnosis not present

## 2023-08-06 DIAGNOSIS — M6281 Muscle weakness (generalized): Secondary | ICD-10-CM | POA: Diagnosis not present

## 2023-08-14 ENCOUNTER — Other Ambulatory Visit (HOSPITAL_BASED_OUTPATIENT_CLINIC_OR_DEPARTMENT_OTHER): Payer: Self-pay

## 2023-08-28 DIAGNOSIS — M5416 Radiculopathy, lumbar region: Secondary | ICD-10-CM | POA: Diagnosis not present

## 2023-08-28 DIAGNOSIS — M48061 Spinal stenosis, lumbar region without neurogenic claudication: Secondary | ICD-10-CM | POA: Diagnosis not present

## 2023-09-07 ENCOUNTER — Encounter: Payer: Self-pay | Admitting: Cardiology

## 2023-09-08 ENCOUNTER — Ambulatory Visit: Attending: Cardiology

## 2023-09-08 DIAGNOSIS — I442 Atrioventricular block, complete: Secondary | ICD-10-CM

## 2023-09-08 NOTE — Telephone Encounter (Signed)
 Patient reports in the last couple of days the device has moved in the pocket.  Describes that it feels flatter and deeper with a bump on it now.  Also, is painful now when he sleeps on that side which is new for him.   Reports a fall 1-2 months ago when walking to the mail box.  Patient will send a remote transmission for us  to review and he is only 3 minutes from the [place] office and will see them in clinic this afternoon to assess.  Appt made with the device nurse who is there with Dr. Inocencio today to assess and provide reassurance.   Transmission sent, device nurse to review at appt this pm.

## 2023-09-08 NOTE — Progress Notes (Signed)
 Pt seen as add on to assess device site d/t complaint of pain.  Per Pt site is not painful as much as it is achy.  He is also concerned that device does not feel like it used to when he palpates the pacemaker.  States device  used to be more prominent.  Device palpated.  No complaints of discomfort with palpitation.  Reviewed last xray.  Advised Pt device appears to be in the same spot and orientation remains the same.  Pt states he has an upcoming surgery and thought he should get his device checked out before then.  Pt sent manual transmission and no abnormalities noted.  Device check not performed d/t manual transmission received.

## 2023-09-08 NOTE — Patient Instructions (Signed)
 Continue to monitor device site for any redness or unexplained fever or chills.  You will receive a call from Dr. Inocencio' scheduler to schedule your one year follow up.

## 2023-09-09 ENCOUNTER — Inpatient Hospital Stay (HOSPITAL_COMMUNITY): Admit: 2023-09-09

## 2023-09-09 DIAGNOSIS — M48061 Spinal stenosis, lumbar region without neurogenic claudication: Secondary | ICD-10-CM | POA: Diagnosis not present

## 2023-09-09 DIAGNOSIS — M5416 Radiculopathy, lumbar region: Secondary | ICD-10-CM | POA: Diagnosis not present

## 2023-09-09 SURGERY — ANTERIOR LATERAL LUMBAR FUSION 1 LEVEL
Anesthesia: General | Laterality: Left

## 2023-09-10 ENCOUNTER — Ambulatory Visit (INDEPENDENT_AMBULATORY_CARE_PROVIDER_SITE_OTHER): Payer: Self-pay

## 2023-09-10 DIAGNOSIS — I442 Atrioventricular block, complete: Secondary | ICD-10-CM | POA: Diagnosis not present

## 2023-09-11 DIAGNOSIS — K080 Exfoliation of teeth due to systemic causes: Secondary | ICD-10-CM | POA: Diagnosis not present

## 2023-09-11 LAB — CUP PACEART REMOTE DEVICE CHECK
Battery Remaining Longevity: 163 mo
Battery Voltage: 3.04 V
Brady Statistic AP VP Percent: 0 %
Brady Statistic AP VS Percent: 7.36 %
Brady Statistic AS VP Percent: 0 %
Brady Statistic AS VS Percent: 92.64 %
Brady Statistic RA Percent Paced: 7.39 %
Brady Statistic RV Percent Paced: 0 %
Date Time Interrogation Session: 20250811142315
Implantable Lead Connection Status: 753985
Implantable Lead Connection Status: 753985
Implantable Lead Implant Date: 20231114
Implantable Lead Implant Date: 20231114
Implantable Lead Location: 753859
Implantable Lead Location: 753860
Implantable Lead Model: 3830
Implantable Lead Model: 5076
Implantable Pulse Generator Implant Date: 20231114
Lead Channel Impedance Value: 304 Ohm
Lead Channel Impedance Value: 323 Ohm
Lead Channel Impedance Value: 361 Ohm
Lead Channel Impedance Value: 646 Ohm
Lead Channel Pacing Threshold Amplitude: 0.5 V
Lead Channel Pacing Threshold Amplitude: 1.125 V
Lead Channel Pacing Threshold Pulse Width: 0.4 ms
Lead Channel Pacing Threshold Pulse Width: 0.4 ms
Lead Channel Sensing Intrinsic Amplitude: 3.375 mV
Lead Channel Sensing Intrinsic Amplitude: 3.375 mV
Lead Channel Sensing Intrinsic Amplitude: 4.125 mV
Lead Channel Sensing Intrinsic Amplitude: 4.125 mV
Lead Channel Setting Pacing Amplitude: 2 V
Lead Channel Setting Pacing Amplitude: 6 V
Lead Channel Setting Pacing Pulse Width: 1 ms
Lead Channel Setting Sensing Sensitivity: 1.2 mV
Zone Setting Status: 755011

## 2023-09-13 ENCOUNTER — Ambulatory Visit: Payer: Self-pay | Admitting: Cardiology

## 2023-09-18 ENCOUNTER — Telehealth: Payer: Self-pay | Admitting: *Deleted

## 2023-09-18 NOTE — Telephone Encounter (Signed)
   Pre-operative Risk Assessment    Patient Name: Clarence Hill  DOB: November 26, 1945 MRN: [id number]      Request for Surgical Clearance    Procedure:  L3-5 Lumbar Laminectomy Revision  Date of Surgery:  Clearance 10/20/23                                 Surgeon:  Dr. Waddell Lager Surgeon's Group or Practice Name:  [facility] Phone number:  [phone] Fax number:  [phone]   Type of Clearance Requested:   - Medical  - Pharmacy:  Hold Aspirin      Type of Anesthesia:  Not Indicated   Additional requests/questions:    [name]   09/18/2023, 4:35 PM

## 2023-09-19 ENCOUNTER — Telehealth: Payer: Self-pay

## 2023-09-19 NOTE — Telephone Encounter (Signed)
 Preop tele appt now scheduled, med rec and consent done.

## 2023-09-19 NOTE — Telephone Encounter (Signed)
   Name: Clarence Hill  DOB: 05-16-1945  MRN: [id number]  Primary Cardiologist: Lamar Fitch, MD   Preoperative team, please contact this patient and set up a phone call appointment for further preoperative risk assessment. Please obtain consent and complete medication review. Thank you for your help. Last seen by Dr. Krasowski on 12/17/2022. Surgery is scheduled for 10/20/2023.   I confirm that guidance regarding antiplatelet and oral anticoagulation therapy has been completed and, if necessary, noted below.  Per office protocol, if patient is without any new symptoms or concerns at the time of their virtual visit, he/she may hold ASA for 7 days prior to procedure. Please resume ASA as soon as possible postprocedure, at the discretion of the surgeon.    I also confirmed the patient resides in the state of [place] . As per [facility] Medical Board telemedicine laws, the patient must reside in the state in which the provider is licensed.   Lamarr Satterfield, NP 09/19/2023, 8:29 AM [facility]

## 2023-09-19 NOTE — Telephone Encounter (Signed)
  Patient Consent for Virtual Visit        Clarence Hill has provided verbal consent on 09/19/2023 for a virtual visit (video or telephone).   CONSENT FOR VIRTUAL VISIT FOR:  Clarence Hill  By participating in this virtual visit I agree to the following:  I hereby voluntarily request, consent and authorize [facility] and its employed or contracted physicians, physician assistants, nurse practitioners or other licensed health care professionals (the Practitioner), to provide me with telemedicine health care services (the "Services) as deemed necessary by the treating Practitioner. I acknowledge and consent to receive the Services by the Practitioner via telemedicine. I understand that the telemedicine visit will involve communicating with the Practitioner through live audiovisual communication technology and the disclosure of certain medical information by electronic transmission. I acknowledge that I have been given the opportunity to request an in-person assessment or other available alternative prior to the telemedicine visit and am voluntarily participating in the telemedicine visit.  I understand that I have the right to withhold or withdraw my consent to the use of telemedicine in the course of my care at any time, without affecting my right to future care or treatment, and that the Practitioner or I may terminate the telemedicine visit at any time. I understand that I have the right to inspect all information obtained and/or recorded in the course of the telemedicine visit and may receive copies of available information for a reasonable fee.  I understand that some of the potential risks of receiving the Services via telemedicine include:  Delay or interruption in medical evaluation due to technological equipment failure or disruption; Information transmitted may not be sufficient (e.g. poor resolution of images) to allow for appropriate medical decision making by the Practitioner;  and/or  In rare instances, security protocols could fail, causing a breach of personal health information.  Furthermore, I acknowledge that it is my responsibility to provide information about my medical history, conditions and care that is complete and accurate to the best of my ability. I acknowledge that Practitioner's advice, recommendations, and/or decision may be based on factors not within their control, such as incomplete or inaccurate data provided by me or distortions of diagnostic images or specimens that may result from electronic transmissions. I understand that the practice of medicine is not an exact science and that Practitioner makes no warranties or guarantees regarding treatment outcomes. I acknowledge that a copy of this consent can be made available to me via my patient portal [facility] MyChart), or I can request a printed copy by calling the office of [facility].    I understand that my insurance will be billed for this visit.   I have read or had this consent read to me. I understand the contents of this consent, which adequately explains the benefits and risks of the Services being provided via telemedicine.  I have been provided ample opportunity to ask questions regarding this consent and the Services and have had my questions answered to my satisfaction. I give my informed consent for the services to be provided through the use of telemedicine in my medical care

## 2023-09-25 ENCOUNTER — Ambulatory Visit

## 2023-09-25 VITALS — Ht 71.0 in | Wt 239.0 lb

## 2023-09-25 DIAGNOSIS — Z0000 Encounter for general adult medical examination without abnormal findings: Secondary | ICD-10-CM
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
## 2023-09-25 NOTE — Patient Instructions (Signed)
 Mr. Clarence Hill , Thank you for taking time out of your busy schedule to complete your Annual Wellness Visit with me. I enjoyed our conversation and look forward to speaking with you again next year. I, as well as your care team,  appreciate your ongoing commitment to your health goals. Please review the following plan we discussed and let me know if I can assist you in the future. Your Game plan/ To Do List     Follow up Visits: We will see or speak with you next year for your Next Medicare AWV with our clinical staff Have you seen your provider in the last 6 months (3 months if uncontrolled diabetes)? No  Clinician Recommendations:  Aim for 30 minutes of exercise or brisk walking, 6-8 glasses of water, and 5 servings of fruits and vegetables each day.       This is a list of the screenings recommended for you:  Health Maintenance  Topic Date Due   Zoster (Shingles) Vaccine (1 of 2) 01/19/1966   Flu Shot  08/29/2023   Medicare Annual Wellness Visit  09/24/2024   DTaP/Tdap/Td vaccine (2 - Td or Tdap) 08/18/2029   Pneumococcal Vaccine for age over 33  Completed   HPV Vaccine  Aged Out   Meningitis B Vaccine  Aged Out   Colon Cancer Screening  Discontinued   COVID-19 Vaccine  Discontinued   Hepatitis C Screening  Discontinued    Advanced directives: (ACP Link)Information on Advanced Care Planning can be found at [place]  Secretary of [facility] Advance Health Care Directives Advance Health Care Directives. [email or web address]  Advance Care Planning is important because it:  [x]  Makes sure you receive the medical care that is consistent with your values, goals, and preferences  [x]  It provides guidance to your family and loved ones and reduces their decisional burden about whether or not they are making the right decisions based on your wishes.  Follow the link provided in your after visit summary or read over the paperwork we have mailed to you to help you started getting your Advance Directives  in place. If you need assistance in completing these, please reach out to us  so that we can help you!  See attachments for Preventive Care and Fall Prevention Tips.

## 2023-09-29 DIAGNOSIS — M1990 Unspecified osteoarthritis, unspecified site: Secondary | ICD-10-CM | POA: Diagnosis not present

## 2023-09-29 DIAGNOSIS — R9431 Abnormal electrocardiogram [ECG] [EKG]: Secondary | ICD-10-CM | POA: Diagnosis not present

## 2023-09-29 DIAGNOSIS — I10 Essential (primary) hypertension: Secondary | ICD-10-CM | POA: Diagnosis not present

## 2023-09-29 DIAGNOSIS — Z6833 Body mass index (BMI) 33.0-33.9, adult: Secondary | ICD-10-CM | POA: Diagnosis not present

## 2023-09-29 DIAGNOSIS — M109 Gout, unspecified: Secondary | ICD-10-CM | POA: Diagnosis not present

## 2023-09-29 DIAGNOSIS — Z7982 Long term (current) use of aspirin: Secondary | ICD-10-CM | POA: Diagnosis not present

## 2023-09-29 DIAGNOSIS — R079 Chest pain, unspecified: Secondary | ICD-10-CM | POA: Diagnosis not present

## 2023-09-29 DIAGNOSIS — Z87891 Personal history of nicotine dependence: Secondary | ICD-10-CM | POA: Diagnosis not present

## 2023-09-29 DIAGNOSIS — I444 Left anterior fascicular block: Secondary | ICD-10-CM | POA: Diagnosis not present

## 2023-09-29 DIAGNOSIS — Z950 Presence of cardiac pacemaker: Secondary | ICD-10-CM | POA: Diagnosis not present

## 2023-09-29 DIAGNOSIS — G8929 Other chronic pain: Secondary | ICD-10-CM | POA: Diagnosis not present

## 2023-09-29 DIAGNOSIS — Z888 Allergy status to other drugs, medicaments and biological substances status: Secondary | ICD-10-CM | POA: Diagnosis not present

## 2023-09-29 DIAGNOSIS — E785 Hyperlipidemia, unspecified: Secondary | ICD-10-CM | POA: Diagnosis not present

## 2023-09-29 DIAGNOSIS — M545 Low back pain, unspecified: Secondary | ICD-10-CM | POA: Diagnosis not present

## 2023-09-29 DIAGNOSIS — I200 Unstable angina: Secondary | ICD-10-CM | POA: Diagnosis not present

## 2023-09-29 DIAGNOSIS — I351 Nonrheumatic aortic (valve) insufficiency: Secondary | ICD-10-CM | POA: Diagnosis not present

## 2023-09-29 DIAGNOSIS — Z87442 Personal history of urinary calculi: Secondary | ICD-10-CM | POA: Diagnosis not present

## 2023-09-29 DIAGNOSIS — I517 Cardiomegaly: Secondary | ICD-10-CM | POA: Diagnosis not present

## 2023-09-29 DIAGNOSIS — Z880 Allergy status to penicillin: Secondary | ICD-10-CM | POA: Diagnosis not present

## 2023-09-29 DIAGNOSIS — Z79899 Other long term (current) drug therapy: Secondary | ICD-10-CM | POA: Diagnosis not present

## 2023-09-29 DIAGNOSIS — E669 Obesity, unspecified: Secondary | ICD-10-CM | POA: Diagnosis not present

## 2023-09-29 DIAGNOSIS — I440 Atrioventricular block, first degree: Secondary | ICD-10-CM | POA: Diagnosis not present

## 2023-09-30 DIAGNOSIS — I444 Left anterior fascicular block: Secondary | ICD-10-CM | POA: Diagnosis not present

## 2023-09-30 DIAGNOSIS — I200 Unstable angina: Secondary | ICD-10-CM | POA: Diagnosis not present

## 2023-09-30 DIAGNOSIS — Z950 Presence of cardiac pacemaker: Secondary | ICD-10-CM | POA: Diagnosis not present

## 2023-09-30 DIAGNOSIS — R079 Chest pain, unspecified: Secondary | ICD-10-CM | POA: Diagnosis not present

## 2023-09-30 DIAGNOSIS — I517 Cardiomegaly: Secondary | ICD-10-CM | POA: Diagnosis not present

## 2023-09-30 DIAGNOSIS — I351 Nonrheumatic aortic (valve) insufficiency: Secondary | ICD-10-CM | POA: Diagnosis not present

## 2023-09-30 DIAGNOSIS — E785 Hyperlipidemia, unspecified: Secondary | ICD-10-CM | POA: Diagnosis not present

## 2023-09-30 DIAGNOSIS — I10 Essential (primary) hypertension: Secondary | ICD-10-CM | POA: Diagnosis not present

## 2023-10-01 ENCOUNTER — Encounter (HOSPITAL_COMMUNITY)
Admission: RE | Disposition: A | Discharge status: Home / Self Care | Payer: Self-pay | Source: Home / Self Care | Attending: Cardiology

## 2023-10-01 ENCOUNTER — Observation Stay (HOSPITAL_COMMUNITY)
Admission: RE | Admit: 2023-10-01 | Discharge: 2023-10-01 | Disposition: A | Discharge status: Home / Self Care | Attending: Cardiology | Admitting: Cardiology

## 2023-10-01 ENCOUNTER — Encounter: Payer: Self-pay | Admitting: Cardiology

## 2023-10-01 DIAGNOSIS — I10 Essential (primary) hypertension: Secondary | ICD-10-CM | POA: Diagnosis not present

## 2023-10-01 DIAGNOSIS — I440 Atrioventricular block, first degree: Secondary | ICD-10-CM | POA: Diagnosis not present

## 2023-10-01 DIAGNOSIS — Z7982 Long term (current) use of aspirin: Secondary | ICD-10-CM | POA: Insufficient documentation

## 2023-10-01 DIAGNOSIS — Z79899 Other long term (current) drug therapy: Secondary | ICD-10-CM | POA: Insufficient documentation

## 2023-10-01 DIAGNOSIS — Z87891 Personal history of nicotine dependence: Secondary | ICD-10-CM | POA: Diagnosis not present

## 2023-10-01 DIAGNOSIS — Z8673 Personal history of transient ischemic attack (TIA), and cerebral infarction without residual deficits: Secondary | ICD-10-CM | POA: Diagnosis not present

## 2023-10-01 DIAGNOSIS — R079 Chest pain, unspecified: Principal | ICD-10-CM | POA: Insufficient documentation

## 2023-10-01 DIAGNOSIS — E785 Hyperlipidemia, unspecified: Secondary | ICD-10-CM | POA: Insufficient documentation

## 2023-10-01 DIAGNOSIS — I200 Unstable angina: Secondary | ICD-10-CM | POA: Diagnosis not present

## 2023-10-01 DIAGNOSIS — R0789 Other chest pain: Secondary | ICD-10-CM | POA: Diagnosis present

## 2023-10-01 DIAGNOSIS — Z950 Presence of cardiac pacemaker: Secondary | ICD-10-CM | POA: Diagnosis not present

## 2023-10-01 HISTORY — PX: LEFT HEART CATH AND CORONARY ANGIOGRAPHY: CATH118249

## 2023-10-01 SURGERY — LEFT HEART CATH AND CORONARY ANGIOGRAPHY
Anesthesia: LOCAL

## 2023-10-01 MED ORDER — AMLODIPINE BESYLATE 5 MG PO TABS: 10.0000 mg | ORAL_TABLET | Freq: Every day | ORAL | Status: DC

## 2023-10-01 MED ORDER — PRAVASTATIN SODIUM 10 MG PO TABS: 10.0000 mg | ORAL_TABLET | Freq: Every day | ORAL | Status: DC

## 2023-10-01 MED ORDER — MIDAZOLAM HCL 2 MG/2ML IJ SOLN
INTRAMUSCULAR | Status: DC | PRN
  Administered 2023-10-01: 2 mg via INTRAVENOUS

## 2023-10-01 MED ORDER — ACETAMINOPHEN 325 MG PO TABS: 650.0000 mg | ORAL_TABLET | ORAL | Status: DC | PRN

## 2023-10-01 MED ORDER — IOHEXOL 350 MG/ML SOLN
INTRAVENOUS | Status: DC | PRN
  Administered 2023-10-01: 40 mL

## 2023-10-01 MED ORDER — ONDANSETRON HCL 4 MG/2ML IJ SOLN: 4.0000 mg | Freq: Four times a day (QID) | INTRAMUSCULAR | Status: DC | PRN

## 2023-10-01 MED ORDER — FREE WATER
500.0000 mL | Freq: Once | Status: AC
  Administered 2023-10-01: 500 mL via ORAL

## 2023-10-01 MED ORDER — ASPIRIN 81 MG PO TBEC: 81.0000 mg | DELAYED_RELEASE_TABLET | Freq: Every day | ORAL | Status: DC

## 2023-10-01 MED ORDER — MIDAZOLAM HCL 2 MG/2ML IJ SOLN
INTRAMUSCULAR | Status: AC
  Filled 2023-10-01: qty 2

## 2023-10-01 MED ORDER — ADULT MULTIVITAMIN W/MINERALS CH: 1.0000 | ORAL_TABLET | Freq: Every day | ORAL | Status: DC

## 2023-10-01 MED ORDER — PANTOPRAZOLE SODIUM 40 MG PO TBEC: 40.0000 mg | DELAYED_RELEASE_TABLET | Freq: Every day | ORAL | Status: DC

## 2023-10-01 MED ORDER — FENTANYL CITRATE (PF) 100 MCG/2ML IJ SOLN
INTRAMUSCULAR | Status: DC | PRN
  Administered 2023-10-01: 25 ug via INTRAVENOUS

## 2023-10-01 MED ORDER — SODIUM CHLORIDE 0.9% FLUSH: 3.0000 mL | Freq: Two times a day (BID) | INTRAVENOUS | Status: DC

## 2023-10-01 MED ORDER — ALLOPURINOL 100 MG PO TABS: 100.0000 mg | ORAL_TABLET | Freq: Every day | ORAL | Status: DC

## 2023-10-01 MED ORDER — LIDOCAINE HCL (PF) 1 % IJ SOLN
INTRAMUSCULAR | Status: DC | PRN
  Administered 2023-10-01: 2 mL

## 2023-10-01 MED ORDER — HEPARIN SODIUM (PORCINE) 1000 UNIT/ML IJ SOLN
INTRAMUSCULAR | Status: AC
  Filled 2023-10-01: qty 10

## 2023-10-01 MED ORDER — VERAPAMIL HCL 2.5 MG/ML IV SOLN
INTRAVENOUS | Status: DC | PRN
  Administered 2023-10-01: 10 mL via INTRA_ARTERIAL

## 2023-10-01 MED ORDER — SODIUM CHLORIDE 0.9% FLUSH: 3.0000 mL | INTRAVENOUS | Status: DC | PRN

## 2023-10-01 MED ORDER — ASPIRIN 81 MG PO CHEW
81.0000 mg | CHEWABLE_TABLET | ORAL | Status: AC
  Administered 2023-10-01: 81 mg via ORAL

## 2023-10-01 MED ORDER — HEPARIN SODIUM (PORCINE) 1000 UNIT/ML IJ SOLN
INTRAMUSCULAR | Status: DC | PRN
  Administered 2023-10-01: 5000 [IU] via INTRAVENOUS

## 2023-10-01 MED ORDER — VERAPAMIL HCL 2.5 MG/ML IV SOLN
INTRAVENOUS | Status: AC
  Filled 2023-10-01: qty 2

## 2023-10-01 MED ORDER — FENTANYL CITRATE (PF) 100 MCG/2ML IJ SOLN
INTRAMUSCULAR | Status: AC
  Filled 2023-10-01: qty 2

## 2023-10-01 MED ORDER — HEPARIN (PORCINE) IN NACL 1000-0.9 UT/500ML-% IV SOLN
INTRAVENOUS | Status: DC | PRN
Start: 2023-10-01 — End: 2023-10-01
  Administered 2023-10-01 (×2): 500 mL

## 2023-10-01 MED ORDER — ASPIRIN 81 MG PO CHEW
CHEWABLE_TABLET | ORAL | Status: AC
  Filled 2023-10-01: qty 1

## 2023-10-01 MED ORDER — SODIUM CHLORIDE 0.9 % IV SOLN: 250.0000 mL | INTRAVENOUS | Status: DC | PRN

## 2023-10-01 MED ORDER — LIDOCAINE HCL (PF) 1 % IJ SOLN
INTRAMUSCULAR | Status: AC
  Filled 2023-10-01: qty 30

## 2023-10-01 SURGICAL SUPPLY — 8 items
CATH INFINITI 5 FR JL3.5 (CATHETERS) IMPLANT
CATH INFINITI JR4 5F (CATHETERS) IMPLANT
DEVICE RAD COMP TR BAND LRG (VASCULAR PRODUCTS) IMPLANT
GLIDESHEATH SLEND SS 6F .021 (SHEATH) IMPLANT
GUIDEWIRE INQWIRE 1.5J.035X260 (WIRE) IMPLANT
PACK CARDIAC CATHETERIZATION (CUSTOM PROCEDURE TRAY) ×1 IMPLANT
SET ATX-X65L (MISCELLANEOUS) IMPLANT
SHEATH PROBE COVER 6X72 (BAG) IMPLANT
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
## 2023-10-01 NOTE — Discharge Summary (Signed)
 Discharge Summary   Patient ID: Clarence Hill MRN: [id number]; DOB: 29-Oct-1945  Admit date: 10/01/2023 Discharge date: 10/01/2023  PCP:  [name], MD   [facility] Providers Cardiologist:  Lamar Fitch, MD  Electrophysiologist:  Soyla Gladis Norton, MD    Discharge Diagnoses  Active Problems:   Atypical chest pain   Hypertension  Diagnostic Studies/Procedures   Left heart cath, 10/01/2023 performed by Dr. Swaziland   Prox LAD to [facility] LAD lesion is 20% stenosed.   LV end diastolic pressure is normal.   Mild coronary irregularity. No obstruction Normal LVEDP   Plan: risk factor modification _____________   History of Present Illness   Clarence Hill is a 78 y.o. male with prior LHC from 2015 that showed mild ostial LAD disease, PVCs, hypertension, intermittent complete heart block s/p pacemaker, history of TIA, AAA, GERD, BPH, gout who presented from [facility] to undergo cardiac catheterization on 10/01/2023.  Hospital Course   Transferred from [facility] hospital to [facility] hospital to undergo LHC. 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 He was seen by Dr. Swaziland post-cath and determined to be medically stable for discharge once his TR band was removed.   He already has follow up scheduled with Dr. Monetta    Did the patient have an acute coronary syndrome (MI, NSTEMI, STEMI, etc) this admission?:  No                               Did the patient have a percutaneous coronary intervention (stent / angioplasty)?:  No.    _____________  Discharge Vitals Blood pressure (!) 140/78, pulse (!) 0, resp. rate 16, SpO2 95%.  There were no vitals filed for this visit.  Labs & Radiologic Studies  CBC No results for input(s): WBC, NEUTROABS, HGB, HCT, MCV, PLT in the last 72 hours. Basic Metabolic Panel No results for input(s): NA, K, CL, CO2, GLUCOSE, BUN,  CREATININE, CALCIUM, MG, PHOS in the last 72 hours. Liver Function Tests No results for input(s): AST, ALT, ALKPHOS, BILITOT, PROT, ALBUMIN in the last 72 hours. No results for input(s): LIPASE, AMYLASE in the last 72 hours. High Sensitivity Troponin:   No results for input(s): TROPONINIHS in the last 720 hours.  No results for input(s): TRNPT in the last 720 hours.  BNP Invalid input(s): POCBNP No results for input(s): PROBNP in the last 72 hours.  No results for input(s): BNP in the last 72 hours.  D-Dimer No results for input(s): DDIMER in the last 72 hours. Hemoglobin A1C No results for input(s): HGBA1C in the last 72 hours. Fasting Lipid Panel No results for input(s): CHOL, HDL, LDLCALC, TRIG, CHOLHDL, LDLDIRECT in the last 72 hours. No results found for: LIPOA  Thyroid  Function Tests No results for input(s): TSH, T4TOTAL, T3FREE, THYROIDAB in the last 72 hours.  Invalid input(s): FREET3 _____________  CARDIAC CATHETERIZATION Result Date: 10/01/2023   Prox LAD to Mid LAD lesion is 20% stenosed.   LV end diastolic pressure is normal. Mild coronary irregularity. No obstruction Normal LVEDP Plan: risk factor modification   CUP PACEART REMOTE DEVICE CHECK Result Date: 09/11/2023 PPM Scheduled remote reviewed. Normal device function.  Presenting rhythm: AS/VS Next remote 91 days. LA, CVRS  Disposition Pt is being discharged home today in good condition per MD.  Follow-up Plans & Appointments  Discharge Instructions  Discharge instructions   Complete by: As directed    Radial Site Care Refer to this sheet in the next few weeks. These instructions provide you with information on caring for yourself after your procedure. Your caregiver may also give you more specific instructions. Your treatment has been planned according to current medical practices, but problems sometimes occur. Call your caregiver if you have any  problems or questions after your procedure.  HOME CARE INSTRUCTIONS You may shower the day after the procedure. Remove the bandage (dressing) and gently wash the site with plain soap and water . Gently pat the site dry.  Do not apply powder or lotion to the site.  Do not submerge the affected site in water  for 3 to 5 days.  Inspect the site at least twice daily.  Do not flex or bend the affected arm for 24 hours.  No lifting over 5 pounds (2.3 kg) for 5 days after your procedure.  Do not drive home if you are discharged the same day of the procedure. Have someone else drive you.  You may drive 24 hours after the procedure unless otherwise instructed by your caregiver.   What to expect: Any bruising will usually fade within 1 to 2 weeks.  Blood that collects in the tissue (hematoma) may be painful to the touch. It should usually decrease in size and tenderness within 1 to 2 weeks.   SEEK IMMEDIATE MEDICAL CARE IF: You have unusual pain at the radial site.  You have redness, warmth, swelling, or pain at the radial site.  You have drainage (other than a small amount of blood on the dressing).  You have chills.  You have a fever or persistent symptoms for more than 72 hours.  You have a fever and your symptoms suddenly get worse.  Your arm becomes pale, cool, tingly, or numb.  You have heavy bleeding from the site. Hold pressure on the site.      Future Appointments  Date Time Provider Department Center  10/07/2023  4:20 PM Bernie Lamar PARAS, MD [facility] None  10/13/2023 11:00 AM [facility] HVT PRE OP CLEARANCE APP [facility] H&V  11/03/2023  4:15 PM Camnitz, Soyla Lunger, MD [facility] None  12/10/2023  7:00 AM CVD HVT DEVICE REMOTES [facility] H&V  03/10/2024  7:00 AM [facility] HVT DEVICE REMOTES [facility] H&V  06/09/2024  7:00 AM CVD HVT DEVICE REMOTES [facility] H&V  09/08/2024  7:00 AM CVD HVT DEVICE REMOTES [facility] H&V  09/30/2024  3:50 PM [facility]-ANNUAL WELLNESS VISIT [facility]   Discharge  Medications Allergies as of 10/01/2023       Reactions   Prednisone Hives, Rash   Made him feel crazy   Levofloxacin Other (See Comments)   Unknown reaction   Penicillins Other (See Comments)   Unknown reaction Childhood   Pepcid [famotidine] Cough   Penicillin G Benzathine & Proc Rash        Medication List     TAKE these medications    Allegra Hives 24HR 180 MG tablet Generic drug: fexofenadine Take 180 mg by mouth daily.   allopurinol  100 MG tablet Commonly known as: ZYLOPRIM  Take 1 tablet (100 mg total) by mouth daily.   amLODipine  10 MG tablet Commonly known as: NORVASC  Take 1 tablet (10 mg total) by mouth daily.   aspirin  EC 81 MG tablet Take 1 tablet (81 mg total) by mouth daily.   colchicine  0.6 MG tablet TAKE ONE TABLET BY MOUTH DAILY AS NEEDED DURING GOUT FLARE  cyanocobalamin  1000 MCG tablet Commonly known as: VITAMIN B12 Take 1,000 mcg by mouth daily.   diphenhydramine-acetaminophen  25-500 MG Tabs tablet Commonly known as: TYLENOL  PM Take 2 tablets by mouth at bedtime as needed (sleep).   doxycycline  100 MG tablet Commonly known as: VIBRA -TABS Take 1 tablet (100 mg total) by mouth 2 (two) times daily.   doxylamine (Sleep) 25 MG tablet Commonly known as: UNISOM Take 25 mg by mouth at bedtime.   methocarbamol  500 MG tablet Commonly known as: ROBAXIN  Take 500 mg by mouth daily.   metoprolol  succinate 100 MG 24 hr tablet Commonly known as: TOPROL -XL Take 1 tablet (100 mg total) by mouth daily. Take with or immediately following a meal.   multivitamin with minerals tablet Take 1 tablet by mouth daily.   pantoprazole  40 MG tablet Commonly known as: PROTONIX  Take 1 tablet (40 mg total) by mouth daily.   pravastatin  10 MG tablet Commonly known as: PRAVACHOL  Take 1 tablet (10 mg total) by mouth daily.   zolpidem  10 MG tablet Commonly known as: AMBIEN  TAKE ONE TABLET BY MOUTH ONCE DAILY AT BEDTIME AS NEEDED FOR SLEEP          Outstanding Labs/Studies N/A  Duration of Discharge Encounter: APP Time: 10 minutes   Signed, Waddell DELENA Donath, PA-C 10/01/2023, 2:53 PM

## 2023-10-01 NOTE — Progress Notes (Signed)
 PERIOPERATIVE PRESCRIPTION FOR IMPLANTED CARDIAC DEVICE PROGRAMMING   Patient Information:  Patient: Clarence Hill  MRN: [id number]  Date of Birth: 10-16-1945     Procedure:  L3-5 Lumbar Laminectomy Revision   Date of Surgery:  Clearance 10/20/23                                 Surgeon:  Dr. Waddell Lager Surgeon's Group or Practice Name:  [facility] Phone number:  [phone] Fax number:  [phone]  Device Information:   Clinic EP Physician:   Dr. Soyla Norton Device Type:  Pacemaker Manufacturer and Phone #:  Medtronic: [phone] Pacemaker Dependent?:  No Date of Last Device Check:  09/09/2023         Normal Device Function?:  Yes     Electrophysiologist's Recommendations:   Have magnet available. Provide continuous ECG monitoring when magnet is used or reprogramming is to be performed.  Procedure may interfere with device function.  Magnet should be placed over device during procedure.  Per Device Clinic Standing Orders, Clarence Hill  10/01/2023 4:14 PM

## 2023-10-01 NOTE — Care Management Obs Status (Signed)
 MEDICARE OBSERVATION STATUS NOTIFICATION   Patient Details  Name: Clarence Hill MRN: [id number] Date of Birth: 30-Apr-1945   Medicare Observation Status Notification Given:  Yes    Vonzell Arrie Sharps 10/01/2023, 3:46 PM

## 2023-10-01 NOTE — Interval H&P Note (Signed)
 History and Physical Interval Note:  10/01/2023 1:54 PM  Saleh Ulbrich  has presented today for surgery, with the diagnosis of ACS.  The various methods of treatment have been discussed with the patient and family. After consideration of risks, benefits and other options for treatment, the patient has consented to  Procedure(s): LEFT HEART CATH AND CORONARY ANGIOGRAPHY (N/A) as a surgical intervention.  The patient's history has been reviewed, patient examined, no change in status, stable for surgery.  I have reviewed the patient's chart and labs.  Questions were answered to the patient's satisfaction.    Cath Lab Visit (complete for each Cath Lab visit)  Clinical Evaluation Leading to the Procedure:   ACS: Yes.    Non-ACS:    Anginal Classification: CCS III  Anti-ischemic medical therapy: Minimal Therapy (1 class of medications)  Non-Invasive Test Results: Intermediate-risk stress test findings: cardiac mortality 1-3%/year  Prior CABG: No previous CABG       [facility] 10/01/2023 1:54 PM

## 2023-10-02 ENCOUNTER — Encounter (HOSPITAL_COMMUNITY): Payer: Self-pay | Admitting: Cardiology

## 2023-10-06 DIAGNOSIS — M1990 Unspecified osteoarthritis, unspecified site: Secondary | ICD-10-CM | POA: Insufficient documentation

## 2023-10-06 DIAGNOSIS — H532 Diplopia: Secondary | ICD-10-CM | POA: Insufficient documentation

## 2023-10-06 DIAGNOSIS — J189 Pneumonia, unspecified organism: Secondary | ICD-10-CM | POA: Insufficient documentation

## 2023-10-06 DIAGNOSIS — E785 Hyperlipidemia, unspecified: Secondary | ICD-10-CM | POA: Insufficient documentation

## 2023-10-06 DIAGNOSIS — H269 Unspecified cataract: Secondary | ICD-10-CM | POA: Insufficient documentation

## 2023-10-07 ENCOUNTER — Ambulatory Visit: Attending: Cardiology | Admitting: Cardiology

## 2023-10-07 ENCOUNTER — Encounter: Payer: Self-pay | Admitting: Cardiology

## 2023-10-07 VITALS — BP 130/76 | HR 70 | Ht 72.0 in | Wt 244.0 lb

## 2023-10-07 DIAGNOSIS — E782 Mixed hyperlipidemia: Secondary | ICD-10-CM

## 2023-10-07 DIAGNOSIS — M48061 Spinal stenosis, lumbar region without neurogenic claudication: Secondary | ICD-10-CM

## 2023-10-07 DIAGNOSIS — I700 Atherosclerosis of aorta: Secondary | ICD-10-CM | POA: Diagnosis not present

## 2023-10-07 DIAGNOSIS — I442 Atrioventricular block, complete: Secondary | ICD-10-CM

## 2023-10-07 DIAGNOSIS — Z950 Presence of cardiac pacemaker: Secondary | ICD-10-CM | POA: Diagnosis not present

## 2023-10-07 DIAGNOSIS — I7121 Aneurysm of the ascending aorta, without rupture: Secondary | ICD-10-CM | POA: Diagnosis not present

## 2023-10-07 DIAGNOSIS — R1011 Right upper quadrant pain: Secondary | ICD-10-CM

## 2023-10-07 NOTE — Patient Instructions (Addendum)
 Medication Instructions:  Your physician recommends that you continue on your current medications as directed. Please refer to the Current Medication list given to you today.  *If you need a refill on your cardiac medications before your next appointment, please call your pharmacy*   Lab Work: None Ordered If you have labs (blood work) drawn today and your tests are completely normal, you will receive your results only by: MyChart Message (if you have MyChart) OR A paper copy in the mail If you have any lab test that is abnormal or we need to change your treatment, we will call you to review the results.   Testing/Procedures:  Gallbladder US   [facility] [address] [place], [place] [address] [phone]   Follow-Up: At [facility], you and your health needs are our priority.  As part of our continuing mission to provide you with exceptional heart care, we have created designated Provider Care Teams.  These Care Teams include your primary Cardiologist (physician) and Advanced Practice Providers (APPs -  Physician Assistants and Nurse Practitioners) who all work together to provide you with the care you need, when you need it.  We recommend signing up for the patient portal called MyChart.  Sign up information is provided on this After Visit Summary.  MyChart is used to connect with patients for Virtual Visits (Telemedicine).  Patients are able to view lab/test results, encounter notes, upcoming appointments, etc.  Non-urgent messages can be sent to your provider as well.   To learn more about what you can do with MyChart, go to [email or web address].    Your next appointment:   6 month(s)  The format for your next appointment:   In Person  Provider:   Lamar Fitch, MD    Other Instructions NA
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
## 2023-10-09 ENCOUNTER — Encounter: Payer: Self-pay | Admitting: Family Medicine

## 2023-10-10 ENCOUNTER — Inpatient Hospital Stay: Admitting: Family Medicine

## 2023-10-10 ENCOUNTER — Ambulatory Visit (INDEPENDENT_AMBULATORY_CARE_PROVIDER_SITE_OTHER)
Admission: RE | Admit: 2023-10-10 | Discharge: 2023-10-10 | Disposition: A | Discharge status: Home / Self Care | Source: Ambulatory Visit | Attending: Cardiology | Admitting: Cardiology

## 2023-10-10 DIAGNOSIS — R1011 Right upper quadrant pain: Secondary | ICD-10-CM

## 2023-10-13 ENCOUNTER — Ambulatory Visit

## 2023-10-13 DIAGNOSIS — Z01818 Encounter for other preprocedural examination: Secondary | ICD-10-CM | POA: Diagnosis not present
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
## 2023-10-13 NOTE — Telephone Encounter (Signed)
 Pt canceled TELE preop appt for today (10/13/23) stated that he was clearance when he saw Dr Bernie. In office visit was 10/07/23 with Dr Bernie.  I will route to the preop pool for the Preop APP to review.

## 2023-10-13 NOTE — Telephone Encounter (Signed)
 FYI - pt called in to cancel tele visit today. He states he saw Dr. Bernie last week and he was cleared for surgery.

## 2023-10-20 DIAGNOSIS — I10 Essential (primary) hypertension: Secondary | ICD-10-CM | POA: Diagnosis not present

## 2023-10-20 DIAGNOSIS — M5416 Radiculopathy, lumbar region: Secondary | ICD-10-CM | POA: Diagnosis not present

## 2023-10-20 DIAGNOSIS — I4891 Unspecified atrial fibrillation: Secondary | ICD-10-CM | POA: Diagnosis not present

## 2023-10-20 DIAGNOSIS — M545 Low back pain, unspecified: Secondary | ICD-10-CM | POA: Diagnosis not present

## 2023-10-20 DIAGNOSIS — K219 Gastro-esophageal reflux disease without esophagitis: Secondary | ICD-10-CM | POA: Diagnosis not present

## 2023-10-20 DIAGNOSIS — M48061 Spinal stenosis, lumbar region without neurogenic claudication: Secondary | ICD-10-CM | POA: Diagnosis not present

## 2023-10-20 DIAGNOSIS — Z79899 Other long term (current) drug therapy: Secondary | ICD-10-CM | POA: Diagnosis not present

## 2023-10-20 DIAGNOSIS — R2689 Other abnormalities of gait and mobility: Secondary | ICD-10-CM | POA: Diagnosis not present

## 2023-10-20 DIAGNOSIS — Z888 Allergy status to other drugs, medicaments and biological substances status: Secondary | ICD-10-CM | POA: Diagnosis not present

## 2023-10-20 DIAGNOSIS — Z6831 Body mass index (BMI) 31.0-31.9, adult: Secondary | ICD-10-CM | POA: Diagnosis not present

## 2023-10-20 DIAGNOSIS — Z950 Presence of cardiac pacemaker: Secondary | ICD-10-CM | POA: Diagnosis not present

## 2023-10-20 DIAGNOSIS — M7138 Other bursal cyst, other site: Secondary | ICD-10-CM | POA: Diagnosis not present

## 2023-10-20 DIAGNOSIS — Z87891 Personal history of nicotine dependence: Secondary | ICD-10-CM | POA: Diagnosis not present

## 2023-10-20 DIAGNOSIS — Z880 Allergy status to penicillin: Secondary | ICD-10-CM | POA: Diagnosis not present

## 2023-10-20 DIAGNOSIS — M5116 Intervertebral disc disorders with radiculopathy, lumbar region: Secondary | ICD-10-CM | POA: Diagnosis not present

## 2023-10-20 DIAGNOSIS — Z7982 Long term (current) use of aspirin: Secondary | ICD-10-CM | POA: Diagnosis not present

## 2023-10-20 DIAGNOSIS — E669 Obesity, unspecified: Secondary | ICD-10-CM | POA: Diagnosis not present

## 2023-10-20 DIAGNOSIS — M6281 Muscle weakness (generalized): Secondary | ICD-10-CM | POA: Diagnosis not present

## 2023-10-21 ENCOUNTER — Ambulatory Visit: Payer: Self-pay | Admitting: Cardiology

## 2023-10-21 DIAGNOSIS — R2689 Other abnormalities of gait and mobility: Secondary | ICD-10-CM | POA: Diagnosis not present

## 2023-10-21 DIAGNOSIS — M6281 Muscle weakness (generalized): Secondary | ICD-10-CM | POA: Diagnosis not present

## 2023-10-21 DIAGNOSIS — M5116 Intervertebral disc disorders with radiculopathy, lumbar region: Secondary | ICD-10-CM | POA: Diagnosis not present

## 2023-10-21 DIAGNOSIS — Z888 Allergy status to other drugs, medicaments and biological substances status: Secondary | ICD-10-CM | POA: Diagnosis not present

## 2023-10-21 DIAGNOSIS — M48061 Spinal stenosis, lumbar region without neurogenic claudication: Secondary | ICD-10-CM | POA: Diagnosis not present

## 2023-10-21 DIAGNOSIS — Z87891 Personal history of nicotine dependence: Secondary | ICD-10-CM | POA: Diagnosis not present

## 2023-10-21 DIAGNOSIS — I10 Essential (primary) hypertension: Secondary | ICD-10-CM | POA: Diagnosis not present

## 2023-10-21 DIAGNOSIS — Z6831 Body mass index (BMI) 31.0-31.9, adult: Secondary | ICD-10-CM | POA: Diagnosis not present

## 2023-10-21 DIAGNOSIS — Z880 Allergy status to penicillin: Secondary | ICD-10-CM | POA: Diagnosis not present

## 2023-10-21 DIAGNOSIS — Z7982 Long term (current) use of aspirin: Secondary | ICD-10-CM | POA: Diagnosis not present

## 2023-10-21 DIAGNOSIS — Z79899 Other long term (current) drug therapy: Secondary | ICD-10-CM | POA: Diagnosis not present

## 2023-10-21 DIAGNOSIS — E669 Obesity, unspecified: Secondary | ICD-10-CM | POA: Diagnosis not present

## 2023-10-21 DIAGNOSIS — I4891 Unspecified atrial fibrillation: Secondary | ICD-10-CM | POA: Diagnosis not present

## 2023-10-21 DIAGNOSIS — M7138 Other bursal cyst, other site: Secondary | ICD-10-CM | POA: Diagnosis not present

## 2023-10-21 DIAGNOSIS — K219 Gastro-esophageal reflux disease without esophagitis: Secondary | ICD-10-CM | POA: Diagnosis not present

## 2023-10-21 DIAGNOSIS — M5416 Radiculopathy, lumbar region: Secondary | ICD-10-CM | POA: Diagnosis not present

## 2023-10-21 DIAGNOSIS — M545 Low back pain, unspecified: Secondary | ICD-10-CM | POA: Diagnosis not present

## 2023-10-21 DIAGNOSIS — Z950 Presence of cardiac pacemaker: Secondary | ICD-10-CM | POA: Diagnosis not present

## 2023-10-23 NOTE — Progress Notes (Signed)
 Remote PPM Transmission

## 2023-10-24 ENCOUNTER — Telehealth: Payer: Self-pay

## 2023-10-24 NOTE — Telephone Encounter (Signed)
 Left message on My Chart with normal results per Dr. Karry note. Routed to PCP.
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
## 2023-11-03 ENCOUNTER — Encounter: Payer: Self-pay | Admitting: Cardiology

## 2023-11-03 ENCOUNTER — Ambulatory Visit: Payer: Self-pay | Admitting: Cardiology

## 2023-11-03 ENCOUNTER — Ambulatory Visit: Attending: Cardiology | Admitting: Cardiology

## 2023-11-03 ENCOUNTER — Ambulatory Visit (INDEPENDENT_AMBULATORY_CARE_PROVIDER_SITE_OTHER): Admitting: Family Medicine

## 2023-11-03 ENCOUNTER — Encounter: Payer: Self-pay | Admitting: Family Medicine

## 2023-11-03 VITALS — BP 136/72 | HR 60 | Ht 72.0 in | Wt 244.2 lb

## 2023-11-03 VITALS — BP 124/72 | HR 84 | Temp 97.9°F | Ht 72.0 in | Wt 244.0 lb

## 2023-11-03 DIAGNOSIS — I442 Atrioventricular block, complete: Secondary | ICD-10-CM | POA: Diagnosis not present

## 2023-11-03 DIAGNOSIS — I700 Atherosclerosis of aorta: Secondary | ICD-10-CM | POA: Diagnosis not present

## 2023-11-03 DIAGNOSIS — Z23 Encounter for immunization: Secondary | ICD-10-CM | POA: Diagnosis not present

## 2023-11-03 DIAGNOSIS — Z9889 Other specified postprocedural states: Secondary | ICD-10-CM

## 2023-11-03 DIAGNOSIS — I10 Essential (primary) hypertension: Secondary | ICD-10-CM

## 2023-11-03 DIAGNOSIS — K8020 Calculus of gallbladder without cholecystitis without obstruction: Secondary | ICD-10-CM

## 2023-11-03 LAB — CUP PACEART INCLINIC DEVICE CHECK
Date Time Interrogation Session: 20251006164306
Implantable Lead Connection Status: 753985
Implantable Lead Connection Status: 753985
Implantable Lead Implant Date: 20231114
Implantable Lead Implant Date: 20231114
Implantable Lead Location: 753859
Implantable Lead Location: 753860
Implantable Lead Model: 3830
Implantable Lead Model: 5076
Implantable Pulse Generator Implant Date: 20231114

## 2023-11-03 NOTE — Progress Notes (Signed)
  Electrophysiology Office Note:   Date:  11/03/2023  ID:  Clarence Hill, DOB 09/06/1945, MRN [id number]  Primary Cardiologist: Lamar Fitch, MD Primary Heart Failure: None Electrophysiologist: Safi Culotta Gladis Norton, MD      History of Present Illness:   Clarence Hill is a 78 y.o. male with h/o PVCs, hypertension, TIA, intermittent heart block seen today for routine electrophysiology followup.   Since last being seen in our clinic the patient reports doing well.  He has no chest pain or shortness of breath.  He is able to do all of his daily activities.  He is restricted by back pain from his recent laminectomy and cystectomy.  He has no cardiac complaints.  he denies chest pain, palpitations, dyspnea, PND, orthopnea, nausea, vomiting, dizziness, syncope, edema, weight gain, or early satiety.   Review of systems complete and found to be negative unless listed in HPI.      EP Information / Studies Reviewed:    EKG is not ordered today. EKG from 925 reviewed which showed sinus rhythm      PPM Interrogation-  reviewed in detail today,  See PACEART report.  Device History: Medtronic Dual Chamber PPM implanted 12/11/2021 for intermittent complete heart block  Risk Assessment/Calculations:           Physical Exam:   VS:  BP 136/72   Pulse 60   Ht 6' (1.829 m)   Wt 244 lb 3.2 oz (110.8 kg)   SpO2 99%   BMI 33.12 kg/m    Wt Readings from Last 3 Encounters:  11/03/23 244 lb 3.2 oz (110.8 kg)  11/03/23 244 lb (110.7 kg)  10/07/23 244 lb (110.7 kg)     GEN: Well nourished, well developed in no acute distress NECK: No JVD; No carotid bruits CARDIAC: Regular rate and rhythm, no murmurs, rubs, gallops RESPIRATORY:  Clear to auscultation without rales, wheezing or rhonchi  ABDOMEN: Soft, non-tender, non-distended EXTREMITIES:  No edema; No deformity   ASSESSMENT AND PLAN:    CHB s/p Medtronic PPM  Normal PPM function See Pace Art report Ventricular threshold remains  elevated.  He rarely ventricular paces.  Of increased outputs.  2.  Hypertension: Well-controlled  3.  TIA: Monitoring for atrial fibrillation through pacemaker.  Disposition:   Follow up with EP Team in 12 months  Signed, Gerardine Peltz Gladis Norton, MD

## 2023-11-03 NOTE — Patient Instructions (Signed)
  VISIT SUMMARY: You had a follow-up visit to discuss your recovery from back surgery and evaluate your gallstones. We also reviewed an episode of chest pain you experienced recently.  YOUR PLAN: GALLSTONES: Gallstones were found on your ultrasound, but you do not have any symptoms right now. -Monitor for any symptoms of gallbladder attacks, such as abdominal pain, nausea, or vomiting. -Follow a gallbladder-friendly diet and avoid fried foods. -No immediate surgery is needed unless symptoms develop.  POSTOPERATIVE SPINAL PAIN AND RECOVERY: You are recovering well from your recent back surgery, with some localized pain remaining. -Continue to follow up with your back surgeon as scheduled.  CORONARY ARTERY ATHEROSCLEROSIS, NON-OBSTRUCTIVE: You have non-obstructive coronary artery atherosclerosis with 20% stenosis, but no significant chest pain since the initial episode. -Monitor for any recurrence of chest pain or related symptoms.  GENERAL HEALTH MAINTENANCE: You are due for a flu vaccination and need to verify your shingles vaccination status. -You will receive your flu shot today. -Check with your pharmacy to verify your shingles vaccination status. -If you are due for the shingles vaccine, obtain it at the pharmacy.                      Contains text generated by [employer]bridge.                                 Contains text generated by Abridge.

## 2023-11-05 DIAGNOSIS — K8020 Calculus of gallbladder without cholecystitis without obstruction: Secondary | ICD-10-CM | POA: Insufficient documentation

## 2023-11-05 DIAGNOSIS — Z9889 Other specified postprocedural states: Secondary | ICD-10-CM | POA: Insufficient documentation

## 2023-11-05 NOTE — Assessment & Plan Note (Signed)
 Gallstones identified on ultrasound without symptoms. No immediate surgical intervention required. Discussed potential symptoms and possibility of stones dissolving over time. - Monitor for symptoms of gallbladder attacks. - Follow a gallbladder-friendly diet, avoiding fried foods. - Avoid immediate surgical intervention unless symptoms develop.

## 2023-11-05 NOTE — Assessment & Plan Note (Signed)
 Status post spinal cyst removal and laminectomy revision Post-operative recovery positive with localized pain. Both neurosurgeon and orthopedic surgeon agreed on cyst removal necessity. - Follow up with back surgeon as scheduled.

## 2023-11-05 NOTE — Assessment & Plan Note (Signed)
 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 No significant chest pain recurrence. No immediate cardiac intervention necessary. - Monitor for any recurrence of chest pain or related symptoms.

## 2023-11-25 ENCOUNTER — Telehealth: Payer: Self-pay

## 2023-11-25 ENCOUNTER — Other Ambulatory Visit: Payer: Self-pay | Admitting: Family Medicine

## 2023-11-25 NOTE — Telephone Encounter (Signed)
 Copied from CRM #[id number]. Topic: Clinical - Medication Question >> Nov 25, 2023 12:36 PM Lonell PEDLAR wrote: Reason for CRM: Patient is requesting an rx refill for methocarbamol  (ROBAXIN ) 500 MG tablet, sent to [facility]. Dr. Debby was the ordering provider for this medication, but this patient does not see that provider anymore. He is requesting Dr. Sherre call this in for him going forward.

## 2023-11-25 NOTE — Telephone Encounter (Signed)
 Sent. Dr. Sherre

## 2023-11-25 NOTE — Telephone Encounter (Signed)
 Copied from CRM #[id number]. Topic: Clinical - Medication Refill >> Nov 25, 2023 12:37 PM Lonell PEDLAR wrote: Medication:  allopurinol  (ZYLOPRIM ) 100 MG tablet, 90 day supply  Has the patient contacted their pharmacy? Yes, sent to wrong pharmacy   This is the patient's preferred pharmacy:   [facility] [address] - [place], [place] - [address] [place] [address] Phone: [phone] Fax: [phone]   Is this the correct pharmacy for this prescription? Yes If no, delete pharmacy and type the correct one.   Has the prescription been filled recently? Yes  Is the patient out of the medication? Yes  Has the patient been seen for an appointment in the last year OR does the patient have an upcoming appointment? Yes  Can we respond through MyChart? Yes  Agent: Please be advised that Rx refills may take up to 3 business days. We ask that you follow-up with your pharmacy.

## 2023-11-26 ENCOUNTER — Other Ambulatory Visit: Payer: Self-pay

## 2023-11-27 DIAGNOSIS — N5201 Erectile dysfunction due to arterial insufficiency: Secondary | ICD-10-CM | POA: Diagnosis not present

## 2023-11-27 DIAGNOSIS — R399 Unspecified symptoms and signs involving the genitourinary system: Secondary | ICD-10-CM | POA: Diagnosis not present

## 2023-12-10 ENCOUNTER — Ambulatory Visit: Payer: Self-pay

## 2023-12-10 DIAGNOSIS — I442 Atrioventricular block, complete: Secondary | ICD-10-CM | POA: Diagnosis not present

## 2023-12-11 LAB — CUP PACEART REMOTE DEVICE CHECK
Battery Remaining Longevity: 159 mo
Battery Voltage: 3.04 V
Brady Statistic AP VP Percent: 0 %
Brady Statistic AP VS Percent: 1.76 %
Brady Statistic AS VP Percent: 0 %
Brady Statistic AS VS Percent: 98.24 %
Brady Statistic RA Percent Paced: 1.76 %
Brady Statistic RV Percent Paced: 0 %
Date Time Interrogation Session: 20251113125852
Implantable Lead Connection Status: 753985
Implantable Lead Connection Status: 753985
Implantable Lead Implant Date: 20231114
Implantable Lead Implant Date: 20231114
Implantable Lead Location: 753859
Implantable Lead Location: 753860
Implantable Lead Model: 3830
Implantable Lead Model: 5076
Implantable Pulse Generator Implant Date: 20231114
Lead Channel Impedance Value: 285 Ohm
Lead Channel Impedance Value: 304 Ohm
Lead Channel Impedance Value: 323 Ohm
Lead Channel Impedance Value: 646 Ohm
Lead Channel Pacing Threshold Amplitude: 0.5 V
Lead Channel Pacing Threshold Amplitude: 1.125 V
Lead Channel Pacing Threshold Pulse Width: 0.4 ms
Lead Channel Pacing Threshold Pulse Width: 0.4 ms
Lead Channel Sensing Intrinsic Amplitude: 3 mV
Lead Channel Sensing Intrinsic Amplitude: 3 mV
Lead Channel Sensing Intrinsic Amplitude: 3.75 mV
Lead Channel Sensing Intrinsic Amplitude: 3.75 mV
Lead Channel Setting Pacing Amplitude: 2 V
Lead Channel Setting Pacing Amplitude: 8 V
Lead Channel Setting Pacing Pulse Width: 1 ms
Lead Channel Setting Sensing Sensitivity: 1.2 mV
Zone Setting Status: 755011

## 2023-12-16 NOTE — Progress Notes (Signed)
 Remote PPM Transmission

## 2023-12-17 ENCOUNTER — Ambulatory Visit: Payer: Self-pay | Admitting: Cardiology

## 2023-12-19 DIAGNOSIS — M62830 Muscle spasm of back: Secondary | ICD-10-CM | POA: Diagnosis not present

## 2023-12-19 DIAGNOSIS — N1831 Chronic kidney disease, stage 3a: Secondary | ICD-10-CM | POA: Diagnosis not present

## 2023-12-23 ENCOUNTER — Ambulatory Visit: Payer: Self-pay
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
## 2023-12-24 ENCOUNTER — Ambulatory Visit (INDEPENDENT_AMBULATORY_CARE_PROVIDER_SITE_OTHER): Admitting: Family Medicine

## 2023-12-24 ENCOUNTER — Encounter (HOSPITAL_BASED_OUTPATIENT_CLINIC_OR_DEPARTMENT_OTHER): Payer: Self-pay | Admitting: Family Medicine

## 2023-12-24 VITALS — BP 134/84 | HR 66 | Temp 98.8°F | Resp 16 | Wt 244.2 lb

## 2023-12-24 DIAGNOSIS — J209 Acute bronchitis, unspecified: Secondary | ICD-10-CM | POA: Insufficient documentation

## 2023-12-24 MED ORDER — AZITHROMYCIN 250 MG PO TABS: ORAL_TABLET | ORAL | 0 refills | Status: AC

## 2023-12-24 MED ORDER — BENZONATATE 200 MG PO CAPS: 200.0000 mg | ORAL_CAPSULE | Freq: Three times a day (TID) | ORAL | 0 refills | Status: AC | PRN

## 2023-12-24 NOTE — Assessment & Plan Note (Signed)
 Consider Pepcid AC at hs if any question about GERD.  Alert us  if not better in 3-4 days.

## 2023-12-24 NOTE — Progress Notes (Signed)
 Established Patient Office Visit  Subjective   Patient ID: Clarence Hill, male    DOB: [birth year]  Age: 78 y.o. MRN: [id number]  Chief Complaint  Patient presents with   Cough    Cough    F/u as above.  Has been coughing for the past 2 weeks, especially at night.  Has had some atypical chest pain that is clearly exacerbated by his coughing.  No fevers or sinus congestion.  Perhaps some GERD issues but not bad.  Allergies seem controlled.  No recent antibiotics.  Cough is not productive.  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   Double vision    Elevated serum creatinine 10/22/2021   Gastroesophageal reflux disease 05/19/2015   Heat exhaustion 08/14/2021   History of kidney stones 2010   Hyperlipidemia    Hypertension    Lumbar spinal stenosis 07/16/2022   Muscle cramps 02/17/2022   Normally functioning cardiac pacemaker present 04/08/2022   Pain in pelvis 09/18/2022   Palpitations 05/12/2017   Pneumonia    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    aspirin  EC 81 MG tablet Take 1 tablet (81 mg total) by mouth daily.   azithromycin  (ZITHROMAX ) 250 MG tablet Take 2 tablets on day 1, then 1 tablet daily on days 2 through 5   benzonatate  (TESSALON ) 200 MG capsule Take 1 capsule (200 mg total) by mouth 3 (three) times daily as needed for cough.   calcium carbonate (TUMS EX) 750 MG chewable tablet Chew 750 mg by mouth.   colchicine  0.6 MG tablet TAKE ONE TABLET BY MOUTH DAILY AS NEEDED DURING GOUT FLARE   diphenhydramine-acetaminophen  (TYLENOL  PM) 25-500 MG TABS tablet Take 2 tablets by mouth at bedtime as needed (sleep).   doxycycline  (VIBRA -TABS) 100 MG tablet Take 1 tablet (100 mg total) by mouth 2 (two) times daily.   doxylamine, Sleep, (UNISOM) 25 MG tablet Take 25 mg by mouth at bedtime.   fexofenadine (ALLEGRA HIVES 24HR) 180 MG tablet Take 180 mg by mouth daily.   methocarbamol  (ROBAXIN ) 500 MG tablet TAKE ONE TABLET BY MOUTH EVERY 6 HOURS AS NEEDED FOR MUSCLE SPASMS   Multiple Vitamins-Minerals (MULTIVITAMIN WITH MINERALS) tablet Take 1 tablet by mouth daily.   pantoprazole  (PROTONIX ) 40 MG tablet Take 1 tablet (40 mg total) by mouth daily.   pravastatin  (PRAVACHOL ) 10 MG tablet Take 1 tablet (10 mg total) by mouth daily.   vitamin B-12 (CYANOCOBALAMIN ) 1000 MCG tablet Take 1,000 mcg by mouth daily.   zolpidem  (AMBIEN ) 10 MG tablet TAKE ONE TABLET  BY MOUTH ONCE DAILY AT BEDTIME AS NEEDED FOR SLEEP   metoprolol  succinate (TOPROL -XL) 100 MG 24 hr tablet Take 1 tablet (100 mg total) by mouth daily. Take with or immediately following a meal.   No facility-administered encounter medications on file as of 12/24/2023.    Social History   Tobacco Use   Smoking status: Former    Current packs/day: 0.00    Types: Cigarettes    Quit date: 1976    Years since quitting: 49.9   Smokeless tobacco: Never  Vaping Use   Vaping status: Never Used  Substance Use Topics   Alcohol use: Not Currently    Alcohol/week: 7.0 standard drinks of  alcohol    Types: 7 Glasses of wine per week   Drug use: Never      Review of Systems  Respiratory:  Positive for cough.       Objective:     BP 134/84 (BP Location: Right Arm, Patient Position: Standing, Cuff Size: Normal)   Pulse 66   Temp 98.8 F (37.1 C) (Oral)   Resp 16   Wt 244 lb 3.2 oz (110.8 kg)   SpO2 91%   BMI 33.12 kg/m    Physical Exam Constitutional:      General: He is not in acute distress.    Appearance: He is not ill-appearing, toxic-appearing or diaphoretic.     Comments: Nontoxic, nondyspneic.  Mildly ill appearing.  HENT:     Right Ear: Tympanic membrane normal.     Left Ear: Tympanic membrane normal.     Nose: No congestion or rhinorrhea.     Right Sinus: No maxillary sinus tenderness or frontal sinus tenderness.     Left Sinus: No maxillary sinus tenderness or frontal sinus tenderness.     Mouth/Throat:     Pharynx: Posterior oropharyngeal erythema present. No oropharyngeal exudate.  Eyes:     Conjunctiva/sclera: Conjunctivae normal.  Cardiovascular:     Rate and Rhythm: Normal rate and regular rhythm.     Heart sounds: Normal heart sounds.  Pulmonary:     Breath sounds: Normal breath sounds.  Abdominal:     Palpations: Abdomen is soft.     Tenderness: There is no abdominal tenderness.  Musculoskeletal:     Cervical back: Normal range of motion and neck supple. No tenderness.  Lymphadenopathy:     Cervical: No cervical adenopathy.  Skin:    General: Skin is warm and dry.     Findings: No rash.  Neurological:     Mental Status: He is alert.      No results found for any visits on 12/24/23.    The ASCVD Risk score (Arnett DK, et al., 2019) failed to calculate for the following reasons:   The valid total cholesterol range is 130 to 320 mg/dL    Assessment & Plan:  Subacute bronchitis Assessment & Plan: Consider Pepcid AC at hs if any question about GERD.  Alert us  if not better in 3-4 days.  Orders: -     Azithromycin ;  Take 2 tablets on day 1, then 1 tablet daily on days 2 through 5  Dispense: 6 tablet; Refill: 0 -     Benzonatate ; Take 1 capsule (200 mg total) by mouth 3 (three) times daily as needed for cough.  Dispense: 20 capsule; Refill: 0    No follow-ups on file.    REDDING PONCE NORLEEN FALCON., MD

## 2024-01-12 ENCOUNTER — Other Ambulatory Visit: Payer: Self-pay

## 2024-01-13 MED ORDER — ZOLPIDEM TARTRATE 10 MG PO TABS: 10.0000 mg | ORAL_TABLET | Freq: Every evening | ORAL | 2 refills | Status: AC | PRN

## 2024-01-30 ENCOUNTER — Ambulatory Visit: Payer: Self-pay

## 2024-01-30 ENCOUNTER — Ambulatory Visit (HOSPITAL_BASED_OUTPATIENT_CLINIC_OR_DEPARTMENT_OTHER): Payer: Self-pay | Admitting: Family Medicine

## 2024-01-30 ENCOUNTER — Encounter (HOSPITAL_BASED_OUTPATIENT_CLINIC_OR_DEPARTMENT_OTHER): Payer: Self-pay

## 2024-01-30 ENCOUNTER — Ambulatory Visit (HOSPITAL_BASED_OUTPATIENT_CLINIC_OR_DEPARTMENT_OTHER): Admitting: Radiology

## 2024-01-30 ENCOUNTER — Ambulatory Visit (HOSPITAL_BASED_OUTPATIENT_CLINIC_OR_DEPARTMENT_OTHER)
Admission: EM | Admit: 2024-01-30 | Discharge: 2024-01-30 | Disposition: A | Discharge status: Home / Self Care | Attending: Family Medicine | Admitting: Family Medicine

## 2024-01-30 DIAGNOSIS — R0789 Other chest pain: Secondary | ICD-10-CM

## 2024-01-30 NOTE — ED Triage Notes (Signed)
 States had a vigorous cough several weeks ago. Feels like he either broke a rib or possible connective tissue injury. Pain with certain movements.

## 2024-01-30 NOTE — Discharge Instructions (Addendum)
 I will send the results to my chart once we receive them.

## 2024-01-30 NOTE — Telephone Encounter (Signed)
 FYI Only or Action Required?: FYI only for provider: pt going to urgent care today.  Patient was last seen in primary care on 12/24/2023 by Dottie Norleen PHEBE PONCE, MD.  Called Nurse Triage reporting Rib Injury.  Symptoms began several weeks ago.  Triage Disposition: See Physician Within 24 Hours  Patient/caregiver understands and will follow disposition?: Yes                      Copied from CRM [id number]. Topic: Clinical - Red Word Triage >> Jan 30, 2024  9:15 AM Ivette P wrote: Red Word that prompted transfer to Nurse Triage: had a cough and a cold about 4 weeks ago - coughing a whole lot. thought cracked a rib. let it go and tried not to sleep on that side. not getting any better. dont feel pain when touching ribs. there is a pain and hurts when bendover to get soemthing or sitting in chair whenever strianed has a pain.   unsure if has tendon, crack in rib, or something wrong with lung.  Pain level at a 7, not hurting the way sitting right now but will if moves a certain way. Reason for Disposition  [1] MODERATE pain (e.g., interferes with normal activities) AND [2] high-risk adult (e.g., age > 60 years, osteoporosis, chronic steroid use)  Answer Assessment - Initial Assessment Questions This RN recommends pt is seen within 24 hours. No appointment availability in office. Pt is going to urgent care today.  Pain 2 inches below nipple on right side, where ribs are 7/10 pain level when it hurts One month ago pt had a bad cough and he started having rib pain; pt wonders if he cracked a rib The cough has gone away now The ribs do not hurt with touch but pain when bending over, sitting up straight When sitting still no pain  Denies pain with breathing  Protocols used: Chest Injury - Bending, Lifting, or Twisting-A-AH
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
## 2024-02-16 ENCOUNTER — Other Ambulatory Visit: Payer: Self-pay

## 2024-02-16 MED ORDER — METHOCARBAMOL 500 MG PO TABS: 500.0000 mg | ORAL_TABLET | Freq: Four times a day (QID) | ORAL | 1 refills | Status: AC | PRN

## 2024-03-10 ENCOUNTER — Ambulatory Visit: Payer: Self-pay

## 2024-06-09 ENCOUNTER — Ambulatory Visit: Payer: Self-pay

## 2024-09-08 ENCOUNTER — Ambulatory Visit: Payer: Self-pay

## 2024-09-30 ENCOUNTER — Ambulatory Visit
